# Patient Record
Sex: Male | Born: 1945 | Race: White | Hispanic: No | Smoking: Former smoker
Health system: Southern US, Community
[De-identification: ages and names within clinical notes are randomized; demographics above are authoritative.]

## PROBLEM LIST (undated history)

## (undated) DIAGNOSIS — F32A Depression, unspecified: Secondary | ICD-10-CM

## (undated) DIAGNOSIS — E119 Type 2 diabetes mellitus without complications: Secondary | ICD-10-CM

## (undated) DIAGNOSIS — E039 Hypothyroidism, unspecified: Secondary | ICD-10-CM

## (undated) DIAGNOSIS — F329 Major depressive disorder, single episode, unspecified: Secondary | ICD-10-CM

## (undated) DIAGNOSIS — E785 Hyperlipidemia, unspecified: Secondary | ICD-10-CM

## (undated) DIAGNOSIS — R0602 Shortness of breath: Secondary | ICD-10-CM

## (undated) DIAGNOSIS — L089 Local infection of the skin and subcutaneous tissue, unspecified: Secondary | ICD-10-CM

## (undated) DIAGNOSIS — I10 Essential (primary) hypertension: Secondary | ICD-10-CM

## (undated) DIAGNOSIS — G4730 Sleep apnea, unspecified: Secondary | ICD-10-CM

## (undated) HISTORY — PX: KNEE ARTHROSCOPY: SUR90

## (undated) HISTORY — PX: ROTATOR CUFF REPAIR: SHX139

---
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
## 1999-03-07 ENCOUNTER — Emergency Department (HOSPITAL_COMMUNITY): Admission: EM | Admit: 1999-03-07 | Discharge: 1999-03-07 | Payer: Self-pay | Admitting: Emergency Medicine

## 1999-03-07 ENCOUNTER — Encounter: Payer: Self-pay | Admitting: Emergency Medicine

## 2003-03-10 ENCOUNTER — Ambulatory Visit: Admission: RE | Admit: 2003-03-10 | Discharge: 2003-03-10 | Payer: Self-pay | Admitting: Pulmonary Disease

## 2003-05-01 ENCOUNTER — Ambulatory Visit (HOSPITAL_COMMUNITY): Admission: RE | Admit: 2003-05-01 | Discharge: 2003-05-01 | Payer: Self-pay | Admitting: Cardiovascular Disease

## 2004-10-09 ENCOUNTER — Inpatient Hospital Stay (HOSPITAL_COMMUNITY): Admission: EM | Admit: 2004-10-09 | Discharge: 2004-10-10 | Payer: Self-pay | Admitting: Emergency Medicine

## 2004-10-10 ENCOUNTER — Ambulatory Visit: Payer: Self-pay | Admitting: Cardiology

## 2012-11-14 ENCOUNTER — Encounter (HOSPITAL_COMMUNITY): Payer: Self-pay | Admitting: Pharmacy Technician

## 2012-11-19 ENCOUNTER — Other Ambulatory Visit: Payer: Self-pay

## 2012-11-19 ENCOUNTER — Encounter (HOSPITAL_COMMUNITY)
Admission: RE | Admit: 2012-11-19 | Discharge: 2012-11-19 | Disposition: A | Discharge status: Home / Self Care | Payer: Medicare Other | Source: Ambulatory Visit | Attending: Ophthalmology | Admitting: Ophthalmology

## 2012-11-19 ENCOUNTER — Encounter (HOSPITAL_COMMUNITY): Payer: Self-pay

## 2012-11-19 HISTORY — DX: Sleep apnea, unspecified: G47.30

## 2012-11-19 HISTORY — DX: Hypothyroidism, unspecified: E03.9

## 2012-11-19 HISTORY — DX: Essential (primary) hypertension: I10

## 2012-11-19 HISTORY — DX: Major depressive disorder, single episode, unspecified: F32.9

## 2012-11-19 HISTORY — DX: Depression, unspecified: F32.A

## 2012-11-19 HISTORY — DX: Hyperlipidemia, unspecified: E78.5

## 2012-11-19 HISTORY — DX: Shortness of breath: R06.02

## 2012-11-19 HISTORY — DX: Type 2 diabetes mellitus without complications: E11.9

## 2012-11-19 LAB — BASIC METABOLIC PANEL
CO2: 26 mEq/L (ref 19–32)
Chloride: 101 mEq/L (ref 96–112)
Sodium: 138 mEq/L (ref 135–145)

## 2012-11-19 LAB — HEMOGLOBIN AND HEMATOCRIT, BLOOD: HCT: 39.1 % (ref 39.0–52.0)

## 2012-11-19 NOTE — Patient Instructions (Signed)
Your procedure is scheduled on:  11/21/12  Report to [facility] at 12:30 PM.  Call this number if you have problems the morning of surgery: [phone]   Remember:   Do not eat or drink:After Midnight.  Take these medicines the morning of surgery with A SIP OF WATER: Levothyroxine, Paroxetine, Amlodipine and Losartan. TAKE ONLY HALF OF YOUR LANTUS THE NIGHT BEORE SURGERY.   Do not wear jewelry, make-up or nail polish.  Do not wear lotions, powders, or perfumes. You may wear deodorant.  Do not shave 48 hours prior to surgery. Men may shave face and neck.  Do not bring valuables to the hospital.  Contacts, dentures or bridgework may not be worn into surgery.  Leave suitcase in the car. After surgery it may be brought to your room.  For patients admitted to the hospital, checkout time is 11:00 AM the day of discharge.   Patients discharged the day of surgery will not be allowed to drive home.    Special Instructions: Start using your eye drops before surgery as directed by your eye doctor.   Please read over the following fact sheets that you were given: Anesthesia Post-op Instructions    Cataract Surgery  A cataract is a clouding of the lens of the eye. When a lens becomes cloudy, vision is reduced based on the degree and nature of the clouding. Surgery may be needed to improve vision. Surgery removes the cloudy lens and usually replaces it with a substitute lens (intraocular lens, IOL). LET YOUR EYE DOCTOR KNOW ABOUT:  Allergies to food or medicine.  Medicines taken including herbs, eyedrops, over-the-counter medicines, and creams.  Use of steroids (by mouth or creams).  Previous problems with anesthetics or numbing medicine.  History of bleeding problems or blood clots.  Previous surgery.  Other health problems, including diabetes and kidney problems.  Possibility of pregnancy, if this applies. RISKS AND COMPLICATIONS  Infection.  Inflammation of the eyeball  (endophthalmitis) that can spread to both eyes (sympathetic ophthalmia).  Poor wound healing.  If an IOL is inserted, it can later fall out of proper position. This is very uncommon.  Clouding of the part of your eye that holds an IOL in place. This is called an "after-cataract." These are uncommon, but easily treated. BEFORE THE PROCEDURE  Do not eat or drink anything except small amounts of water for 8 to 12 before your surgery, or as directed by your caregiver.  Unless you are told otherwise, continue any eyedrops you have been prescribed.  Talk to your primary caregiver about all other medicines that you take (both prescription and non-prescription). In some cases, you may need to stop or change medicines near the time of your surgery. This is most important if you are taking blood-thinning medicine.Do not stop medicines unless you are told to do so.  Arrange for someone to drive you to and from the procedure.  Do not put contact lenses in either eye on the day of your surgery. PROCEDURE There is more than one method for safely removing a cataract. Your doctor can explain the differences and help determine which is best for you. Phacoemulsification surgery is the most common form of cataract surgery.  An injection is given behind the eye or eyedrops are given to make this a painless procedure.  A small cut (incision) is made on the edge of the clear, dome-shaped surface that covers the front of the eye (cornea).  A tiny probe is painlessly inserted into the  eye. This device gives off ultrasound waves that soften and break up the cloudy center of the lens. This makes it easier for the cloudy lens to be removed by suction.  An IOL may be implanted.  The normal lens of the eye is covered by a clear capsule. Part of that capsule is intentionally left in the eye to support the IOL.  Your surgeon may or may not use stitches to close the incision. There are other forms of cataract  surgery that require a larger incision and stiches to close the eye. This approach is taken in cases where the doctor feels that the cataract cannot be easily removed using phacoemulsification. AFTER THE PROCEDURE  When an IOL is implanted, it does not need care. It becomes a permanent part of your eye and cannot be seen or felt.  Your doctor will schedule follow-up exams to check on your progress.  Review your other medicines with your doctor to see which can be resumed after surgery.  Use eyedrops or take medicine as prescribed by your doctor. Document Released: 02/23/2011 Document Revised: 05/29/2011 Document Reviewed: 02/23/2011 [facility] Patient Information 2013 [place], [place].    PATIENT INSTRUCTIONS POST-ANESTHESIA  IMMEDIATELY FOLLOWING SURGERY:  Do not drive or operate machinery for the first twenty four hours after surgery.  Do not make any important decisions for twenty four hours after surgery or while taking narcotic pain medications or sedatives.  If you develop intractable nausea and vomiting or a severe headache please notify your doctor immediately.  FOLLOW-UP:  Please make an appointment with your surgeon as instructed. You do not need to follow up with anesthesia unless specifically instructed to do so.  WOUND CARE INSTRUCTIONS (if applicable):  Keep a dry clean dressing on the anesthesia/puncture wound site if there is drainage.  Once the wound has quit draining you may leave it open to air.  Generally you should leave the bandage intact for twenty four hours unless there is drainage.  If the epidural site drains for more than 36-48 hours please call the anesthesia department.  QUESTIONS?:  Please feel free to call your physician or the hospital operator if you have any questions, and they will be happy to assist you.

## 2012-11-20 MED ORDER — LIDOCAINE HCL (PF) 1 % IJ SOLN
INTRAMUSCULAR | Status: AC
  Filled 2012-11-20: qty 2

## 2012-11-20 MED ORDER — LIDOCAINE HCL 3.5 % OP GEL
OPHTHALMIC | Status: AC
  Filled 2012-11-20: qty 5

## 2012-11-20 MED ORDER — CYCLOPENTOLATE-PHENYLEPHRINE OP SOLN OPTIME - NO CHARGE
OPHTHALMIC | Status: AC
  Filled 2012-11-20: qty 2

## 2012-11-20 MED ORDER — NEOMYCIN-POLYMYXIN-DEXAMETH 3.5-10000-0.1 OP OINT
TOPICAL_OINTMENT | OPHTHALMIC | Status: AC
  Filled 2012-11-20: qty 3.5

## 2012-11-20 MED ORDER — TETRACAINE HCL 0.5 % OP SOLN
OPHTHALMIC | Status: AC
  Filled 2012-11-20: qty 2

## 2012-11-21 ENCOUNTER — Encounter (HOSPITAL_COMMUNITY): Payer: Self-pay | Admitting: *Deleted

## 2012-11-21 ENCOUNTER — Ambulatory Visit (HOSPITAL_COMMUNITY): Payer: Medicare Other | Admitting: Anesthesiology

## 2012-11-21 ENCOUNTER — Encounter (HOSPITAL_COMMUNITY): Payer: Self-pay | Admitting: Anesthesiology

## 2012-11-21 ENCOUNTER — Ambulatory Visit (HOSPITAL_COMMUNITY)
Admission: RE | Admit: 2012-11-21 | Discharge: 2012-11-21 | Disposition: A | Discharge status: Home / Self Care | Payer: Medicare Other | Source: Ambulatory Visit | Attending: Ophthalmology | Admitting: Ophthalmology

## 2012-11-21 ENCOUNTER — Encounter (HOSPITAL_COMMUNITY)
Admission: RE | Disposition: A | Discharge status: Home / Self Care | Payer: Self-pay | Source: Ambulatory Visit | Attending: Ophthalmology

## 2012-11-21 DIAGNOSIS — Z01812 Encounter for preprocedural laboratory examination: Secondary | ICD-10-CM | POA: Insufficient documentation

## 2012-11-21 DIAGNOSIS — Z01810 Encounter for preprocedural cardiovascular examination: Secondary | ICD-10-CM | POA: Insufficient documentation

## 2012-11-21 DIAGNOSIS — Z794 Long term (current) use of insulin: Secondary | ICD-10-CM | POA: Insufficient documentation

## 2012-11-21 DIAGNOSIS — I10 Essential (primary) hypertension: Secondary | ICD-10-CM | POA: Insufficient documentation

## 2012-11-21 DIAGNOSIS — E119 Type 2 diabetes mellitus without complications: Secondary | ICD-10-CM | POA: Insufficient documentation

## 2012-11-21 DIAGNOSIS — H2589 Other age-related cataract: Secondary | ICD-10-CM | POA: Insufficient documentation

## 2012-11-21 HISTORY — PX: CATARACT EXTRACTION W/PHACO: SHX586

## 2012-11-21 SURGERY — PHACOEMULSIFICATION, CATARACT, WITH IOL INSERTION
Anesthesia: Monitor Anesthesia Care | Site: Eye | Laterality: Right | Wound class: Clean

## 2012-11-21 MED ORDER — FENTANYL CITRATE 0.05 MG/ML IJ SOLN
INTRAMUSCULAR | Status: AC
  Filled 2012-11-21: qty 2

## 2012-11-21 MED ORDER — EPINEPHRINE HCL 1 MG/ML IJ SOLN
INTRAOCULAR | Status: DC | PRN
  Administered 2012-11-21: 12:00:00

## 2012-11-21 MED ORDER — POVIDONE-IODINE 5 % OP SOLN
OPHTHALMIC | Status: DC | PRN
  Administered 2012-11-21: 1 via OPHTHALMIC

## 2012-11-21 MED ORDER — CYCLOPENTOLATE-PHENYLEPHRINE 0.2-1 % OP SOLN
1.0000 [drp] | OPHTHALMIC | Status: AC
  Administered 2012-11-21 (×3): 1 [drp] via OPHTHALMIC

## 2012-11-21 MED ORDER — LIDOCAINE HCL 3.5 % OP GEL
1.0000 "application " | Freq: Once | OPHTHALMIC | Status: AC
  Administered 2012-11-21: 1 via OPHTHALMIC

## 2012-11-21 MED ORDER — PHENYLEPHRINE HCL 2.5 % OP SOLN
1.0000 [drp] | OPHTHALMIC | Status: AC
  Administered 2012-11-21 (×3): 1 [drp] via OPHTHALMIC

## 2012-11-21 MED ORDER — LIDOCAINE HCL (PF) 1 % IJ SOLN
INTRAMUSCULAR | Status: DC | PRN
  Administered 2012-11-21: .6 mL

## 2012-11-21 MED ORDER — MIDAZOLAM HCL 2 MG/2ML IJ SOLN
INTRAMUSCULAR | Status: AC
  Filled 2012-11-21: qty 2

## 2012-11-21 MED ORDER — NEOMYCIN-POLYMYXIN-DEXAMETH 0.1 % OP OINT
TOPICAL_OINTMENT | OPHTHALMIC | Status: DC | PRN
  Administered 2012-11-21: 1 via OPHTHALMIC

## 2012-11-21 MED ORDER — BSS IO SOLN
INTRAOCULAR | Status: DC | PRN
  Administered 2012-11-21: 15 mL via INTRAOCULAR

## 2012-11-21 MED ORDER — MIDAZOLAM HCL 2 MG/2ML IJ SOLN
1.0000 mg | INTRAMUSCULAR | Status: DC | PRN
  Administered 2012-11-21: 2 mg via INTRAVENOUS

## 2012-11-21 MED ORDER — LIDOCAINE 3.5 % OP GEL OPTIME - NO CHARGE
OPHTHALMIC | Status: DC | PRN
  Administered 2012-11-21: 1 [drp] via OPHTHALMIC

## 2012-11-21 MED ORDER — PROVISC 10 MG/ML IO SOLN
INTRAOCULAR | Status: DC | PRN
  Administered 2012-11-21: 8.5 mg via INTRAOCULAR

## 2012-11-21 MED ORDER — TETRACAINE HCL 0.5 % OP SOLN
1.0000 [drp] | OPHTHALMIC | Status: AC
  Administered 2012-11-21 (×3): 1 [drp] via OPHTHALMIC

## 2012-11-21 MED ORDER — LACTATED RINGERS IV SOLN
INTRAVENOUS | Status: DC
  Administered 2012-11-21: 1000 mL via INTRAVENOUS

## 2012-11-21 MED ORDER — FENTANYL CITRATE 0.05 MG/ML IJ SOLN: 25.0000 ug | INTRAMUSCULAR | Status: DC | PRN

## 2012-11-21 MED ORDER — ONDANSETRON HCL 4 MG/2ML IJ SOLN: 4.0000 mg | Freq: Once | INTRAMUSCULAR | Status: DC | PRN

## 2012-11-21 MED ORDER — FENTANYL CITRATE 0.05 MG/ML IJ SOLN
25.0000 ug | INTRAMUSCULAR | Status: AC
  Administered 2012-11-21: 25 ug via INTRAVENOUS
  Administered 2012-11-21: 11:00:00 via INTRAVENOUS

## 2012-11-21 MED ORDER — EPINEPHRINE HCL 1 MG/ML IJ SOLN
INTRAMUSCULAR | Status: AC
  Filled 2012-11-21: qty 1

## 2012-11-21 SURGICAL SUPPLY — 32 items
CAPSULAR TENSION RING-AMO (OPHTHALMIC RELATED) IMPLANT
CLOTH BEACON ORANGE TIMEOUT ST (SAFETY) ×1 IMPLANT
EYE SHIELD UNIVERSAL CLEAR (GAUZE/BANDAGES/DRESSINGS) ×1 IMPLANT
GLOVE BIO SURGEON STRL SZ 6.5 (GLOVE) IMPLANT
GLOVE BIOGEL PI IND STRL 6.5 (GLOVE) IMPLANT
GLOVE BIOGEL PI IND STRL 7.0 (GLOVE) IMPLANT
GLOVE BIOGEL PI IND STRL 7.5 (GLOVE) IMPLANT
GLOVE BIOGEL PI INDICATOR 6.5 (GLOVE) ×1
GLOVE BIOGEL PI INDICATOR 7.0 (GLOVE)
GLOVE BIOGEL PI INDICATOR 7.5 (GLOVE)
GLOVE ECLIPSE 6.5 STRL STRAW (GLOVE) ×1 IMPLANT
GLOVE ECLIPSE 7.0 STRL STRAW (GLOVE) IMPLANT
GLOVE ECLIPSE 7.5 STRL STRAW (GLOVE) IMPLANT
GLOVE EXAM NITRILE LRG STRL (GLOVE) IMPLANT
GLOVE EXAM NITRILE MD LF STRL (GLOVE) IMPLANT
GLOVE SKINSENSE NS SZ6.5 (GLOVE)
GLOVE SKINSENSE NS SZ7.0 (GLOVE)
GLOVE SKINSENSE STRL SZ6.5 (GLOVE) IMPLANT
GLOVE SKINSENSE STRL SZ7.0 (GLOVE) IMPLANT
KIT VITRECTOMY (OPHTHALMIC RELATED) IMPLANT
PAD ARMBOARD 7.5X6 YLW CONV (MISCELLANEOUS) ×1 IMPLANT
PROC W NO LENS (INTRAOCULAR LENS)
PROC W SPEC LENS (INTRAOCULAR LENS)
PROCESS W NO LENS (INTRAOCULAR LENS) IMPLANT
PROCESS W SPEC LENS (INTRAOCULAR LENS) IMPLANT
RING MALYGIN (MISCELLANEOUS) IMPLANT
SIGHTPATH CAT PROC W REG LENS (Ophthalmic Related) ×2 IMPLANT
SYR TB 1ML LL NO SAFETY (SYRINGE) ×1 IMPLANT
TAPE SURG TRANSPORE 1 IN (GAUZE/BANDAGES/DRESSINGS) IMPLANT
TAPE SURGICAL TRANSPORE 1 IN (GAUZE/BANDAGES/DRESSINGS) ×1
VISCOELASTIC ADDITIONAL (OPHTHALMIC RELATED) IMPLANT
WATER STERILE IRR 250ML POUR (IV SOLUTION) ×1 IMPLANT

## 2012-11-21 NOTE — Op Note (Signed)
Date of Admission: 11/21/2012  Date of Surgery: 11/21/2012  Pre-Op Dx: Cataract  Right  Eye  Post-Op Dx: Combined Cataract  Right  Eye,  Dx Code 366.19  Surgeon: Gemma Payor, M.D.  Assistants: None  Anesthesia: Topical with MAC  Indications: Painless, progressive loss of vision with compromise of daily activities.  Surgery: Cataract Extraction with Intraocular lens Implant Right Eye  Discription: The patient had dilating drops and viscous lidocaine placed into the left eye in the pre-op holding area. After transfer to the operating room, a time out was performed. The patient was then prepped and draped. Beginning with a 75 degree blade a paracentesis port was made at the surgeon's 2 o'clock position. The anterior chamber was then filled with 1% non-preserved lidocaine. This was followed by filling the anterior chamber with Provisc.  A 2.34mm keratome blade was used to make a clear corneal incision at the temporal limbus.  A bent cystatome needle was used to create a continuous tear capsulotomy. Hydrodissection was performed with balanced salt solution on a Fine canula. The lens nucleus was then removed using the phacoemulsification handpiece. Residual cortex was removed with the I&A handpiece. The anterior chamber and capsular bag were refilled with Provisc. A posterior chamber intraocular lens was placed into the capsular bag with it's injector. The implant was positioned with the Kuglan hook. The Provisc was then removed from the anterior chamber and capsular bag with the I&A handpiece. Stromal hydration of the main incision and paracentesis port was performed with BSS on a Fine canula. The wounds were tested for leak which was negative. The patient tolerated the procedure well. There were no operative complications. The patient was then transferred to the recovery room in stable condition.  Complications: None  Specimen: None  EBL: None  Prosthetic device: B&L enVista, MX60, power 15.5D, SN  [id number].

## 2012-11-21 NOTE — Transfer of Care (Signed)
Immediate Anesthesia Transfer of Care Note  Patient: Ronald Heath  Procedure(s) Performed: Procedure(s) with comments: CATARACT EXTRACTION PHACO AND INTRAOCULAR LENS PLACEMENT (IOC) (Right) - CDE:19.67  Patient Location: PACU and Short Stay  Anesthesia Type:MAC  Level of Consciousness: awake, alert  and oriented  Airway & Oxygen Therapy: Patient Spontanous Breathing  Post-op Assessment: Report given to PACU RN  Post vital signs: Reviewed  Complications: No apparent anesthesia complications

## 2012-11-21 NOTE — H&P (Signed)
I have reviewed the H&P, the patient was re-examined, and I have identified no interval changes in medical condition and plan of care since the history and physical of record  

## 2012-11-21 NOTE — Anesthesia Postprocedure Evaluation (Signed)
  Anesthesia Post-op Note  Patient: Ronald Heath  Procedure(s) Performed: Procedure(s) with comments: CATARACT EXTRACTION PHACO AND INTRAOCULAR LENS PLACEMENT (IOC) (Right) - CDE:19.67  Patient Location: PACU and Short Stay  Anesthesia Type:MAC  Level of Consciousness: awake, alert  and oriented  Airway and Oxygen Therapy: Patient Spontanous Breathing  Post-op Pain: none  Post-op Assessment: Post-op Vital signs reviewed, Patient's Cardiovascular Status Stable, Respiratory Function Stable, Patent Airway and No signs of Nausea or vomiting  Post-op Vital Signs: Reviewed and stable  Complications: No apparent anesthesia complications

## 2012-11-21 NOTE — Anesthesia Preprocedure Evaluation (Signed)
Anesthesia Evaluation  Patient identified by MRN, date of birth, ID band Patient awake    Reviewed: Allergy & Precautions, H&P , NPO status , Patient's Chart, lab work & pertinent test results  Airway Mallampati: II TM Distance: >3 FB     Dental  (+) Teeth Intact   Pulmonary shortness of breath, sleep apnea ,  breath sounds clear to auscultation        Cardiovascular hypertension, Pt. on medications Rhythm:Regular Rate:Normal     Neuro/Psych PSYCHIATRIC DISORDERS Depression    GI/Hepatic   Endo/Other  diabetes, Type 2, Insulin Dependent and Oral Hypoglycemic AgentsHypothyroidism Morbid obesity  Renal/GU      Musculoskeletal   Abdominal   Peds  Hematology   Anesthesia Other Findings   Reproductive/Obstetrics                           Anesthesia Physical Anesthesia Plan  ASA: III  Anesthesia Plan: MAC   Post-op Pain Management:    Induction: Intravenous  Airway Management Planned: Nasal Cannula  Additional Equipment:   Intra-op Plan:   Post-operative Plan: Extubation in OR  Informed Consent:   Plan Discussed with:   Anesthesia Plan Comments:         Anesthesia Quick Evaluation

## 2012-11-22 ENCOUNTER — Encounter (HOSPITAL_COMMUNITY): Payer: Self-pay | Admitting: Ophthalmology

## 2012-11-22 LAB — GLUCOSE, CAPILLARY: Glucose-Capillary: 114 mg/dL — ABNORMAL HIGH (ref 70–99)

## 2012-12-09 ENCOUNTER — Encounter (HOSPITAL_COMMUNITY): Payer: Self-pay | Admitting: Pharmacy Technician

## 2012-12-13 ENCOUNTER — Encounter (HOSPITAL_COMMUNITY)
Admission: RE | Admit: 2012-12-13 | Discharge: 2012-12-13 | Disposition: A | Discharge status: Home / Self Care | Payer: Medicare Other | Source: Ambulatory Visit | Attending: Ophthalmology | Admitting: Ophthalmology

## 2012-12-18 MED ORDER — LIDOCAINE HCL (PF) 1 % IJ SOLN
INTRAMUSCULAR | Status: AC
  Filled 2012-12-18: qty 2

## 2012-12-18 MED ORDER — LIDOCAINE HCL 3.5 % OP GEL
OPHTHALMIC | Status: AC
  Filled 2012-12-18: qty 1

## 2012-12-18 MED ORDER — NEOMYCIN-POLYMYXIN-DEXAMETH 3.5-10000-0.1 OP SUSP
OPHTHALMIC | Status: AC
  Filled 2012-12-18: qty 5

## 2012-12-18 MED ORDER — TETRACAINE HCL 0.5 % OP SOLN
OPHTHALMIC | Status: AC
  Filled 2012-12-18: qty 2

## 2012-12-18 MED ORDER — CYCLOPENTOLATE-PHENYLEPHRINE OP SOLN OPTIME - NO CHARGE
OPHTHALMIC | Status: AC
  Filled 2012-12-18: qty 2

## 2012-12-19 ENCOUNTER — Ambulatory Visit (HOSPITAL_COMMUNITY)
Admission: RE | Admit: 2012-12-19 | Discharge: 2012-12-19 | Disposition: A | Discharge status: Home / Self Care | Payer: Medicare Other | Source: Ambulatory Visit | Attending: Ophthalmology | Admitting: Ophthalmology

## 2012-12-19 ENCOUNTER — Ambulatory Visit (HOSPITAL_COMMUNITY): Payer: Medicare Other | Admitting: Anesthesiology

## 2012-12-19 ENCOUNTER — Encounter (HOSPITAL_COMMUNITY): Payer: Self-pay | Admitting: *Deleted

## 2012-12-19 ENCOUNTER — Encounter (HOSPITAL_COMMUNITY)
Admission: RE | Disposition: A | Discharge status: Home / Self Care | Payer: Self-pay | Source: Ambulatory Visit | Attending: Ophthalmology

## 2012-12-19 ENCOUNTER — Encounter (HOSPITAL_COMMUNITY): Payer: Self-pay | Admitting: Anesthesiology

## 2012-12-19 DIAGNOSIS — Z01812 Encounter for preprocedural laboratory examination: Secondary | ICD-10-CM | POA: Insufficient documentation

## 2012-12-19 DIAGNOSIS — H2589 Other age-related cataract: Secondary | ICD-10-CM | POA: Insufficient documentation

## 2012-12-19 DIAGNOSIS — Z794 Long term (current) use of insulin: Secondary | ICD-10-CM | POA: Insufficient documentation

## 2012-12-19 DIAGNOSIS — I10 Essential (primary) hypertension: Secondary | ICD-10-CM | POA: Insufficient documentation

## 2012-12-19 DIAGNOSIS — E119 Type 2 diabetes mellitus without complications: Secondary | ICD-10-CM | POA: Insufficient documentation

## 2012-12-19 HISTORY — PX: CATARACT EXTRACTION W/PHACO: SHX586

## 2012-12-19 LAB — GLUCOSE, CAPILLARY: Glucose-Capillary: 101 mg/dL — ABNORMAL HIGH (ref 70–99)

## 2012-12-19 SURGERY — PHACOEMULSIFICATION, CATARACT, WITH IOL INSERTION
Anesthesia: Monitor Anesthesia Care | Site: Eye | Laterality: Left | Wound class: Clean

## 2012-12-19 MED ORDER — FENTANYL CITRATE 0.05 MG/ML IJ SOLN
INTRAMUSCULAR | Status: AC
  Administered 2012-12-19: 25 ug via INTRAVENOUS
  Filled 2012-12-19: qty 2

## 2012-12-19 MED ORDER — LACTATED RINGERS IV SOLN
INTRAVENOUS | Status: DC | PRN
  Administered 2012-12-19: 11:00:00 via INTRAVENOUS

## 2012-12-19 MED ORDER — CYCLOPENTOLATE-PHENYLEPHRINE 0.2-1 % OP SOLN
1.0000 [drp] | OPHTHALMIC | Status: AC
  Administered 2012-12-19 (×3): 1 [drp] via OPHTHALMIC

## 2012-12-19 MED ORDER — LIDOCAINE HCL (PF) 1 % IJ SOLN
INTRAMUSCULAR | Status: DC | PRN
  Administered 2012-12-19: .5 mL

## 2012-12-19 MED ORDER — EPINEPHRINE HCL 1 MG/ML IJ SOLN
INTRAMUSCULAR | Status: AC
  Filled 2012-12-19: qty 1

## 2012-12-19 MED ORDER — PHENYLEPHRINE HCL 2.5 % OP SOLN
OPHTHALMIC | Status: AC
  Filled 2012-12-19: qty 15

## 2012-12-19 MED ORDER — FENTANYL CITRATE 0.05 MG/ML IJ SOLN
25.0000 ug | INTRAMUSCULAR | Status: AC
  Administered 2012-12-19 (×2): 25 ug via INTRAVENOUS

## 2012-12-19 MED ORDER — MIDAZOLAM HCL 2 MG/2ML IJ SOLN
1.0000 mg | INTRAMUSCULAR | Status: DC | PRN
  Administered 2012-12-19: 2 mg via INTRAVENOUS

## 2012-12-19 MED ORDER — LIDOCAINE 3.5 % OP GEL OPTIME - NO CHARGE
OPHTHALMIC | Status: DC | PRN
  Administered 2012-12-19: 1 [drp] via OPHTHALMIC

## 2012-12-19 MED ORDER — EPINEPHRINE HCL 1 MG/ML IJ SOLN
INTRAOCULAR | Status: DC | PRN
  Administered 2012-12-19: 12:00:00

## 2012-12-19 MED ORDER — POVIDONE-IODINE 5 % OP SOLN
OPHTHALMIC | Status: DC | PRN
  Administered 2012-12-19: 1 via OPHTHALMIC

## 2012-12-19 MED ORDER — PROVISC 10 MG/ML IO SOLN
INTRAOCULAR | Status: DC | PRN
Start: 2012-12-19 — End: 2012-12-19
  Administered 2012-12-19: 8.5 mg via INTRAOCULAR

## 2012-12-19 MED ORDER — NEOMYCIN-POLYMYXIN-DEXAMETH 0.1 % OP OINT
TOPICAL_OINTMENT | OPHTHALMIC | Status: DC | PRN
  Administered 2012-12-19: 1 via OPHTHALMIC

## 2012-12-19 MED ORDER — BSS IO SOLN
INTRAOCULAR | Status: DC | PRN
  Administered 2012-12-19: 15 mL via INTRAOCULAR

## 2012-12-19 MED ORDER — TETRACAINE HCL 0.5 % OP SOLN
1.0000 [drp] | OPHTHALMIC | Status: AC
  Administered 2012-12-19 (×3): 1 [drp] via OPHTHALMIC

## 2012-12-19 MED ORDER — PHENYLEPHRINE HCL 2.5 % OP SOLN
1.0000 [drp] | OPHTHALMIC | Status: AC
Start: 2012-12-19 — End: 2012-12-19
  Administered 2012-12-19 (×3): 1 [drp] via OPHTHALMIC

## 2012-12-19 MED ORDER — LACTATED RINGERS IV SOLN
INTRAVENOUS | Status: DC
  Administered 2012-12-19: 1000 mL via INTRAVENOUS

## 2012-12-19 MED ORDER — LIDOCAINE HCL 3.5 % OP GEL: 1.0000 "application " | Freq: Once | OPHTHALMIC | Status: DC

## 2012-12-19 MED ORDER — MIDAZOLAM HCL 2 MG/2ML IJ SOLN
INTRAMUSCULAR | Status: AC
  Filled 2012-12-19: qty 2

## 2012-12-19 SURGICAL SUPPLY — 32 items

## 2012-12-19 NOTE — Anesthesia Procedure Notes (Signed)
Procedure Name: MAC Date/Time: 12/19/2012 11:41 AM Performed by: Carolyne Littles, Leighla Chestnutt L Pre-anesthesia Checklist: Patient identified, Timeout performed, Emergency Drugs available, Suction available and Patient being monitored Oxygen Delivery Method: Nasal cannula

## 2012-12-19 NOTE — Op Note (Signed)
Date of Admission: 12/19/2012  Date of Surgery: 12/19/2012  Pre-Op Dx: Cataract  Left  Eye  Post-Op Dx: Combined Cataract  Left  Eye,  Dx Code 366.19  Surgeon: Gemma Payor, M.D.  Assistants: None  Anesthesia: Topical with MAC  Indications: Painless, progressive loss of vision with compromise of daily activities.  Surgery: Cataract Extraction with Intraocular lens Implant Left Eye  Discription: The patient had dilating drops and viscous lidocaine placed into the left eye in the pre-op holding area. After transfer to the operating room, a time out was performed. The patient was then prepped and draped. Beginning with a 75 degree blade a paracentesis port was made at the surgeon's 2 o'clock position. The anterior chamber was then filled with 1% non-preserved lidocaine. This was followed by filling the anterior chamber with Provisc. A 2.61mm keratome blade was used to make a clear corneal incision at the temporal limbus. A bent cystatome needle was used to create a continuous tear capsulotomy. Hydrodissection was performed with balanced salt solution on a Fine canula. The lens nucleus was then removed using the phacoemulsification handpiece. Residual cortex was removed with the I&A handpiece. The anterior chamber and capsular bag were refilled with Provisc. A posterior chamber intraocular lens was placed into the capsular bag with it's injector. The implant was positioned with the Kuglan hook. The Provisc was then removed from the anterior chamber and capsular bag with the I&A handpiece. Stromal hydration of the main incision and paracentesis port was performed with BSS on a Fine canula. The wounds were tested for leak which was negative. The patient tolerated the procedure well. There were no operative complications. The patient was then transferred to the recovery room in stable condition.  Complications: None  Specimen: None  EBL: None  Prosthetic device: B&L enVista, MX60, power 15.5D, SN  [id number].

## 2012-12-19 NOTE — Transfer of Care (Signed)
Immediate Anesthesia Transfer of Care Note  Patient: Ronald Heath  Procedure(s) Performed: Procedure(s) with comments: CATARACT EXTRACTION PHACO AND INTRAOCULAR LENS PLACEMENT (IOC) (Left) - CDE:8.00  Patient Location: Short Stay  Anesthesia Type:MAC  Level of Consciousness: awake, alert , oriented and patient cooperative  Airway & Oxygen Therapy: Patient Spontanous Breathing  Post-op Assessment: Report given to PACU RN and Post -op Vital signs reviewed and stable  Post vital signs: Reviewed and stable  Complications: No apparent anesthesia complications

## 2012-12-19 NOTE — Preoperative (Signed)
Beta Blockers   Reason not to administer Beta Blockers:Not Applicable 

## 2012-12-19 NOTE — Anesthesia Preprocedure Evaluation (Signed)
Anesthesia Evaluation  Patient identified by MRN, date of birth, ID band Patient awake    Reviewed: Allergy & Precautions, H&P , NPO status , Patient's Chart, lab work & pertinent test results  Airway Mallampati: II TM Distance: >3 FB     Dental  (+) Teeth Intact   Pulmonary shortness of breath, sleep apnea ,  breath sounds clear to auscultation        Cardiovascular hypertension, Pt. on medications Rhythm:Regular Rate:Normal     Neuro/Psych PSYCHIATRIC DISORDERS Depression    GI/Hepatic   Endo/Other  diabetes, Type 2, Insulin Dependent and Oral Hypoglycemic AgentsHypothyroidism Morbid obesity  Renal/GU      Musculoskeletal   Abdominal   Peds  Hematology   Anesthesia Other Findings   Reproductive/Obstetrics                           Anesthesia Physical Anesthesia Plan  ASA: III  Anesthesia Plan: MAC   Post-op Pain Management:    Induction: Intravenous  Airway Management Planned: Nasal Cannula  Additional Equipment:   Intra-op Plan:   Post-operative Plan: Extubation in OR  Informed Consent:   Plan Discussed with:   Anesthesia Plan Comments:         Anesthesia Quick Evaluation

## 2012-12-19 NOTE — Anesthesia Postprocedure Evaluation (Signed)
  Anesthesia Post-op Note  Patient: Ronald Heath  Procedure(s) Performed: Procedure(s) with comments: CATARACT EXTRACTION PHACO AND INTRAOCULAR LENS PLACEMENT (IOC) (Left) - CDE:8.00  Patient Location: Short Stay  Anesthesia Type:MAC  Level of Consciousness: awake, alert , oriented and patient cooperative  Airway and Oxygen Therapy: Patient Spontanous Breathing  Post-op Pain: none  Post-op Assessment: Post-op Vital signs reviewed, Patient's Cardiovascular Status Stable, Respiratory Function Stable, Patent Airway and Pain level controlled  Post-op Vital Signs: Reviewed and stable  Complications: No apparent anesthesia complications

## 2012-12-19 NOTE — H&P (Signed)
I have reviewed the H&P, the patient was re-examined, and I have identified no interval changes in medical condition and plan of care since the history and physical of record  

## 2012-12-20 ENCOUNTER — Encounter (HOSPITAL_COMMUNITY): Payer: Self-pay | Admitting: Ophthalmology

## 2013-03-06 ENCOUNTER — Other Ambulatory Visit (HOSPITAL_COMMUNITY): Payer: Self-pay

## 2013-03-06 DIAGNOSIS — G4730 Sleep apnea, unspecified: Secondary | ICD-10-CM

## 2013-03-10 ENCOUNTER — Ambulatory Visit: Payer: Medicare Other | Attending: Pulmonary Disease | Admitting: Sleep Medicine

## 2013-03-10 DIAGNOSIS — G4733 Obstructive sleep apnea (adult) (pediatric): Secondary | ICD-10-CM | POA: Insufficient documentation

## 2013-03-10 DIAGNOSIS — G4730 Sleep apnea, unspecified: Secondary | ICD-10-CM

## 2013-03-10 DIAGNOSIS — Z6841 Body Mass Index (BMI) 40.0 and over, adult: Secondary | ICD-10-CM | POA: Insufficient documentation

## 2013-03-15 NOTE — Procedures (Signed)
[facility] Hillary Schwegler A. Gerilyn Pilgrim, MD     [email or web address]        NAME:  Ronald Heath, Ronald Heath NO.:  [id number]  MEDICAL RECORD NO.:  [id number]          PATIENT TYPE:  OUT  LOCATION:  SLEEP LAB                     FACILITY:  [facility]  PHYSICIAN:  Keelyn Monjaras A. Gerilyn Pilgrim, M.D. DATE OF BIRTH:  02/04/1946  DATE OF STUDY:                           NOCTURNAL POLYSOMNOGRAM  REFERRING PHYSICIAN:  Ramon Dredge L. Juanetta Gosling, M.D.  The patient is a 67 year old man who presents with hypersomnia, fatigue, obesity and snoring.   MEDICATIONS:  Paroxetine, Synthroid insulin, glipizide, doxycycline, hydrochlorothiazide, losartan, metformin, aspirin, pramipexole, Nasonex, amlodipine.  EPWORTH SLEEPINESS SCALE:  9.  BMI:  57.  ARCHITECTURAL SUMMARY:  This is a split night recording with the first part being a documentary and the second portion a titration recording. Total recording time is 414 minutes.  Sleep efficiency 64%.  Sleep latency 25 minutes.  REM latency 300 minutes.  RESPIRATORY SUMMARY:  Baseline oxygen saturation is 92.  Lowest saturation 83 during non-REM sleep with diagnostic AHI 62.  The patient was placed on positive pressure starting at 5 and increased at 13.  He did well of pressures of 10 and above.  Higher pressures tend to cause central event especially during REM sleep.  The patient is noted to have significant desaturation in the high 80s without obstructive events especially during REM sleep.  They are also seen in non-REM sleep, however.  LIMB MOVEMENT SUMMARY:  PLM index 0.  ELECTROCARDIOGRAM SUMMARY:  Average heart rate is 67 with no significant arrhythmias observed.  IMPRESSION: 1. Severe obstructive sleep apnea syndrome which responds adequately     to pressures between 10 and 13.  I recommend a pressure of 11. 2. Hypoventilation syndrome.  The patient should have an overnight     oximetry after he has been on positive pressure treatments for a  month or more to see if the hypoventilation persist.  Thanks for this referral.      Mahli Glahn A. Gerilyn Pilgrim, M.D.    KAD/MEDQ  D:  03/15/2013 19:32:21  T:  03/15/2013 19:55:12  Job:  [id number]

## 2015-01-07 ENCOUNTER — Telehealth: Payer: Self-pay

## 2015-01-07 NOTE — Telephone Encounter (Signed)
Pt was referred by Dr. Luan Pulling for a screening colonoscopy. LMOM for a return call.

## 2015-01-21 ENCOUNTER — Telehealth: Payer: Self-pay

## 2015-01-21 NOTE — Telephone Encounter (Signed)
PT was referred by Dr. Luan Pulling for a screening colonoscopy. I have called and LMOM for a return call.

## 2015-01-21 NOTE — Telephone Encounter (Signed)
PLEASE SEE OTHER Chart.

## 2015-02-04 NOTE — Telephone Encounter (Signed)
LM for pt to call

## 2015-02-17 NOTE — Telephone Encounter (Signed)
Mailing a letter to pt to call. Letter to PCP also.

## 2016-05-10 DIAGNOSIS — E11628 Type 2 diabetes mellitus with other skin complications: Secondary | ICD-10-CM | POA: Diagnosis not present

## 2016-05-10 DIAGNOSIS — E039 Hypothyroidism, unspecified: Secondary | ICD-10-CM | POA: Diagnosis not present

## 2016-05-10 DIAGNOSIS — E1142 Type 2 diabetes mellitus with diabetic polyneuropathy: Secondary | ICD-10-CM | POA: Insufficient documentation

## 2016-05-10 DIAGNOSIS — E782 Mixed hyperlipidemia: Secondary | ICD-10-CM | POA: Insufficient documentation

## 2016-05-10 DIAGNOSIS — G4730 Sleep apnea, unspecified: Secondary | ICD-10-CM | POA: Diagnosis not present

## 2016-05-10 DIAGNOSIS — E113599 Type 2 diabetes mellitus with proliferative diabetic retinopathy without macular edema, unspecified eye: Secondary | ICD-10-CM | POA: Diagnosis not present

## 2016-05-10 DIAGNOSIS — E1165 Type 2 diabetes mellitus with hyperglycemia: Secondary | ICD-10-CM | POA: Diagnosis not present

## 2016-05-10 DIAGNOSIS — I10 Essential (primary) hypertension: Secondary | ICD-10-CM | POA: Diagnosis not present

## 2016-05-10 DIAGNOSIS — E669 Obesity, unspecified: Secondary | ICD-10-CM | POA: Diagnosis not present

## 2016-05-10 DIAGNOSIS — G5602 Carpal tunnel syndrome, left upper limb: Secondary | ICD-10-CM | POA: Diagnosis not present

## 2016-05-10 DIAGNOSIS — G2581 Restless legs syndrome: Secondary | ICD-10-CM | POA: Diagnosis not present

## 2016-05-10 DIAGNOSIS — Z794 Long term (current) use of insulin: Secondary | ICD-10-CM | POA: Diagnosis not present

## 2016-05-17 DIAGNOSIS — H43813 Vitreous degeneration, bilateral: Secondary | ICD-10-CM | POA: Diagnosis not present

## 2016-05-17 DIAGNOSIS — H31091 Other chorioretinal scars, right eye: Secondary | ICD-10-CM | POA: Diagnosis not present

## 2016-05-17 DIAGNOSIS — E113313 Type 2 diabetes mellitus with moderate nonproliferative diabetic retinopathy with macular edema, bilateral: Secondary | ICD-10-CM | POA: Diagnosis not present

## 2016-05-18 DIAGNOSIS — E1165 Type 2 diabetes mellitus with hyperglycemia: Secondary | ICD-10-CM | POA: Diagnosis not present

## 2016-05-18 DIAGNOSIS — M1990 Unspecified osteoarthritis, unspecified site: Secondary | ICD-10-CM | POA: Diagnosis not present

## 2016-05-18 DIAGNOSIS — I10 Essential (primary) hypertension: Secondary | ICD-10-CM | POA: Diagnosis not present

## 2016-07-07 DIAGNOSIS — E113312 Type 2 diabetes mellitus with moderate nonproliferative diabetic retinopathy with macular edema, left eye: Secondary | ICD-10-CM | POA: Diagnosis not present

## 2016-07-14 DIAGNOSIS — G4733 Obstructive sleep apnea (adult) (pediatric): Secondary | ICD-10-CM | POA: Diagnosis not present

## 2016-07-18 DIAGNOSIS — E1142 Type 2 diabetes mellitus with diabetic polyneuropathy: Secondary | ICD-10-CM | POA: Diagnosis not present

## 2016-07-18 DIAGNOSIS — B351 Tinea unguium: Secondary | ICD-10-CM | POA: Diagnosis not present

## 2016-08-09 DIAGNOSIS — E039 Hypothyroidism, unspecified: Secondary | ICD-10-CM | POA: Diagnosis not present

## 2016-08-09 DIAGNOSIS — E782 Mixed hyperlipidemia: Secondary | ICD-10-CM | POA: Diagnosis not present

## 2016-08-09 DIAGNOSIS — Z794 Long term (current) use of insulin: Secondary | ICD-10-CM | POA: Diagnosis not present

## 2016-08-09 DIAGNOSIS — I10 Essential (primary) hypertension: Secondary | ICD-10-CM | POA: Diagnosis not present

## 2016-08-09 DIAGNOSIS — E1165 Type 2 diabetes mellitus with hyperglycemia: Secondary | ICD-10-CM | POA: Diagnosis not present

## 2016-08-16 DIAGNOSIS — E039 Hypothyroidism, unspecified: Secondary | ICD-10-CM | POA: Diagnosis not present

## 2016-08-16 DIAGNOSIS — I10 Essential (primary) hypertension: Secondary | ICD-10-CM | POA: Diagnosis not present

## 2016-08-16 DIAGNOSIS — E1165 Type 2 diabetes mellitus with hyperglycemia: Secondary | ICD-10-CM | POA: Diagnosis not present

## 2016-08-16 DIAGNOSIS — Z79899 Other long term (current) drug therapy: Secondary | ICD-10-CM | POA: Diagnosis not present

## 2016-08-16 DIAGNOSIS — Z794 Long term (current) use of insulin: Secondary | ICD-10-CM | POA: Diagnosis not present

## 2016-08-16 DIAGNOSIS — E1142 Type 2 diabetes mellitus with diabetic polyneuropathy: Secondary | ICD-10-CM | POA: Diagnosis not present

## 2016-08-16 DIAGNOSIS — E782 Mixed hyperlipidemia: Secondary | ICD-10-CM | POA: Diagnosis not present

## 2016-09-01 DIAGNOSIS — H31091 Other chorioretinal scars, right eye: Secondary | ICD-10-CM | POA: Diagnosis not present

## 2016-09-01 DIAGNOSIS — E113313 Type 2 diabetes mellitus with moderate nonproliferative diabetic retinopathy with macular edema, bilateral: Secondary | ICD-10-CM | POA: Diagnosis not present

## 2016-09-01 DIAGNOSIS — H43813 Vitreous degeneration, bilateral: Secondary | ICD-10-CM | POA: Diagnosis not present

## 2016-09-26 DIAGNOSIS — E1142 Type 2 diabetes mellitus with diabetic polyneuropathy: Secondary | ICD-10-CM | POA: Diagnosis not present

## 2016-09-26 DIAGNOSIS — B351 Tinea unguium: Secondary | ICD-10-CM | POA: Diagnosis not present

## 2016-10-19 DIAGNOSIS — E039 Hypothyroidism, unspecified: Secondary | ICD-10-CM | POA: Diagnosis not present

## 2016-10-19 DIAGNOSIS — M1711 Unilateral primary osteoarthritis, right knee: Secondary | ICD-10-CM | POA: Diagnosis not present

## 2016-10-19 DIAGNOSIS — I10 Essential (primary) hypertension: Secondary | ICD-10-CM | POA: Diagnosis not present

## 2016-10-19 DIAGNOSIS — E119 Type 2 diabetes mellitus without complications: Secondary | ICD-10-CM | POA: Diagnosis not present

## 2016-11-07 DIAGNOSIS — Z794 Long term (current) use of insulin: Secondary | ICD-10-CM | POA: Diagnosis not present

## 2016-11-07 DIAGNOSIS — E1165 Type 2 diabetes mellitus with hyperglycemia: Secondary | ICD-10-CM | POA: Diagnosis not present

## 2016-11-07 DIAGNOSIS — E782 Mixed hyperlipidemia: Secondary | ICD-10-CM | POA: Diagnosis not present

## 2016-11-07 DIAGNOSIS — Z79899 Other long term (current) drug therapy: Secondary | ICD-10-CM | POA: Diagnosis not present

## 2016-11-07 DIAGNOSIS — E039 Hypothyroidism, unspecified: Secondary | ICD-10-CM | POA: Diagnosis not present

## 2016-11-10 DIAGNOSIS — H43813 Vitreous degeneration, bilateral: Secondary | ICD-10-CM | POA: Diagnosis not present

## 2016-11-10 DIAGNOSIS — E113313 Type 2 diabetes mellitus with moderate nonproliferative diabetic retinopathy with macular edema, bilateral: Secondary | ICD-10-CM | POA: Diagnosis not present

## 2016-11-10 DIAGNOSIS — H31091 Other chorioretinal scars, right eye: Secondary | ICD-10-CM | POA: Diagnosis not present

## 2016-11-14 DIAGNOSIS — I10 Essential (primary) hypertension: Secondary | ICD-10-CM | POA: Diagnosis not present

## 2016-11-14 DIAGNOSIS — E1142 Type 2 diabetes mellitus with diabetic polyneuropathy: Secondary | ICD-10-CM | POA: Diagnosis not present

## 2016-11-14 DIAGNOSIS — E782 Mixed hyperlipidemia: Secondary | ICD-10-CM | POA: Diagnosis not present

## 2016-11-14 DIAGNOSIS — E1165 Type 2 diabetes mellitus with hyperglycemia: Secondary | ICD-10-CM | POA: Diagnosis not present

## 2016-12-12 DIAGNOSIS — B351 Tinea unguium: Secondary | ICD-10-CM | POA: Diagnosis not present

## 2016-12-12 DIAGNOSIS — E1142 Type 2 diabetes mellitus with diabetic polyneuropathy: Secondary | ICD-10-CM | POA: Diagnosis not present

## 2017-01-04 DIAGNOSIS — G4733 Obstructive sleep apnea (adult) (pediatric): Secondary | ICD-10-CM | POA: Diagnosis not present

## 2017-01-19 DIAGNOSIS — E113392 Type 2 diabetes mellitus with moderate nonproliferative diabetic retinopathy without macular edema, left eye: Secondary | ICD-10-CM | POA: Diagnosis not present

## 2017-01-19 DIAGNOSIS — E113311 Type 2 diabetes mellitus with moderate nonproliferative diabetic retinopathy with macular edema, right eye: Secondary | ICD-10-CM | POA: Diagnosis not present

## 2017-01-19 DIAGNOSIS — H3582 Retinal ischemia: Secondary | ICD-10-CM | POA: Diagnosis not present

## 2017-01-19 DIAGNOSIS — H43813 Vitreous degeneration, bilateral: Secondary | ICD-10-CM | POA: Diagnosis not present

## 2017-01-22 DIAGNOSIS — Z23 Encounter for immunization: Secondary | ICD-10-CM | POA: Diagnosis not present

## 2017-01-22 DIAGNOSIS — Z0000 Encounter for general adult medical examination without abnormal findings: Secondary | ICD-10-CM | POA: Diagnosis not present

## 2017-02-12 DIAGNOSIS — E782 Mixed hyperlipidemia: Secondary | ICD-10-CM | POA: Diagnosis not present

## 2017-02-12 DIAGNOSIS — E1165 Type 2 diabetes mellitus with hyperglycemia: Secondary | ICD-10-CM | POA: Diagnosis not present

## 2017-02-12 DIAGNOSIS — Z794 Long term (current) use of insulin: Secondary | ICD-10-CM | POA: Diagnosis not present

## 2017-02-12 DIAGNOSIS — E039 Hypothyroidism, unspecified: Secondary | ICD-10-CM | POA: Diagnosis not present

## 2017-03-01 DIAGNOSIS — B351 Tinea unguium: Secondary | ICD-10-CM | POA: Diagnosis not present

## 2017-03-01 DIAGNOSIS — E1142 Type 2 diabetes mellitus with diabetic polyneuropathy: Secondary | ICD-10-CM | POA: Diagnosis not present

## 2017-03-22 DIAGNOSIS — I10 Essential (primary) hypertension: Secondary | ICD-10-CM | POA: Diagnosis not present

## 2017-03-22 DIAGNOSIS — E782 Mixed hyperlipidemia: Secondary | ICD-10-CM | POA: Diagnosis not present

## 2017-03-22 DIAGNOSIS — E1142 Type 2 diabetes mellitus with diabetic polyneuropathy: Secondary | ICD-10-CM | POA: Diagnosis not present

## 2017-03-22 DIAGNOSIS — E1165 Type 2 diabetes mellitus with hyperglycemia: Secondary | ICD-10-CM | POA: Diagnosis not present

## 2017-03-30 DIAGNOSIS — H43813 Vitreous degeneration, bilateral: Secondary | ICD-10-CM | POA: Diagnosis not present

## 2017-03-30 DIAGNOSIS — E113313 Type 2 diabetes mellitus with moderate nonproliferative diabetic retinopathy with macular edema, bilateral: Secondary | ICD-10-CM | POA: Diagnosis not present

## 2017-03-30 DIAGNOSIS — H31091 Other chorioretinal scars, right eye: Secondary | ICD-10-CM | POA: Diagnosis not present

## 2017-05-08 DIAGNOSIS — E1142 Type 2 diabetes mellitus with diabetic polyneuropathy: Secondary | ICD-10-CM | POA: Diagnosis not present

## 2017-05-08 DIAGNOSIS — B351 Tinea unguium: Secondary | ICD-10-CM | POA: Diagnosis not present

## 2017-05-30 DIAGNOSIS — H31091 Other chorioretinal scars, right eye: Secondary | ICD-10-CM | POA: Diagnosis not present

## 2017-05-30 DIAGNOSIS — E113313 Type 2 diabetes mellitus with moderate nonproliferative diabetic retinopathy with macular edema, bilateral: Secondary | ICD-10-CM | POA: Diagnosis not present

## 2017-05-30 DIAGNOSIS — H3582 Retinal ischemia: Secondary | ICD-10-CM | POA: Diagnosis not present

## 2017-05-30 DIAGNOSIS — H43813 Vitreous degeneration, bilateral: Secondary | ICD-10-CM | POA: Diagnosis not present

## 2017-07-04 DIAGNOSIS — E782 Mixed hyperlipidemia: Secondary | ICD-10-CM | POA: Diagnosis not present

## 2017-07-04 DIAGNOSIS — E1165 Type 2 diabetes mellitus with hyperglycemia: Secondary | ICD-10-CM | POA: Diagnosis not present

## 2017-07-04 DIAGNOSIS — Z794 Long term (current) use of insulin: Secondary | ICD-10-CM | POA: Diagnosis not present

## 2017-07-04 DIAGNOSIS — E039 Hypothyroidism, unspecified: Secondary | ICD-10-CM | POA: Diagnosis not present

## 2017-07-06 DIAGNOSIS — E113399 Type 2 diabetes mellitus with moderate nonproliferative diabetic retinopathy without macular edema, unspecified eye: Secondary | ICD-10-CM | POA: Diagnosis not present

## 2017-07-06 DIAGNOSIS — E1165 Type 2 diabetes mellitus with hyperglycemia: Secondary | ICD-10-CM | POA: Diagnosis not present

## 2017-07-06 DIAGNOSIS — E1142 Type 2 diabetes mellitus with diabetic polyneuropathy: Secondary | ICD-10-CM | POA: Diagnosis not present

## 2017-07-06 DIAGNOSIS — E782 Mixed hyperlipidemia: Secondary | ICD-10-CM | POA: Diagnosis not present

## 2017-07-17 DIAGNOSIS — E1142 Type 2 diabetes mellitus with diabetic polyneuropathy: Secondary | ICD-10-CM | POA: Diagnosis not present

## 2017-07-17 DIAGNOSIS — E1165 Type 2 diabetes mellitus with hyperglycemia: Secondary | ICD-10-CM | POA: Diagnosis not present

## 2017-07-17 DIAGNOSIS — I10 Essential (primary) hypertension: Secondary | ICD-10-CM | POA: Diagnosis not present

## 2017-07-17 DIAGNOSIS — E785 Hyperlipidemia, unspecified: Secondary | ICD-10-CM | POA: Diagnosis not present

## 2017-07-17 DIAGNOSIS — B351 Tinea unguium: Secondary | ICD-10-CM | POA: Diagnosis not present

## 2017-08-03 DIAGNOSIS — G4733 Obstructive sleep apnea (adult) (pediatric): Secondary | ICD-10-CM | POA: Diagnosis not present

## 2017-08-22 DIAGNOSIS — H31091 Other chorioretinal scars, right eye: Secondary | ICD-10-CM | POA: Diagnosis not present

## 2017-08-22 DIAGNOSIS — H3582 Retinal ischemia: Secondary | ICD-10-CM | POA: Diagnosis not present

## 2017-08-22 DIAGNOSIS — H43813 Vitreous degeneration, bilateral: Secondary | ICD-10-CM | POA: Diagnosis not present

## 2017-08-22 DIAGNOSIS — E113313 Type 2 diabetes mellitus with moderate nonproliferative diabetic retinopathy with macular edema, bilateral: Secondary | ICD-10-CM | POA: Diagnosis not present

## 2017-09-25 DIAGNOSIS — B351 Tinea unguium: Secondary | ICD-10-CM | POA: Diagnosis not present

## 2017-09-25 DIAGNOSIS — E1142 Type 2 diabetes mellitus with diabetic polyneuropathy: Secondary | ICD-10-CM | POA: Diagnosis not present

## 2017-10-03 DIAGNOSIS — E782 Mixed hyperlipidemia: Secondary | ICD-10-CM | POA: Diagnosis not present

## 2017-10-03 DIAGNOSIS — Z794 Long term (current) use of insulin: Secondary | ICD-10-CM | POA: Diagnosis not present

## 2017-10-03 DIAGNOSIS — E039 Hypothyroidism, unspecified: Secondary | ICD-10-CM | POA: Diagnosis not present

## 2017-10-03 DIAGNOSIS — E1165 Type 2 diabetes mellitus with hyperglycemia: Secondary | ICD-10-CM | POA: Diagnosis not present

## 2017-10-11 DIAGNOSIS — Z794 Long term (current) use of insulin: Secondary | ICD-10-CM | POA: Diagnosis not present

## 2017-10-11 DIAGNOSIS — F329 Major depressive disorder, single episode, unspecified: Secondary | ICD-10-CM | POA: Diagnosis not present

## 2017-10-11 DIAGNOSIS — E039 Hypothyroidism, unspecified: Secondary | ICD-10-CM | POA: Diagnosis not present

## 2017-10-11 DIAGNOSIS — I10 Essential (primary) hypertension: Secondary | ICD-10-CM | POA: Diagnosis not present

## 2017-10-11 DIAGNOSIS — E1142 Type 2 diabetes mellitus with diabetic polyneuropathy: Secondary | ICD-10-CM | POA: Diagnosis not present

## 2017-10-11 DIAGNOSIS — R5383 Other fatigue: Secondary | ICD-10-CM | POA: Diagnosis not present

## 2017-10-11 DIAGNOSIS — E669 Obesity, unspecified: Secondary | ICD-10-CM | POA: Diagnosis not present

## 2017-10-11 DIAGNOSIS — Z6841 Body Mass Index (BMI) 40.0 and over, adult: Secondary | ICD-10-CM | POA: Diagnosis not present

## 2017-10-11 DIAGNOSIS — E782 Mixed hyperlipidemia: Secondary | ICD-10-CM | POA: Diagnosis not present

## 2017-10-11 DIAGNOSIS — Z79899 Other long term (current) drug therapy: Secondary | ICD-10-CM | POA: Diagnosis not present

## 2017-10-11 DIAGNOSIS — R609 Edema, unspecified: Secondary | ICD-10-CM | POA: Diagnosis not present

## 2017-10-11 DIAGNOSIS — E1165 Type 2 diabetes mellitus with hyperglycemia: Secondary | ICD-10-CM | POA: Diagnosis not present

## 2017-11-15 DIAGNOSIS — E1165 Type 2 diabetes mellitus with hyperglycemia: Secondary | ICD-10-CM | POA: Diagnosis not present

## 2017-11-15 DIAGNOSIS — G4733 Obstructive sleep apnea (adult) (pediatric): Secondary | ICD-10-CM | POA: Diagnosis not present

## 2017-11-15 DIAGNOSIS — M1711 Unilateral primary osteoarthritis, right knee: Secondary | ICD-10-CM | POA: Diagnosis not present

## 2017-11-28 DIAGNOSIS — H3582 Retinal ischemia: Secondary | ICD-10-CM | POA: Diagnosis not present

## 2017-11-28 DIAGNOSIS — H31091 Other chorioretinal scars, right eye: Secondary | ICD-10-CM | POA: Diagnosis not present

## 2017-11-28 DIAGNOSIS — E113313 Type 2 diabetes mellitus with moderate nonproliferative diabetic retinopathy with macular edema, bilateral: Secondary | ICD-10-CM | POA: Diagnosis not present

## 2017-11-28 DIAGNOSIS — H43813 Vitreous degeneration, bilateral: Secondary | ICD-10-CM | POA: Diagnosis not present

## 2017-12-04 DIAGNOSIS — B351 Tinea unguium: Secondary | ICD-10-CM | POA: Diagnosis not present

## 2017-12-04 DIAGNOSIS — E1142 Type 2 diabetes mellitus with diabetic polyneuropathy: Secondary | ICD-10-CM | POA: Diagnosis not present

## 2018-01-02 DIAGNOSIS — E782 Mixed hyperlipidemia: Secondary | ICD-10-CM | POA: Diagnosis not present

## 2018-01-02 DIAGNOSIS — Z794 Long term (current) use of insulin: Secondary | ICD-10-CM | POA: Diagnosis not present

## 2018-01-02 DIAGNOSIS — E1165 Type 2 diabetes mellitus with hyperglycemia: Secondary | ICD-10-CM | POA: Diagnosis not present

## 2018-01-08 DIAGNOSIS — E039 Hypothyroidism, unspecified: Secondary | ICD-10-CM | POA: Diagnosis not present

## 2018-01-08 DIAGNOSIS — I10 Essential (primary) hypertension: Secondary | ICD-10-CM | POA: Diagnosis not present

## 2018-01-08 DIAGNOSIS — E1142 Type 2 diabetes mellitus with diabetic polyneuropathy: Secondary | ICD-10-CM | POA: Diagnosis not present

## 2018-01-08 DIAGNOSIS — Z79899 Other long term (current) drug therapy: Secondary | ICD-10-CM | POA: Diagnosis not present

## 2018-01-08 DIAGNOSIS — E782 Mixed hyperlipidemia: Secondary | ICD-10-CM | POA: Diagnosis not present

## 2018-01-08 DIAGNOSIS — E1165 Type 2 diabetes mellitus with hyperglycemia: Secondary | ICD-10-CM | POA: Diagnosis not present

## 2018-01-08 DIAGNOSIS — Z794 Long term (current) use of insulin: Secondary | ICD-10-CM | POA: Diagnosis not present

## 2018-01-16 DIAGNOSIS — Z23 Encounter for immunization: Secondary | ICD-10-CM | POA: Diagnosis not present

## 2018-02-06 DIAGNOSIS — G4733 Obstructive sleep apnea (adult) (pediatric): Secondary | ICD-10-CM | POA: Diagnosis not present

## 2018-02-12 DIAGNOSIS — E1142 Type 2 diabetes mellitus with diabetic polyneuropathy: Secondary | ICD-10-CM | POA: Diagnosis not present

## 2018-02-12 DIAGNOSIS — B351 Tinea unguium: Secondary | ICD-10-CM | POA: Diagnosis not present

## 2018-03-07 DIAGNOSIS — E1165 Type 2 diabetes mellitus with hyperglycemia: Secondary | ICD-10-CM | POA: Diagnosis not present

## 2018-03-07 DIAGNOSIS — M1711 Unilateral primary osteoarthritis, right knee: Secondary | ICD-10-CM | POA: Diagnosis not present

## 2018-03-07 DIAGNOSIS — I10 Essential (primary) hypertension: Secondary | ICD-10-CM | POA: Diagnosis not present

## 2018-03-27 DIAGNOSIS — E113392 Type 2 diabetes mellitus with moderate nonproliferative diabetic retinopathy without macular edema, left eye: Secondary | ICD-10-CM | POA: Diagnosis not present

## 2018-03-27 DIAGNOSIS — E113311 Type 2 diabetes mellitus with moderate nonproliferative diabetic retinopathy with macular edema, right eye: Secondary | ICD-10-CM | POA: Diagnosis not present

## 2018-03-27 DIAGNOSIS — H31091 Other chorioretinal scars, right eye: Secondary | ICD-10-CM | POA: Diagnosis not present

## 2018-03-27 DIAGNOSIS — H3582 Retinal ischemia: Secondary | ICD-10-CM | POA: Diagnosis not present

## 2018-04-03 DIAGNOSIS — E1165 Type 2 diabetes mellitus with hyperglycemia: Secondary | ICD-10-CM | POA: Diagnosis not present

## 2018-04-03 DIAGNOSIS — Z794 Long term (current) use of insulin: Secondary | ICD-10-CM | POA: Diagnosis not present

## 2018-04-03 DIAGNOSIS — E782 Mixed hyperlipidemia: Secondary | ICD-10-CM | POA: Diagnosis not present

## 2018-04-11 DIAGNOSIS — I10 Essential (primary) hypertension: Secondary | ICD-10-CM | POA: Diagnosis not present

## 2018-04-11 DIAGNOSIS — E039 Hypothyroidism, unspecified: Secondary | ICD-10-CM | POA: Diagnosis not present

## 2018-04-11 DIAGNOSIS — Z79899 Other long term (current) drug therapy: Secondary | ICD-10-CM | POA: Diagnosis not present

## 2018-04-11 DIAGNOSIS — E782 Mixed hyperlipidemia: Secondary | ICD-10-CM | POA: Diagnosis not present

## 2018-04-11 DIAGNOSIS — E1165 Type 2 diabetes mellitus with hyperglycemia: Secondary | ICD-10-CM | POA: Diagnosis not present

## 2018-04-11 DIAGNOSIS — Z794 Long term (current) use of insulin: Secondary | ICD-10-CM | POA: Diagnosis not present

## 2018-04-11 DIAGNOSIS — E1142 Type 2 diabetes mellitus with diabetic polyneuropathy: Secondary | ICD-10-CM | POA: Diagnosis not present

## 2018-04-23 DIAGNOSIS — E1142 Type 2 diabetes mellitus with diabetic polyneuropathy: Secondary | ICD-10-CM | POA: Diagnosis not present

## 2018-04-23 DIAGNOSIS — B351 Tinea unguium: Secondary | ICD-10-CM | POA: Diagnosis not present

## 2018-07-08 DIAGNOSIS — E1165 Type 2 diabetes mellitus with hyperglycemia: Secondary | ICD-10-CM | POA: Diagnosis not present

## 2018-07-08 DIAGNOSIS — G2581 Restless legs syndrome: Secondary | ICD-10-CM | POA: Diagnosis not present

## 2018-07-08 DIAGNOSIS — I10 Essential (primary) hypertension: Secondary | ICD-10-CM | POA: Diagnosis not present

## 2018-07-17 DIAGNOSIS — E039 Hypothyroidism, unspecified: Secondary | ICD-10-CM | POA: Diagnosis not present

## 2018-07-17 DIAGNOSIS — E1165 Type 2 diabetes mellitus with hyperglycemia: Secondary | ICD-10-CM | POA: Diagnosis not present

## 2018-07-17 DIAGNOSIS — E782 Mixed hyperlipidemia: Secondary | ICD-10-CM | POA: Diagnosis not present

## 2018-07-17 DIAGNOSIS — Z794 Long term (current) use of insulin: Secondary | ICD-10-CM | POA: Diagnosis not present

## 2018-07-18 DIAGNOSIS — E1165 Type 2 diabetes mellitus with hyperglycemia: Secondary | ICD-10-CM | POA: Diagnosis not present

## 2018-07-18 DIAGNOSIS — I10 Essential (primary) hypertension: Secondary | ICD-10-CM | POA: Diagnosis not present

## 2018-07-18 DIAGNOSIS — Z79899 Other long term (current) drug therapy: Secondary | ICD-10-CM | POA: Diagnosis not present

## 2018-07-18 DIAGNOSIS — Z794 Long term (current) use of insulin: Secondary | ICD-10-CM | POA: Diagnosis not present

## 2018-07-18 DIAGNOSIS — E039 Hypothyroidism, unspecified: Secondary | ICD-10-CM | POA: Diagnosis not present

## 2018-07-18 DIAGNOSIS — E538 Deficiency of other specified B group vitamins: Secondary | ICD-10-CM | POA: Diagnosis not present

## 2018-07-18 DIAGNOSIS — E1142 Type 2 diabetes mellitus with diabetic polyneuropathy: Secondary | ICD-10-CM | POA: Diagnosis not present

## 2018-07-18 DIAGNOSIS — E782 Mixed hyperlipidemia: Secondary | ICD-10-CM | POA: Diagnosis not present

## 2018-07-24 DIAGNOSIS — E113313 Type 2 diabetes mellitus with moderate nonproliferative diabetic retinopathy with macular edema, bilateral: Secondary | ICD-10-CM | POA: Diagnosis not present

## 2018-10-09 DIAGNOSIS — E1165 Type 2 diabetes mellitus with hyperglycemia: Secondary | ICD-10-CM | POA: Diagnosis not present

## 2018-10-09 DIAGNOSIS — Z794 Long term (current) use of insulin: Secondary | ICD-10-CM | POA: Diagnosis not present

## 2018-10-09 DIAGNOSIS — E782 Mixed hyperlipidemia: Secondary | ICD-10-CM | POA: Diagnosis not present

## 2018-10-09 DIAGNOSIS — E538 Deficiency of other specified B group vitamins: Secondary | ICD-10-CM | POA: Diagnosis not present

## 2018-10-17 DIAGNOSIS — E782 Mixed hyperlipidemia: Secondary | ICD-10-CM | POA: Diagnosis not present

## 2018-10-17 DIAGNOSIS — Z79899 Other long term (current) drug therapy: Secondary | ICD-10-CM | POA: Diagnosis not present

## 2018-10-17 DIAGNOSIS — E039 Hypothyroidism, unspecified: Secondary | ICD-10-CM | POA: Diagnosis not present

## 2018-10-17 DIAGNOSIS — E11319 Type 2 diabetes mellitus with unspecified diabetic retinopathy without macular edema: Secondary | ICD-10-CM | POA: Diagnosis not present

## 2018-10-17 DIAGNOSIS — E538 Deficiency of other specified B group vitamins: Secondary | ICD-10-CM | POA: Diagnosis not present

## 2018-10-17 DIAGNOSIS — E1165 Type 2 diabetes mellitus with hyperglycemia: Secondary | ICD-10-CM | POA: Diagnosis not present

## 2018-10-17 DIAGNOSIS — Z794 Long term (current) use of insulin: Secondary | ICD-10-CM | POA: Diagnosis not present

## 2018-10-17 DIAGNOSIS — E1142 Type 2 diabetes mellitus with diabetic polyneuropathy: Secondary | ICD-10-CM | POA: Diagnosis not present

## 2018-10-17 DIAGNOSIS — I10 Essential (primary) hypertension: Secondary | ICD-10-CM | POA: Diagnosis not present

## 2018-10-21 ENCOUNTER — Other Ambulatory Visit: Payer: Self-pay

## 2018-11-07 DIAGNOSIS — I10 Essential (primary) hypertension: Secondary | ICD-10-CM | POA: Diagnosis not present

## 2018-11-07 DIAGNOSIS — M7110 Other infective bursitis, unspecified site: Secondary | ICD-10-CM | POA: Diagnosis not present

## 2018-11-07 DIAGNOSIS — E1165 Type 2 diabetes mellitus with hyperglycemia: Secondary | ICD-10-CM | POA: Diagnosis not present

## 2018-11-08 DIAGNOSIS — G4733 Obstructive sleep apnea (adult) (pediatric): Secondary | ICD-10-CM | POA: Diagnosis not present

## 2018-11-15 DIAGNOSIS — B351 Tinea unguium: Secondary | ICD-10-CM | POA: Diagnosis not present

## 2018-11-15 DIAGNOSIS — S90859A Superficial foreign body, unspecified foot, initial encounter: Secondary | ICD-10-CM | POA: Diagnosis not present

## 2018-11-15 DIAGNOSIS — E1142 Type 2 diabetes mellitus with diabetic polyneuropathy: Secondary | ICD-10-CM | POA: Diagnosis not present

## 2018-12-11 DIAGNOSIS — M1711 Unilateral primary osteoarthritis, right knee: Secondary | ICD-10-CM | POA: Diagnosis not present

## 2018-12-27 DIAGNOSIS — E113313 Type 2 diabetes mellitus with moderate nonproliferative diabetic retinopathy with macular edema, bilateral: Secondary | ICD-10-CM | POA: Diagnosis not present

## 2018-12-27 DIAGNOSIS — H43813 Vitreous degeneration, bilateral: Secondary | ICD-10-CM | POA: Diagnosis not present

## 2018-12-27 DIAGNOSIS — H3582 Retinal ischemia: Secondary | ICD-10-CM | POA: Diagnosis not present

## 2018-12-27 DIAGNOSIS — H26491 Other secondary cataract, right eye: Secondary | ICD-10-CM | POA: Diagnosis not present

## 2018-12-30 ENCOUNTER — Other Ambulatory Visit: Payer: Self-pay

## 2018-12-30 ENCOUNTER — Emergency Department (HOSPITAL_COMMUNITY): Payer: PPO

## 2018-12-30 ENCOUNTER — Inpatient Hospital Stay (HOSPITAL_COMMUNITY): Payer: PPO

## 2018-12-30 ENCOUNTER — Inpatient Hospital Stay (HOSPITAL_COMMUNITY)
Admission: EM | Admit: 2018-12-30 | Discharge: 2019-01-03 | DRG: 988 | Disposition: A | Discharge status: Home Health | Payer: PPO | Attending: Internal Medicine | Admitting: Internal Medicine

## 2018-12-30 ENCOUNTER — Encounter (HOSPITAL_COMMUNITY): Payer: Self-pay

## 2018-12-30 DIAGNOSIS — Z7982 Long term (current) use of aspirin: Secondary | ICD-10-CM

## 2018-12-30 DIAGNOSIS — B9689 Other specified bacterial agents as the cause of diseases classified elsewhere: Secondary | ICD-10-CM | POA: Diagnosis not present

## 2018-12-30 DIAGNOSIS — Z9989 Dependence on other enabling machines and devices: Secondary | ICD-10-CM

## 2018-12-30 DIAGNOSIS — G4730 Sleep apnea, unspecified: Secondary | ICD-10-CM | POA: Diagnosis not present

## 2018-12-30 DIAGNOSIS — E1169 Type 2 diabetes mellitus with other specified complication: Secondary | ICD-10-CM | POA: Diagnosis not present

## 2018-12-30 DIAGNOSIS — W260XXA Contact with knife, initial encounter: Secondary | ICD-10-CM | POA: Diagnosis present

## 2018-12-30 DIAGNOSIS — F32A Depression, unspecified: Secondary | ICD-10-CM | POA: Diagnosis present

## 2018-12-30 DIAGNOSIS — S61211A Laceration without foreign body of left index finger without damage to nail, initial encounter: Secondary | ICD-10-CM | POA: Diagnosis present

## 2018-12-30 DIAGNOSIS — M868X8 Other osteomyelitis, other site: Secondary | ICD-10-CM | POA: Diagnosis not present

## 2018-12-30 DIAGNOSIS — M86141 Other acute osteomyelitis, right hand: Secondary | ICD-10-CM | POA: Diagnosis not present

## 2018-12-30 DIAGNOSIS — E039 Hypothyroidism, unspecified: Secondary | ICD-10-CM | POA: Diagnosis not present

## 2018-12-30 DIAGNOSIS — F329 Major depressive disorder, single episode, unspecified: Secondary | ICD-10-CM | POA: Diagnosis not present

## 2018-12-30 DIAGNOSIS — A4901 Methicillin susceptible Staphylococcus aureus infection, unspecified site: Secondary | ICD-10-CM

## 2018-12-30 DIAGNOSIS — Z20828 Contact with and (suspected) exposure to other viral communicable diseases: Secondary | ICD-10-CM | POA: Diagnosis not present

## 2018-12-30 DIAGNOSIS — E860 Dehydration: Secondary | ICD-10-CM | POA: Diagnosis not present

## 2018-12-30 DIAGNOSIS — B9561 Methicillin susceptible Staphylococcus aureus infection as the cause of diseases classified elsewhere: Secondary | ICD-10-CM | POA: Diagnosis present

## 2018-12-30 DIAGNOSIS — L03012 Cellulitis of left finger: Secondary | ICD-10-CM | POA: Diagnosis not present

## 2018-12-30 DIAGNOSIS — M86642 Other chronic osteomyelitis, left hand: Secondary | ICD-10-CM

## 2018-12-30 DIAGNOSIS — Z833 Family history of diabetes mellitus: Secondary | ICD-10-CM | POA: Diagnosis not present

## 2018-12-30 DIAGNOSIS — Z23 Encounter for immunization: Secondary | ICD-10-CM

## 2018-12-30 DIAGNOSIS — E872 Acidosis, unspecified: Secondary | ICD-10-CM | POA: Diagnosis present

## 2018-12-30 DIAGNOSIS — I10 Essential (primary) hypertension: Secondary | ICD-10-CM | POA: Diagnosis present

## 2018-12-30 DIAGNOSIS — M659 Synovitis and tenosynovitis, unspecified: Secondary | ICD-10-CM | POA: Diagnosis present

## 2018-12-30 DIAGNOSIS — Z9841 Cataract extraction status, right eye: Secondary | ICD-10-CM

## 2018-12-30 DIAGNOSIS — G2581 Restless legs syndrome: Secondary | ICD-10-CM | POA: Diagnosis not present

## 2018-12-30 DIAGNOSIS — E782 Mixed hyperlipidemia: Secondary | ICD-10-CM | POA: Diagnosis present

## 2018-12-30 DIAGNOSIS — M869 Osteomyelitis, unspecified: Secondary | ICD-10-CM | POA: Diagnosis not present

## 2018-12-30 DIAGNOSIS — Z79890 Hormone replacement therapy (postmenopausal): Secondary | ICD-10-CM

## 2018-12-30 DIAGNOSIS — Z9842 Cataract extraction status, left eye: Secondary | ICD-10-CM | POA: Diagnosis not present

## 2018-12-30 DIAGNOSIS — N179 Acute kidney failure, unspecified: Secondary | ICD-10-CM | POA: Diagnosis not present

## 2018-12-30 DIAGNOSIS — Z87891 Personal history of nicotine dependence: Secondary | ICD-10-CM

## 2018-12-30 DIAGNOSIS — Z79899 Other long term (current) drug therapy: Secondary | ICD-10-CM

## 2018-12-30 DIAGNOSIS — G4733 Obstructive sleep apnea (adult) (pediatric): Secondary | ICD-10-CM | POA: Diagnosis present

## 2018-12-30 DIAGNOSIS — E119 Type 2 diabetes mellitus without complications: Secondary | ICD-10-CM | POA: Diagnosis not present

## 2018-12-30 DIAGNOSIS — Z794 Long term (current) use of insulin: Secondary | ICD-10-CM

## 2018-12-30 DIAGNOSIS — M7989 Other specified soft tissue disorders: Secondary | ICD-10-CM | POA: Diagnosis present

## 2018-12-30 DIAGNOSIS — Z961 Presence of intraocular lens: Secondary | ICD-10-CM | POA: Diagnosis not present

## 2018-12-30 DIAGNOSIS — E785 Hyperlipidemia, unspecified: Secondary | ICD-10-CM | POA: Diagnosis present

## 2018-12-30 DIAGNOSIS — L089 Local infection of the skin and subcutaneous tissue, unspecified: Secondary | ICD-10-CM | POA: Diagnosis not present

## 2018-12-30 DIAGNOSIS — L0889 Other specified local infections of the skin and subcutaneous tissue: Secondary | ICD-10-CM | POA: Diagnosis not present

## 2018-12-30 DIAGNOSIS — Z03818 Encounter for observation for suspected exposure to other biological agents ruled out: Secondary | ICD-10-CM | POA: Diagnosis not present

## 2018-12-30 DIAGNOSIS — Z89022 Acquired absence of left finger(s): Secondary | ICD-10-CM | POA: Diagnosis not present

## 2018-12-30 LAB — URINALYSIS, ROUTINE W REFLEX MICROSCOPIC
Bacteria, UA: NONE SEEN
Bilirubin Urine: NEGATIVE
Glucose, UA: NEGATIVE mg/dL
Hgb urine dipstick: NEGATIVE
Ketones, ur: NEGATIVE mg/dL
Nitrite: NEGATIVE
Protein, ur: 30 mg/dL — AB
Specific Gravity, Urine: 1.023 (ref 1.005–1.030)
pH: 5 (ref 5.0–8.0)

## 2018-12-30 LAB — COMPREHENSIVE METABOLIC PANEL
ALT: 13 U/L (ref 0–44)
AST: 18 U/L (ref 15–41)
Albumin: 2.8 g/dL — ABNORMAL LOW (ref 3.5–5.0)
Alkaline Phosphatase: 76 U/L (ref 38–126)
Anion gap: 12 (ref 5–15)
BUN: 24 mg/dL — ABNORMAL HIGH (ref 8–23)
CO2: 25 mmol/L (ref 22–32)
Calcium: 8.5 mg/dL — ABNORMAL LOW (ref 8.9–10.3)
Chloride: 96 mmol/L — ABNORMAL LOW (ref 98–111)
Creatinine, Ser: 1.4 mg/dL — ABNORMAL HIGH (ref 0.61–1.24)
GFR calc Af Amer: 58 mL/min — ABNORMAL LOW (ref >60)
GFR calc non Af Amer: 50 mL/min — ABNORMAL LOW (ref >60)
Glucose, Bld: 236 mg/dL — ABNORMAL HIGH (ref 70–99)
Potassium: 4.2 mmol/L (ref 3.5–5.1)
Sodium: 133 mmol/L — ABNORMAL LOW (ref 135–145)
Total Bilirubin: 0.5 mg/dL (ref 0.3–1.2)
Total Protein: 7.4 g/dL (ref 6.5–8.1)

## 2018-12-30 LAB — LACTIC ACID, PLASMA
Lactic Acid, Venous: 2.5 mmol/L (ref 0.5–1.9)
Lactic Acid, Venous: 1.8 mmol/L (ref 0.5–1.9)
Lactic Acid, Venous: 1.1 mmol/L (ref 0.5–1.9)

## 2018-12-30 LAB — CBC WITH DIFFERENTIAL/PLATELET
Abs Immature Granulocytes: 0.04 10*3/uL (ref 0.00–0.07)
Basophils Absolute: 0 10*3/uL (ref 0.0–0.1)
Basophils Relative: 0 %
Eosinophils Absolute: 0.1 10*3/uL (ref 0.0–0.5)
Eosinophils Relative: 1 %
HCT: 37.7 % — ABNORMAL LOW (ref 39.0–52.0)
Hemoglobin: 11.8 g/dL — ABNORMAL LOW (ref 13.0–17.0)
Immature Granulocytes: 0 %
Lymphocytes Relative: 9 %
Lymphs Abs: 0.9 10*3/uL (ref 0.7–4.0)
MCH: 28.3 pg (ref 26.0–34.0)
MCHC: 31.3 g/dL (ref 30.0–36.0)
MCV: 90.4 fL (ref 80.0–100.0)
Monocytes Absolute: 0.9 10*3/uL (ref 0.1–1.0)
Monocytes Relative: 9 %
Neutro Abs: 8.6 10*3/uL — ABNORMAL HIGH (ref 1.7–7.7)
Neutrophils Relative %: 81 %
Platelets: 334 10*3/uL (ref 150–400)
RBC: 4.17 MIL/uL — ABNORMAL LOW (ref 4.22–5.81)
RDW: 13.4 % (ref 11.5–15.5)
WBC: 10.6 10*3/uL — ABNORMAL HIGH (ref 4.0–10.5)
nRBC: 0 % (ref 0.0–0.2)

## 2018-12-30 LAB — HEMOGLOBIN A1C
Hgb A1c MFr Bld: 6.8 % — ABNORMAL HIGH (ref 4.8–5.6)
Mean Plasma Glucose: 148.46 mg/dL

## 2018-12-30 LAB — C-REACTIVE PROTEIN: CRP: 7.5 mg/dL — ABNORMAL HIGH (ref <1.0)

## 2018-12-30 LAB — PROTIME-INR
INR: 1.1 (ref 0.8–1.2)
Prothrombin Time: 14.1 seconds (ref 11.4–15.2)

## 2018-12-30 LAB — PROCALCITONIN: Procalcitonin: <0.1 ng/mL

## 2018-12-30 LAB — CBG MONITORING, ED
Glucose-Capillary: 163 mg/dL — ABNORMAL HIGH (ref 70–99)
Glucose-Capillary: 141 mg/dL — ABNORMAL HIGH (ref 70–99)

## 2018-12-30 LAB — SARS CORONAVIRUS 2 BY RT PCR (HOSPITAL ORDER, PERFORMED IN ~~LOC~~ HOSPITAL LAB): SARS Coronavirus 2: NEGATIVE

## 2018-12-30 LAB — CREATININE, URINE, RANDOM: Creatinine, Urine: 282.3 mg/dL

## 2018-12-30 LAB — APTT: aPTT: 37 seconds — ABNORMAL HIGH (ref 24–36)

## 2018-12-30 LAB — SEDIMENTATION RATE: Sed Rate: 125 mm/hr — ABNORMAL HIGH (ref 0–16)

## 2018-12-30 MED ORDER — VANCOMYCIN HCL IN DEXTROSE 1-5 GM/200ML-% IV SOLN: 1000.0000 mg | Freq: Once | INTRAVENOUS | Status: DC

## 2018-12-30 MED ORDER — VITAMIN B-12 1000 MCG PO TABS
1000.0000 ug | ORAL_TABLET | Freq: Every day | ORAL | Status: DC
  Administered 2018-12-31 – 2019-01-03 (×4): 1000 ug via ORAL
  Filled 2018-12-30 (×4): qty 1

## 2018-12-30 MED ORDER — GADOBUTROL 1 MMOL/ML IV SOLN
10.0000 mL | Freq: Once | INTRAVENOUS | Status: AC | PRN
  Administered 2018-12-30: 10 mL via INTRAVENOUS

## 2018-12-30 MED ORDER — ACETAMINOPHEN 325 MG PO TABS
650.0000 mg | ORAL_TABLET | Freq: Four times a day (QID) | ORAL | Status: DC | PRN
  Administered 2019-01-01: 650 mg via ORAL
  Filled 2018-12-30: qty 2

## 2018-12-30 MED ORDER — LEVOTHYROXINE SODIUM 25 MCG PO TABS
137.0000 ug | ORAL_TABLET | Freq: Every day | ORAL | Status: DC
  Administered 2018-12-31 – 2019-01-03 (×3): 137 ug via ORAL
  Filled 2018-12-30 (×3): qty 1

## 2018-12-30 MED ORDER — SODIUM CHLORIDE 0.9 % IV BOLUS
500.0000 mL | Freq: Once | INTRAVENOUS | Status: AC
  Administered 2018-12-30: 500 mL via INTRAVENOUS

## 2018-12-30 MED ORDER — ONDANSETRON HCL 4 MG/2ML IJ SOLN
4.0000 mg | Freq: Four times a day (QID) | INTRAMUSCULAR | Status: DC | PRN
  Administered 2019-01-01: 09:00:00 4 mg via INTRAVENOUS

## 2018-12-30 MED ORDER — TETANUS-DIPHTH-ACELL PERTUSSIS 5-2.5-18.5 LF-MCG/0.5 IM SUSP
0.5000 mL | Freq: Once | INTRAMUSCULAR | Status: AC
  Administered 2018-12-30: 18:00:00 0.5 mL via INTRAMUSCULAR
  Filled 2018-12-30: qty 0.5

## 2018-12-30 MED ORDER — SODIUM CHLORIDE 0.9% FLUSH: 3.0000 mL | Freq: Once | INTRAVENOUS | Status: DC

## 2018-12-30 MED ORDER — VANCOMYCIN HCL 10 G IV SOLR
2500.0000 mg | Freq: Once | INTRAVENOUS | Status: AC
  Administered 2018-12-30: 2500 mg via INTRAVENOUS
  Filled 2018-12-30: qty 2500

## 2018-12-30 MED ORDER — PIPERACILLIN-TAZOBACTAM 3.375 G IVPB 30 MIN
3.3750 g | Freq: Once | INTRAVENOUS | Status: AC
  Administered 2018-12-30: 19:00:00 3.375 g via INTRAVENOUS
  Filled 2018-12-30: qty 50

## 2018-12-30 MED ORDER — INSULIN ASPART 100 UNIT/ML ~~LOC~~ SOLN
0.0000 [IU] | Freq: Three times a day (TID) | SUBCUTANEOUS | Status: DC
  Administered 2018-12-31 (×2): 2 [IU] via SUBCUTANEOUS
  Administered 2018-12-31 – 2019-01-01 (×2): 3 [IU] via SUBCUTANEOUS
  Administered 2019-01-01: 13:00:00 2 [IU] via SUBCUTANEOUS
  Administered 2019-01-02: 18:00:00 5 [IU] via SUBCUTANEOUS
  Administered 2019-01-02: 09:00:00 2 [IU] via SUBCUTANEOUS
  Administered 2019-01-02: 12:00:00 5 [IU] via SUBCUTANEOUS
  Administered 2019-01-03 (×2): 2 [IU] via SUBCUTANEOUS

## 2018-12-30 MED ORDER — ASPIRIN EC 81 MG PO TBEC
81.0000 mg | DELAYED_RELEASE_TABLET | Freq: Every day | ORAL | Status: DC
  Administered 2018-12-31 – 2019-01-03 (×4): 81 mg via ORAL
  Filled 2018-12-30 (×5): qty 1

## 2018-12-30 MED ORDER — SODIUM CHLORIDE 0.9 % IV SOLN: 2.0000 g | Freq: Once | INTRAVENOUS | Status: DC

## 2018-12-30 MED ORDER — INSULIN GLARGINE 100 UNIT/ML ~~LOC~~ SOLN
30.0000 [IU] | Freq: Every day | SUBCUTANEOUS | Status: DC
  Filled 2018-12-30: qty 0.3

## 2018-12-30 MED ORDER — ATORVASTATIN CALCIUM 10 MG PO TABS
20.0000 mg | ORAL_TABLET | Freq: Every day | ORAL | Status: DC
  Administered 2018-12-31 – 2019-01-03 (×4): 20 mg via ORAL
  Filled 2018-12-30 (×4): qty 2

## 2018-12-30 MED ORDER — ONDANSETRON HCL 4 MG PO TABS: 4.0000 mg | ORAL_TABLET | Freq: Four times a day (QID) | ORAL | Status: DC | PRN

## 2018-12-30 MED ORDER — INSULIN GLARGINE 100 UNIT/ML ~~LOC~~ SOLN
20.0000 [IU] | Freq: Every day | SUBCUTANEOUS | Status: DC
  Administered 2018-12-30 – 2019-01-01 (×3): 20 [IU] via SUBCUTANEOUS
  Filled 2018-12-30 (×3): qty 0.2

## 2018-12-30 MED ORDER — PRAMIPEXOLE DIHYDROCHLORIDE 1 MG PO TABS
1.0000 mg | ORAL_TABLET | Freq: Every day | ORAL | Status: DC
  Administered 2018-12-30 – 2019-01-02 (×4): 1 mg via ORAL
  Filled 2018-12-30 (×5): qty 1

## 2018-12-30 MED ORDER — NAPHAZOLINE-PHENIRAMINE 0.025-0.3 % OP SOLN: 1.0000 [drp] | OPHTHALMIC | Status: DC | PRN

## 2018-12-30 MED ORDER — VANCOMYCIN HCL 10 G IV SOLR
2000.0000 mg | INTRAVENOUS | Status: DC
  Administered 2018-12-31 – 2019-01-01 (×2): 2000 mg via INTRAVENOUS
  Filled 2018-12-30 (×4): qty 2000

## 2018-12-30 MED ORDER — HYDRALAZINE HCL 25 MG PO TABS: 25.0000 mg | ORAL_TABLET | Freq: Three times a day (TID) | ORAL | Status: DC | PRN

## 2018-12-30 MED ORDER — SENNOSIDES-DOCUSATE SODIUM 8.6-50 MG PO TABS: 1.0000 | ORAL_TABLET | Freq: Every evening | ORAL | Status: DC | PRN

## 2018-12-30 MED ORDER — PAROXETINE HCL 20 MG PO TABS
20.0000 mg | ORAL_TABLET | ORAL | Status: DC
  Administered 2018-12-31 – 2019-01-03 (×4): 20 mg via ORAL
  Filled 2018-12-30 (×5): qty 1

## 2018-12-30 MED ORDER — AMLODIPINE BESYLATE 10 MG PO TABS
10.0000 mg | ORAL_TABLET | Freq: Every day | ORAL | Status: DC
  Administered 2018-12-31 – 2019-01-03 (×4): 10 mg via ORAL
  Filled 2018-12-30 (×4): qty 1

## 2018-12-30 MED ORDER — ACETAMINOPHEN 650 MG RE SUPP: 650.0000 mg | Freq: Four times a day (QID) | RECTAL | Status: DC | PRN

## 2018-12-30 MED ORDER — SODIUM CHLORIDE 0.9 % IV SOLN
Freq: Once | INTRAVENOUS | Status: AC
  Administered 2019-01-01: 12:00:00 via INTRAVENOUS

## 2018-12-30 NOTE — Progress Notes (Addendum)
Asked by EDP to provide hand surgical consultative services to hospitalist service for patient with L UE chronic deep infection.    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  Now with draining purulence and prox edema, with xray suggestion of osteomyelitis, WBC just 10.  I recommended (and ordered)  MRI to evaluate extent of osteomyelitis in preparation for likely surgical intervention.  Patient obviously needs inpatient admission for parenteral antimicrobials and ID consult.  May need protracted parenteral antimicrobials, depending upon clinical goals, level of amputation, and ID recommendations.   I will review MRI scan and then provide further recommendations from surgical perspective.  Recommendation likely to be index finger amputation, likely on Wednesday after 24-48 hours of parenteral antibiotics.  Please avoid anti-coagulants and keep NPO until MRI scan completed and plan finalized.  Micheline Rough, MD [facility] [phone]

## 2018-12-30 NOTE — Progress Notes (Signed)
Pharmacy Antibiotic Note  YASHUA GANESAN is a 73 y.o. male admitted on 12/30/2018 with wound on L index finger with possible osteomyelitis. Scr 1.4. LA 2.5 > 1.8.  Vancomycin 2000 mg IV Q 24 hrs. Goal AUC 400-550. Expected AUC: 440  SCr used: 1.4   Plan: -vancomycin 2 g IV q24h -monitor levels at steady state -monitor renal fx and culture data  Temp (24hrs), Avg:98.5 F (36.9 C), Min:98.4 F (36.9 C), Max:98.5 F (36.9 C)  Recent Labs  Lab 12/30/18 1336 12/30/18 1430 12/30/18 1710  WBC 10.6*  --   --   CREATININE 1.40*  --   --   LATICACIDVEN  --  2.5* 1.8     Antimicrobials this admission: 10/12 vancomycin > 10/12 zosyn x1  Dose adjustments this admission: N/A  Microbiology results: 10/12 Covid-19: neg 10/12 blood cx: ip 10/12 wound cx: ip  Harvel Quale 12/30/2018 8:06 PM

## 2018-12-30 NOTE — Progress Notes (Signed)
Patient is not in his room at this time, per patient family member and RN note that patient is in MRI. Cant assess readiness for CPAP at this time.

## 2018-12-30 NOTE — ED Provider Notes (Signed)
[facility] EMERGENCY DEPARTMENT Provider Note   CSN: [id number] Arrival date & time: 12/30/18  1243     History   Chief Complaint No chief complaint on file.   HPI Ronald Heath is a 73 y.o. male with a past medical history of diabetes, hypertension, obesity, sleep apnea, hypothyroid, depression, who presents today for evaluation of finger swelling.  He is right-hand dominant.  He reports that about 8 months ago he accidentally cut off the tips of his left pointer and long finger with a butcher knife.  He did not see medical care at that time.  He states that over the past 8 months he has been treating it with peroxide and Neosporin.  He states that it never fully healed and worsened over the past month.  About 2 weeks ago he noticed that it had swollen to the point that it started draining purulent material.  He states that 1 week ago he was pressing on it to get purulent drainage out and saw what he thought was extra purulence in the tip of the finger so he used tweezers to pull however realized that that felt like bone and stopped.  He is unsure when his last tetanus shot was.  He denies any fevers.  He reports that it only hurts if he attempts to move it.  He reports that he feels otherwise well.     HPI  Past Medical History:  Diagnosis Date   Depression    Diabetes mellitus without complication ([place])    Hyperlipidemia    Hypertension    Hypothyroidism    Shortness of breath    with exertion   Sleep apnea    pt is supposed to wear CPAP, but doesn't wear it    Patient Active Problem List   Diagnosis Date Noted   Finger infection-Left index finger 12/30/2018   RLS (restless legs syndrome) 12/30/2018   Hypothyroidism    Hypertension    Hyperlipidemia    Diabetes mellitus without complication (H[facility])    Depression    AKI (acute kidney injury) ([place])    Lactic acidosis    OSA on CPAP     Past Surgical History:  Procedure  Laterality Date   CATARACT EXTRACTION W/PHACO Right 11/21/2012   Procedure: CATARACT EXTRACTION PHACO AND INTRAOCULAR LENS PLACEMENT ([place]);  Surgeon: Tonny Branch, MD;  Location: [facility];  Service: Ophthalmology;  Laterality: Right;  CDE:19.67   CATARACT EXTRACTION W/PHACO Left 12/19/2012   Procedure: CATARACT EXTRACTION PHACO AND INTRAOCULAR LENS PLACEMENT ([facility]);  Surgeon: Tonny Branch, MD;  Location: [facility];  Service: Ophthalmology;  Laterality: Left;  CDE:8.00   KNEE ARTHROSCOPY Left    ROTATOR CUFF REPAIR Right         Home Medications    Prior to Admission medications   Medication Sig Start Date End Date Taking? Authorizing Provider  amLODipine (NORVASC) 10 MG tablet Take 5 mg by mouth daily.    Yes [provider]  aspirin EC 81 MG tablet Take 81 mg by mouth daily.   Yes [provider]  atorvastatin (LIPITOR) 20 MG tablet Take 20 mg by mouth daily.   Yes [provider]  glipiZIDE (GLUCOTROL XL) 10 MG 24 hr tablet Take 10 mg by mouth daily.   Yes [provider]  hydrochlorothiazide (HYDRODIURIL) 25 MG tablet Take 25 mg by mouth daily.   Yes [provider]  insulin glargine (LANTUS) 100 UNIT/ML injection Inject 50 Units into the skin at  bedtime.   Yes [provider]  insulin lispro (HUMALOG) 100 UNIT/ML injection Inject 15 Units into the skin 3 (three) times daily after meals.    Yes [provider]  levothyroxine (SYNTHROID, LEVOTHROID) 137 MCG tablet Take 137 mcg by mouth daily before breakfast.   Yes [provider]  losartan (COZAAR) 100 MG tablet Take 100 mg by mouth daily.   Yes [provider]  metFORMIN (GLUCOPHAGE) 1000 MG tablet Take 1,000 mg by mouth 2 (two) times daily with a meal.   Yes [provider]  mometasone (NASONEX) 50 MCG/ACT nasal spray Place 2 sprays into the nose daily as needed (for seasonal allergies).    Yes [provider]  Naphazoline-Pheniramine (OPCON-A)  0.027-0.315 % SOLN Place 1-2 drops into both eyes as needed (for itching).   Yes [provider]  naproxen sodium (ALEVE) 220 MG tablet Take 440 mg by mouth every morning.   Yes [provider]  PARoxetine (PAXIL) 40 MG tablet Take 20 mg by mouth every morning.   Yes [provider]  pioglitazone (ACTOS) 30 MG tablet Take 15 mg by mouth daily.    Yes [provider]  pramipexole (MIRAPEX) 1 MG tablet Take 1 mg by mouth at bedtime. 11/06/18  Yes [provider]  Semaglutide,0.25 or 0.5MG /DOS, (OZEMPIC, 0.25 OR 0.5 MG/DOSE,) 2 MG/1.5ML SOPN Inject 0.5 mg into the skin every Wednesday.   Yes [provider]  tobramycin (TOBREX) 0.3 % ophthalmic solution See admin instructions. As directed after eye injections 12/27/18  Yes [provider]  vitamin B-12 (CYANOCOBALAMIN) 1000 MCG tablet Take 1,000 mcg by mouth daily.   Yes [provider]    Family History Family History  Problem Relation Age of Onset   Diabetes Mellitus II Father    Stroke Father     Social History Social History   Tobacco Use   Smoking status: Former Smoker    Packs/day: 2.00    Years: 15.00    Pack years: 30.00    Types: Cigarettes  Substance Use Topics   Alcohol use: Yes    Alcohol/week: 2.0 standard drinks    Types: 2 Cans of beer per week   Drug use: No     Allergies   Patient has no known allergies.   Review of Systems Review of Systems  Constitutional: Negative for chills and fever.  Skin: Positive for color change and wound.  All other systems reviewed and are negative.    Physical Exam Updated Vital Signs BP (!) 163/49    Pulse 67    Temp 98.5 F (36.9 C) (Oral)    Resp (!) 24    SpO2 96%   Physical Exam Vitals signs and nursing note reviewed.  Constitutional:      General: He is not in acute distress.    Appearance: He is well-developed. He is obese. He is not diaphoretic.  HENT:     Head: Normocephalic and  atraumatic.  Eyes:     General: No scleral icterus.       Right eye: No discharge.        Left eye: No discharge.     Conjunctiva/sclera: Conjunctivae normal.  Neck:     Musculoskeletal: Normal range of motion.  Cardiovascular:     Rate and Rhythm: Normal rate and regular rhythm.  Pulmonary:     Effort: Pulmonary effort is normal. No respiratory distress.     Breath sounds: Normal breath sounds. No stridor.  Abdominal:  General: There is no distension.     Tenderness: There is no abdominal tenderness.  Musculoskeletal:        General: No deformity.     Comments: Please see clinical pictures.  Obvious edema to the left index finger.  Range of motion of left index finger limited secondary to pain and swelling.  Skin:    General: Skin is warm and dry.     Comments: Please see clinical images.  The left index finger is significantly distended with obvious purulent drainage from the distal aspect and from the palmar surface.  The skin in this area is red and has peeled.  Neurological:     General: No focal deficit present.     Mental Status: He is alert.     Cranial Nerves: No cranial nerve deficit.     Motor: No abnormal muscle tone.  Psychiatric:        Mood and Affect: Mood normal.        Behavior: Behavior normal.                ED Treatments / Results  Labs (all labs ordered are listed, but only abnormal results are displayed) Labs Reviewed  LACTIC ACID, PLASMA - Abnormal; Notable for the following components:      Result Value   Lactic Acid, Venous 2.5 (*)    All other components within normal limits  COMPREHENSIVE METABOLIC PANEL - Abnormal; Notable for the following components:   Sodium 133 (*)    Chloride 96 (*)    Glucose, Bld 236 (*)    BUN 24 (*)    Creatinine, Ser 1.40 (*)    Calcium 8.5 (*)    Albumin 2.8 (*)    GFR calc non Af Amer 50 (*)    GFR calc Af Amer 58 (*)    All other components within normal limits  CBC WITH DIFFERENTIAL/PLATELET  - Abnormal; Notable for the following components:   WBC 10.6 (*)    RBC 4.17 (*)    Hemoglobin 11.8 (*)    HCT 37.7 (*)    Neutro Abs 8.6 (*)    All other components within normal limits  APTT - Abnormal; Notable for the following components:   aPTT 37 (*)    All other components within normal limits  CBG MONITORING, ED - Abnormal; Notable for the following components:   Glucose-Capillary 163 (*)    All other components within normal limits  SARS CORONAVIRUS 2 BY RT PCR (HOSPITAL ORDER, [facility])  AEROBIC/ANAEROBIC CULTURE (SURGICAL/DEEP WOUND)  CULTURE, BLOOD (ROUTINE X 2)  CULTURE, BLOOD (ROUTINE X 2)  LACTIC ACID, PLASMA  PROTIME-INR  URINALYSIS, ROUTINE W REFLEX MICROSCOPIC  SEDIMENTATION RATE  C-REACTIVE PROTEIN  LACTIC ACID, PLASMA  LACTIC ACID, PLASMA  PROCALCITONIN  UREA NITROGEN, URINE  CREATININE, URINE, RANDOM  HEMOGLOBIN A1C  HEMOGLOBIN [id number]  BASIC METABOLIC PANEL  CBC    EKG None  Radiology Dg Hand Complete Left  Result Date: 12/30/2018 CLINICAL DATA:  Left index finger pain for 2 weeks. Cut the tips of index finger. EXAM: LEFT HAND - COMPLETE 3+ VIEW COMPARISON:  None FINDINGS: Diffuse swelling about the hand but worse in the left index finger. Linear lucency and tuft resorption in the distal phalanx. Lucency at the base of the distal phalanx. Swelling involves the dorsum and volar surface of the hand proximally. IMPRESSION: Marked soft tissue swelling particularly about the index finger with signs of open fracture and/or  osteomyelitis of the distal phalanx. No radiopaque foreign body. Electronically Signed   By: Zetta Bills M.D.   On: 12/30/2018 14:27    Procedures Procedures (including critical care time)  Medications Ordered in ED Medications  sodium chloride flush (NS) 0.9 % injection 3 mL (3 mLs Intravenous Not Given 12/30/18 1627)  0.9 %  sodium chloride infusion ( Intravenous Hold 12/30/18 2206)  vancomycin  (VANCOCIN) 2,000 mg in sodium chloride 0.9 % 500 mL IVPB (has no administration in time range)  Tdap (BOOSTRIX) injection 0.5 mL (0.5 mLs Intramuscular Given 12/30/18 1739)  sodium chloride 0.9 % bolus 500 mL (0 mLs Intravenous Stopped 12/30/18 1920)  vancomycin (VANCOCIN) 2,500 mg in sodium chloride 0.9 % 500 mL IVPB (2,500 mg Intravenous New Bag/Given 12/30/18 2003)  piperacillin-tazobactam (ZOSYN) IVPB 3.375 g (0 g Intravenous Stopped 12/30/18 2006)  sodium chloride 0.9 % bolus 500 mL (500 mLs Intravenous New Bag/Given 12/30/18 2203)     Initial Impression / Assessment and Plan / ED Course  I have reviewed the triage vital signs and the nursing notes.  Pertinent labs & imaging results that were available during my care of the patient were reviewed by me and considered in my medical decision making (see chart for details).  Clinical Course as of Dec 29 2213  Mon Dec 30, 2018  [id number] Had hand surgery repaged.  After an hour no call back.  Will start patient on antibiotics given   [EH]  1828 I spoke with Dr. Grandville Silos who requested medicine be the primary team.  He recommended MRI of the hand, if medicine wishes a surgical consult they can re-contact.    [EH]  1916 Spoke with Dr. Blaine Hamper who will admit the patient.    [EH]    Clinical Course User Index [EH] Lorin Glass, PA-C      Patient presents today for evaluation of nonhealing wound on his left index finger.  This initially started when he cut the tip off approximately 8 months ago and did not seek medical care.  He reports that since then it has never healed fully.  He reports that over the past 2 weeks he had a significant amount of increased drainage and swelling and 1 week ago attempted to use tweezers to pull out what he thought was purulent material and realized that he was pulling on his bone.  He is unsure when his last tetanus shot was therefore Tdap updated.  Here he does not meet Sirs/sepsis criteria.  He is afebrile, not  tachycardic or tachypneic while in my care.  His white count is significantly elevated at 10.6 with a mild anemia at 11.8.  He has hyperglycemia with a sugar of 236, creatinine is elevated at 1.40.  X-ray was obtained showing diffuse swelling in the left index finger with concern for open fracture or osteomyelitis of the distal phalanx.  Physical exam shows a markedly swollen left index finger with purulent material draining from multiple sites.  Purulent material was sent for culture prior to antibiotics being started.  Initial lactic acid was elevated at 2.5.  He was given 500 cc fluid bolus.  He does not require 30/kg bolus as he is not septic, hypotensive and his lactic is under 4.  He was started on Zosyn and vancomycin for infection. I spoke with Dr. Grandville Silos from hand surgery who requested that medicine admit the patient, obtain MRI and reconsult him if needed for surgical interventions.  This patient was seen as a shared visit  with Dr. Wilson Singer. Coronavirus test was obtained which was negative. Blood cultures were obtained prior to the administration of antibiotics.  Patient remained hemodynamically stable while in my care.  I spoke with Dr. Blaine Hamper of hospitalist who agreed to admit patient.  Final Clinical Impressions(s) / ED Diagnoses   Final diagnoses:  Finger infection  Osteomyelitis of other site, unspecified type [facility])    ED Discharge Orders    None       Ollen Gross 12/30/18 2239    Virgel Manifold, MD 01/04/19 1114

## 2018-12-30 NOTE — ED Notes (Signed)
Patient transported to MRI 

## 2018-12-30 NOTE — ED Provider Notes (Signed)
Medical screening examination/treatment/procedure(s) were conducted as a shared visit with non-physician practitioner(s) and myself.  I personally evaluated the patient during the encounter.    73 year old male with infected left index finger. Initially cut tips of finger ~8 months ago. Small chronic wound with worsening swelling and drainage   Clinical and radiographic evidence of osteomyelitis.  Hx of diabetes. Antibiotics.  Hand surgery consultation.   Virgel Manifold, MD 01/04/19 1114

## 2018-12-30 NOTE — ED Triage Notes (Signed)
Patient here with swelling and drainage to left hand index finger that has worsened over the past 8 months. Patient with redness and peeling skin to same

## 2018-12-30 NOTE — Progress Notes (Signed)
MRI not to be started for another hour or so per discussion with MRI department.  May go ahead and provide diet for patient, with plan for NPO after MN Tuesday night for surgery early Wednesday morning.  Micheline Rough, MD [facility] [phone]

## 2018-12-30 NOTE — H&P (Addendum)
History and Physical    Ronald Heath WUJ:[id number] DOB: May 04, 1945 DOA: 12/30/2018  Referring MD/NP/PA:   PCP: Sinda Du, MD   Patient coming from:  The patient is coming from home.  At baseline, pt is independent for most of ADL.        Chief Complaint: left index finger infection  HPI: Ronald Heath is a 73 y.o. male with medical history significant of hypertension, hyperlipidemia, diabetes mellitus, hypothyroidism, depression, OSA on CPAP, RLS, who presents with left index finger infection.  Patient states that he injured her left fingertip 8 months ago. He reports that about 8 months ago he accidentally cut off the tips of his left pointer and long finger with a butcher knife.  He did not seek for medical care at that time.  He states that over the past 8 months he has been treating it with peroxide and Neosporin. The wound has never healed. It has been worsening progressively over the past month. About 2 weeks ago he noticed that it had swollen to the point that it started draining purulent material.  He states that 1 week ago he was pressing on it to get purulent drainage out and saw what he thought was extra purulence in the tip of the finger so he used tweezers to pull however realized that that felt like bone and stopped.  He has mild pain.  No fever or chills.  Patient denies chest pain, shortness breath, cough.  No nausea vomiting, diarrhea, abdominal pain, symptoms of UTI or unilateral weakness.  ED Course: pt was found to have WBC 10.6, lactic acid of 2.5, negative COVID-19 test, AKI with creatinine 1.40, BUN 24 (creatinine 1.07 on 10/09/2018), temperature normal, blood pressure 152/72, heart rate 65, oxygen saturation 96% on room air. Pending MRI-left hand. Pt is admitted to [facility] bed as inpatient.  Orthopedic surgeon, Dr. Grandville Silos was consulted.   X-ray of left hand showed: Marked soft tissue swelling particularly about the index finger with signs of open fracture  and/or osteomyelitis of the distal phalanx. No radiopaque foreign body.  Review of Systems:   General: no fevers, chills, no body weight gain, no fatigue HEENT: no blurry vision, hearing changes or sore throat Respiratory: no dyspnea, coughing, wheezing CV: no chest pain, no palpitations GI: no nausea, vomiting, abdominal pain, diarrhea, constipation GU: no dysuria, burning on urination, increased urinary frequency, hematuria  Ext: no leg edema Neuro: no unilateral weakness, numbness, or tingling, no vision change or hearing loss Skin:has skin peeling, pain, draining and swelling in left index finger. Please picture documented by EDP in her note. MSK: No muscle spasm, no deformity, no limitation of range of movement in spin Heme: No easy bruising.  Travel history: No recent long distant travel.  Allergy: No Known Allergies  Past Medical History:  Diagnosis Date   Depression    Diabetes mellitus without complication ([place])    Hyperlipidemia    Hypertension    Hypothyroidism    Shortness of breath    with exertion   Sleep apnea    pt is supposed to wear CPAP, but doesn't wear it    Past Surgical History:  Procedure Laterality Date   CATARACT EXTRACTION W/PHACO Right 11/21/2012   Procedure: CATARACT EXTRACTION PHACO AND INTRAOCULAR LENS PLACEMENT ([place]);  Surgeon: Tonny Branch, MD;  Location: [facility];  Service: Ophthalmology;  Laterality: Right;  CDE:19.67   CATARACT EXTRACTION W/PHACO Left 12/19/2012   Procedure: CATARACT EXTRACTION PHACO AND INTRAOCULAR LENS PLACEMENT ([facility]);  Surgeon: Tonny Branch, MD;  Location: [facility];  Service: Ophthalmology;  Laterality: Left;  CDE:8.00   KNEE ARTHROSCOPY Left    ROTATOR CUFF REPAIR Right     Social History:  reports that he has quit smoking. His smoking use included cigarettes. He has a 30.00 pack-year smoking history. He does not have any smokeless tobacco history on file. He reports current alcohol use of about 2.0 standard drinks of  alcohol per week. He reports that he does not use drugs.  Family History:  Family History  Problem Relation Age of Onset   Diabetes Mellitus II Father    Stroke Father      Prior to Admission medications   Medication Sig Start Date End Date Taking? Authorizing Provider  amLODipine (NORVASC) 10 MG tablet Take 10 mg by mouth daily.    [provider]  aspirin EC 81 MG tablet Take 81 mg by mouth daily.    [provider]  atorvastatin (LIPITOR) 20 MG tablet Take 20 mg by mouth daily.    [provider]  glipiZIDE (GLUCOTROL XL) 10 MG 24 hr tablet Take 10 mg by mouth daily.    [provider]  hydrochlorothiazide (HYDRODIURIL) 25 MG tablet Take 25 mg by mouth daily.    [provider]  insulin glargine (LANTUS) 100 UNIT/ML injection Inject 50 Units into the skin at bedtime.    [provider]  insulin lispro (HUMALOG) 100 UNIT/ML injection Inject 15 Units into the skin 3 (three) times daily before meals.    [provider]  levothyroxine (SYNTHROID, LEVOTHROID) 137 MCG tablet Take 137 mcg by mouth daily before breakfast.    [provider]  losartan (COZAAR) 100 MG tablet Take 100 mg by mouth daily.    [provider]  metFORMIN (GLUCOPHAGE) 1000 MG tablet Take 1,000 mg by mouth 2 (two) times daily with a meal.    [provider]  mometasone (NASONEX) 50 MCG/ACT nasal spray Place 2 sprays into the nose daily as needed (nasal congestion).    [provider]  PARoxetine (PAXIL) 40 MG tablet Take 20 mg by mouth daily.    [provider]  pioglitazone (ACTOS) 30 MG tablet Take 30 mg by mouth daily.    [provider]  pramipexole (MIRAPEX) 0.25 MG tablet Take 0.25 mg by mouth at bedtime.    [provider]    Physical Exam: Vitals:   12/30/18 2100 12/30/18 2200 12/30/18 2245 12/31/18 0032  BP: 128/63 (!) 163/49 135/68   Pulse: 65 67 71 72  Resp:  (!) 24 (!) 30     Temp:      TempSrc:      SpO2: 94% 96% 96% 98%   General: Not in acute distress HEENT:       Eyes: PERRL, EOMI, no scleral icterus.       ENT: No discharge from the ears and nose, no pharynx injection, no tonsillar enlargement.        Neck: No JVD, no bruit, no mass felt. Heme: No neck lymph node enlargement. Cardiac: S1/S2, RRR, No murmurs, No gallops or rubs. Respiratory:  No rales, wheezing, rhonchi or rubs. GI: Soft, nondistended, nontender, no rebound pain, no organomegaly, BS present. GU: No hematuria Ext: No pitting leg edema bilaterally. 2+DP/PT pulse bilaterally. Musculoskeletal: No joint deformities, No joint redness or warmth, no limitation of ROM in spin. Skin:  has redness, tenderness, skin peeling, purulent drainage from distal aspect of left index finger. Neuro:  Alert, oriented X3, cranial nerves II-XII grossly intact, moves all extremities normally.  Psych: Patient is not psychotic, no suicidal or hemocidal ideation.  Labs on Admission: I have personally reviewed following labs and imaging studies  CBC: Recent Labs  Lab 12/30/18 1336  WBC 10.6*  NEUTROABS 8.6*  HGB 11.8*  HCT 37.7*  MCV 90.4  PLT 035   Basic Metabolic Panel: Recent Labs  Lab 12/30/18 1336  NA 133*  K 4.2  CL 96*  CO2 25  GLUCOSE 236*  BUN 24*  CREATININE 1.40*  CALCIUM 8.5*   GFR: CrCl cannot be calculated (Unknown ideal weight.). Liver Function Tests: Recent Labs  Lab 12/30/18 1336  AST 18  ALT 13  ALKPHOS 76  BILITOT 0.5  PROT 7.4  ALBUMIN 2.8*   No results for input(s): LIPASE, AMYLASE in the last 168 hours. No results for input(s): AMMONIA in the last 168 hours. Coagulation Profile: Recent Labs  Lab 12/30/18 2051  INR 1.1   Cardiac Enzymes: No results for input(s): CKTOTAL, CKMB, CKMBINDEX, TROPONINI in the last 168 hours. BNP (last 3 results) No results for input(s): PROBNP in the last 8760 hours. HbA1C: Recent Labs    12/30/18 2051  HGBA1C 6.8*    CBG: Recent Labs  Lab 12/30/18 1914 12/30/18 2227  GLUCAP 163* 141*   Lipid Profile: No results for input(s): CHOL, HDL, LDLCALC, TRIG, CHOLHDL, LDLDIRECT in the last 72 hours. Thyroid Function Tests: No results for input(s): TSH, T4TOTAL, FREET4, T3FREE, THYROIDAB in the last 72 hours. Anemia Panel: No results for input(s): VITAMINB12, FOLATE, FERRITIN, TIBC, IRON, RETICCTPCT in the last 72 hours. Urine analysis:    Component Value Date/Time   COLORURINE YELLOW 12/30/2018 2120   APPEARANCEUR HAZY (A) 12/30/2018 2120   LABSPEC 1.023 12/30/2018 2120   PHURINE 5.0 12/30/2018 2120   GLUCOSEU NEGATIVE 12/30/2018 2120   HGBUR NEGATIVE 12/30/2018 2120   [place] NEGATIVE 12/30/2018 2120   [place] NEGATIVE 12/30/2018 2120   PROTEINUR 30 (A) 12/30/2018 2120   NITRITE NEGATIVE 12/30/2018 2120   LEUKOCYTESUR TRACE (A) 12/30/2018 2120   Sepsis Labs: _0 (procalcitonin:4,lacticidven:4) ) Recent Results (from the past 240 hour(s))  SARS Coronavirus 2 by RT PCR (hospital order, performed in [place] hospital lab) Nasopharyngeal Nasopharyngeal Swab     Status: None   Collection Time: 12/30/18  4:22 PM   Specimen: Nasopharyngeal Swab  Result Value Ref Range Status   SARS Coronavirus 2 NEGATIVE NEGATIVE Final    Comment: (NOTE) If result is NEGATIVE SARS-CoV-2 target nucleic acids are NOT DETECTED. The SARS-CoV-2 RNA is generally detectable in upper and lower  respiratory specimens during the acute phase of infection. The lowest  concentration of SARS-CoV-2 viral copies this assay can detect is 250  copies / mL. A negative result does not preclude SARS-CoV-2 infection  and should not be used as the sole basis for treatment or other  patient management decisions.  A negative result may occur with  improper specimen collection / handling, submission of specimen other  than nasopharyngeal swab, presence of viral mutation(s) within the  areas targeted by this assay, and  inadequate number of viral copies  (<250 copies / mL). A negative result must be combined with clinical  observations, patient history, and epidemiological information. If result is POSITIVE SARS-CoV-2 target nucleic acids are DETECTED. The SARS-CoV-2 RNA is generally detectable in upper and lower  respiratory specimens dur ing the acute phase of infection.  Positive  results are indicative of active infection with SARS-CoV-2.  Clinical  correlation with patient history and other diagnostic information is  necessary to determine patient infection status.  Positive results do  not rule out bacterial infection or co-infection with other viruses. If result is PRESUMPTIVE POSTIVE SARS-CoV-2 nucleic acids MAY BE PRESENT.   A presumptive positive result was obtained on the submitted specimen  and confirmed on repeat testing.  While 2019 novel coronavirus  (SARS-CoV-2) nucleic acids may be present in the submitted sample  additional confirmatory testing may be necessary for epidemiological  and / or clinical management purposes  to differentiate between  SARS-CoV-2 and other Sarbecovirus currently known to infect humans.  If clinically indicated additional testing with an alternate test  methodology [phone]) is advised. The SARS-CoV-2 RNA is generally  detectable in upper and lower respiratory sp ecimens during the acute  phase of infection. The expected result is Negative. Fact Sheet for Patients:  StrictlyIdeas.no Fact Sheet for Healthcare Providers: BankingDealers.co.za This test is not yet approved or cleared by the [place] FDA and has been authorized for detection and/or diagnosis of SARS-CoV-2 by FDA under an Emergency Use Authorization (EUA).  This EUA will remain in effect (meaning this test can be used) for the duration of the COVID-19 declaration under Section 564(b)(1) of the Act, 21 U.S.C. section 360bbb-3(b)(1), unless the  authorization is terminated or revoked sooner. Performed at [facility], [place] [address] [place], [place] [address]   Aerobic/Anaerobic Culture (surgical/deep wound)     Status: None (Preliminary result)   Collection Time: 12/30/18  5:25 PM   Specimen: Wound; Abscess  Result Value Ref Range Status   Specimen Description WOUND LEFT FINGER  Final   Special Requests NONE  Final   Gram Stain   Final    RARE WBC PRESENT, PREDOMINANTLY PMN RARE GRAM POSITIVE COCCI Performed at [facility], [place] [address], [place], [place] [address]    Culture PENDING  Incomplete   Report Status PENDING  Incomplete     Radiological Exams on Admission: Dg Hand Complete Left  Result Date: 12/30/2018 CLINICAL DATA:  Left index finger pain for 2 weeks. Cut the tips of index finger. EXAM: LEFT HAND - COMPLETE 3+ VIEW COMPARISON:  None FINDINGS: Diffuse swelling about the hand but worse in the left index finger. Linear lucency and tuft resorption in the distal phalanx. Lucency at the base of the distal phalanx. Swelling involves the dorsum and volar surface of the hand proximally. IMPRESSION: Marked soft tissue swelling particularly about the index finger with signs of open fracture and/or osteomyelitis of the distal phalanx. No radiopaque foreign body. Electronically Signed   By: Zetta Bills M.D.   On: 12/30/2018 14:27     EKG:  Not done in ED, will get one.   Assessment/Plan Principal Problem:   Finger infection-Left index finger Active Problems:   Hypothyroidism   Hypertension   Hyperlipidemia   Diabetes mellitus without complication (HCC)   Depression   AKI (acute kidney injury) (H[facility])   Lactic acidosis   OSA on CPAP   RLS (restless legs syndrome)  Finger infection-Left index finger: Patient has purulent discharge, no fever or chills.  WBC 10.6.  Lactic acid 2.5, but clinically does not meet criteria for sepsis.  Currently hemodynamically stable.  He has mild pain. Orthopedic  surgeon, Dr. Grandville Silos was consulted. He recommended to get MRI of left hand, "Recommendation likely to be index finger amputation, likely on Wednesday after 24-48 hours of parenteral antibiotics. Please avoid anti-coagulants and  keep NPO until MRI scan completed and plan finalized".   - will admit to med-surg as inpt - Highly appreciate Dr. Biagio Borg consultation and recommendations. - Empiric antimicrobial treatment with vancomycin (pt received 1 dose of Zosyn in ED) - PRN Zofran for nausea, tylenol for pain - Blood cultures x 2  - ESR and CRP - wound care consult - MRI-left hand  - will get Procalcitonin and trend lactic acid levels per sepsis protocol. - IVF: total of 1L of NS bolus, followed by 125 cc/h - INR/PTT - Please consult ID in AM.  Hypertension: -Hold HCTZ and Cozaar due to AKI -Increase amlodipine dose from 5 mg to 10 mg daily -As needed hydralazine orally  Hypothyroidism: -Synthroid  Hyperlipidemia: -Lipitor  Diabetes mellitus without complication ([place]): no I4P on record. Patient is taking Humalog, Actos, metformin, glipizide, Lantus, Ozempic injection at home.  Blood sugar 236 -->141 -will decrease Lantus dose from 50 to 20 unit daily -SSI -Check A1c  Depression: no SI or HI -Paxil  AKI (acute kidney injury) ([place]): Creatinine was 1.07 on 10/09/2018.  His creatinine is 1.40, BUN 24.  Likely due to dehydration and continuation of HCTZ and Cozaar. -Avoid using renal toxic medication, such as NSAIDs -IV fluid as above -Check FeUrea  Lactic acidosis: Lactic acid 2.5.  Patient does not have fever.  WBC is only 10.6.  Clinically not septic.  May be due to dehydration and use of metformin. -IV fluid as above -Trend lactic acid levels  OSA: - on CPAP   RLS (restless legs syndrome): -Pramipexole   Inpatient status:  # Patient requires inpatient status due to high intensity of service, high risk for further deterioration and high frequency of surveillance  required.  I certify that at the point of admission it is my clinical judgment that the patient will require inpatient hospital care spanning beyond 2 midnights from the point of admission.   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  Patient also has AKI and elevated lactic acid.  The worrisome physical exam findings: has skin peeling, pain, draining and swelling in left index finger.   The initial radiographic and laboratory data are worrisome because X-ray finding showed marked soft tissue swelling particularly about the index finger with signs of open fracture and/or osteomyelitis of the distal phalanx.  Patient also has mild leukocytosis and elevated lactic acid, AKI. Current medical needs: please see my assessment and plan Predictability of an adverse outcome (risk): Patient has multiple comorbidities, now presents with left index finger infection with purulent discharge.  Patient will need surgical intervention treatment, likely index finger amputation per orthopedic surgeon. Pt will need treated in hospital for at least 2 days.      DVT ppx: SCD Code Status: Full code Family Communication: None at bed side.     Disposition Plan:  Anticipate discharge back to previous home environment Consults called:  Ortho, Dr. Grandville Silos Admission status:   medical floor/inpt  Date of Service 12/31/2018    [facility]   If 7PM-7AM, please contact night-coverage [email or web address] Password [email or web address] 12/31/2018, 12:40 AM

## 2018-12-30 NOTE — Consult Note (Addendum)
ORTHOPAEDIC CONSULTATION HISTORY & PHYSICAL REQUESTING PHYSICIAN: Virgel Manifold, MD  Chief Complaint: L IF infection  HPI: Ronald Heath is a 73 y.o. male who presented to the ED today for concerns of his left index finger.  Patient states that he injured her left fingertip 8 months ago. He reports that about 8 months ago he accidentally cut off the tips of his left index and long fingers with a butcher knife. He did not seek for medical care at that time. He states that over the past 8 months he has been treating it with peroxide and Neosporin. The wound has never healed. It has been worsening progressively over the past month. About 2 weeks ago he noticed that it became more swollen and started draining purulent material. He states that 1 week ago he was pressing on it to get purulent drainage out and saw what he thought was extra purulence in the tip of the finger so he used tweezers to pull however realized that that felt like bone and stopped.  He has mild pain.  No fever or chills.  Patient denies chest pain, shortness breath, cough.  No nausea.  He is being admitted for inpatient infection care and hand surgery was consulted to provide surgical aspects of the care.  Past Medical History:  Diagnosis Date  . Depression   . Diabetes mellitus without complication ([place])   . Hyperlipidemia   . Hypertension   . Hypothyroidism   . Shortness of breath    with exertion  . Sleep apnea    pt is supposed to wear CPAP, but doesn't wear it   Past Surgical History:  Procedure Laterality Date  . CATARACT EXTRACTION W/PHACO Right 11/21/2012   Procedure: CATARACT EXTRACTION PHACO AND INTRAOCULAR LENS PLACEMENT (IOC);  Surgeon: Tonny Branch, MD;  Location: [facility];  Service: Ophthalmology;  Laterality: Right;  CDE:19.67  . CATARACT EXTRACTION W/PHACO Left 12/19/2012   Procedure: CATARACT EXTRACTION PHACO AND INTRAOCULAR LENS PLACEMENT (IOC);  Surgeon: Tonny Branch, MD;  Location: [facility];  Service:  Ophthalmology;  Laterality: Left;  CDE:8.00  . KNEE ARTHROSCOPY Left   . ROTATOR CUFF REPAIR Right    Social History   Socioeconomic History  . Marital status: Widowed    Spouse name: Not on file  . Number of children: Not on file  . Years of education: Not on file  . Highest education level: Not on file  Occupational History  . Not on file  Social Needs  . Financial resource strain: Not on file  . Food insecurity    Worry: Not on file    Inability: Not on file  . Transportation needs    Medical: Not on file    Non-medical: Not on file  Tobacco Use  . Smoking status: Former Smoker    Packs/day: 2.00    Years: 15.00    Pack years: 30.00    Types: Cigarettes  Substance and Sexual Activity  . Alcohol use: Yes    Alcohol/week: 2.0 standard drinks    Types: 2 Cans of beer per week  . Drug use: No  . Sexual activity: Not on file  Lifestyle  . Physical activity    Days per week: Not on file    Minutes per session: Not on file  . Stress: Not on file  Relationships  . Social Herbalist on phone: Not on file    Gets together: Not on file    Attends religious service: Not on  file    Active member of club or organization: Not on file    Attends meetings of clubs or organizations: Not on file    Relationship status: Not on file  Other Topics Concern  . Not on file  Social History Narrative  . Not on file   No family history on file. No Known Allergies Prior to Admission medications   Medication Sig Start Date End Date Taking? Authorizing Provider  amLODipine (NORVASC) 10 MG tablet Take 10 mg by mouth daily.    [provider]  aspirin EC 81 MG tablet Take 81 mg by mouth daily.    [provider]  atorvastatin (LIPITOR) 20 MG tablet Take 20 mg by mouth daily.    [provider]  glipiZIDE (GLUCOTROL XL) 10 MG 24 hr tablet Take 10 mg by mouth daily.    [provider]  hydrochlorothiazide (HYDRODIURIL) 25 MG tablet Take 25 mg  by mouth daily.    [provider]  insulin glargine (LANTUS) 100 UNIT/ML injection Inject 50 Units into the skin at bedtime.    [provider]  insulin lispro (HUMALOG) 100 UNIT/ML injection Inject 15 Units into the skin 3 (three) times daily before meals.    [provider]  levothyroxine (SYNTHROID, LEVOTHROID) 137 MCG tablet Take 137 mcg by mouth daily before breakfast.    [provider]  losartan (COZAAR) 100 MG tablet Take 100 mg by mouth daily.    [provider]  metFORMIN (GLUCOPHAGE) 1000 MG tablet Take 1,000 mg by mouth 2 (two) times daily with a meal.    [provider]  mometasone (NASONEX) 50 MCG/ACT nasal spray Place 2 sprays into the nose daily as needed (nasal congestion).    [provider]  PARoxetine (PAXIL) 40 MG tablet Take 20 mg by mouth daily.    [provider]  pioglitazone (ACTOS) 30 MG tablet Take 30 mg by mouth daily.    [provider]  pramipexole (MIRAPEX) 0.25 MG tablet Take 0.25 mg by mouth at bedtime.    [provider]   Dg Hand Complete Left  Result Date: 12/30/2018 CLINICAL DATA:  Left index finger pain for 2 weeks. Cut the tips of index finger. EXAM: LEFT HAND - COMPLETE 3+ VIEW COMPARISON:  None FINDINGS: Diffuse swelling about the hand but worse in the left index finger. Linear lucency and tuft resorption in the distal phalanx. Lucency at the base of the distal phalanx. Swelling involves the dorsum and volar surface of the hand proximally. IMPRESSION: Marked soft tissue swelling particularly about the index finger with signs of open fracture and/or osteomyelitis of the distal phalanx. No radiopaque foreign body. Electronically Signed   By: Zetta Bills M.D.   On: 12/30/2018 14:27    Positive ROS: All other systems have been reviewed and were otherwise negative with the exception of those mentioned in the HPI and as above.  Physical Exam: Vitals: Refer to EMR.  Constitutional:  WD, WN, NAD HEENT:  NCAT, EOMI Neuro/Psych:  Alert & oriented to person, place, and time; appropriate mood & affect Lymphatic: No generalized extremity edema or lymphadenopathy Extremities / MSK:  The extremities are normal with respect to appearance, ranges of motion, joint stability, muscle strength/tone, sensation, & perfusion except as otherwise noted:  Please refer to clinical photographs in the chart.  The left index finger is swollen, reddened, with a several millimeter area of dissolved dermis on the volar aspect, and a very more weathered distal  half of the digit, with dried crusting at the tip.  There is some expressible purulence from the open wound.  The digit is stiff, with poor active and passive motion.  The long finger tip is not normal, and has some changes that may be consistent with hypoperfusion itself.  It does not appear infected at this time.  The remainder of the hand is slightly swollen at the base of the index finger, extending to the base of the metacarpal.  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  In the setting, the digit will be difficult to salvage from the standpoint of viability, and almost certainly would be stiff and minimally functional  Recommendations: I discussed these findings with him.  MRI scan remains pending.  Unless MRI findings suggest differently, I have recommended to him left index finger amputation, and in the setting, I recommend through the MP joint rather than ray resection.  At some point in the future, should he be dissatisfied with the appearance or function of the MP level amputation, it could be revised to a ray resection electively when the acute issues with infection have resolved.  These goals, risk, and options were reviewed and consent obtained verbally.  Informed consent document to be  completed.  Surgery is presently scheduled for early in the morning on Wednesday, and he should be n.p.o. after midnight Tuesday night, without pharmacologic DVT prophylaxis.  Certainly amputation will be of great help in clearing this infection and impact infectious disease recommendations.  Rayvon Char [address], [place], [place]  [address] Office: [phone] Mobile: [phone]  12/30/2018, 6:48 PM

## 2018-12-30 NOTE — ED Notes (Signed)
Pt provided with sandwich and snacks (applesauce and crackers).

## 2018-12-31 ENCOUNTER — Other Ambulatory Visit: Payer: Self-pay

## 2018-12-31 ENCOUNTER — Encounter (HOSPITAL_COMMUNITY): Payer: Self-pay | Admitting: General Practice

## 2018-12-31 DIAGNOSIS — M869 Osteomyelitis, unspecified: Secondary | ICD-10-CM

## 2018-12-31 DIAGNOSIS — M86642 Other chronic osteomyelitis, left hand: Secondary | ICD-10-CM

## 2018-12-31 DIAGNOSIS — L089 Local infection of the skin and subcutaneous tissue, unspecified: Secondary | ICD-10-CM

## 2018-12-31 HISTORY — DX: Local infection of the skin and subcutaneous tissue, unspecified: L08.9

## 2018-12-31 LAB — BASIC METABOLIC PANEL
Anion gap: 13 (ref 5–15)
BUN: 23 mg/dL (ref 8–23)
CO2: 23 mmol/L (ref 22–32)
Calcium: 8.2 mg/dL — ABNORMAL LOW (ref 8.9–10.3)
Chloride: 99 mmol/L (ref 98–111)
Creatinine, Ser: 1.26 mg/dL — ABNORMAL HIGH (ref 0.61–1.24)
GFR calc Af Amer: >60 mL/min (ref >60)
GFR calc non Af Amer: 57 mL/min — ABNORMAL LOW (ref >60)
Glucose, Bld: 228 mg/dL — ABNORMAL HIGH (ref 70–99)
Potassium: 4 mmol/L (ref 3.5–5.1)
Sodium: 135 mmol/L (ref 135–145)

## 2018-12-31 LAB — GLUCOSE, CAPILLARY
Glucose-Capillary: 171 mg/dL — ABNORMAL HIGH (ref 70–99)
Glucose-Capillary: 181 mg/dL — ABNORMAL HIGH (ref 70–99)
Glucose-Capillary: 218 mg/dL — ABNORMAL HIGH (ref 70–99)
Glucose-Capillary: 245 mg/dL — ABNORMAL HIGH (ref 70–99)

## 2018-12-31 LAB — CBC
HCT: 32.4 % — ABNORMAL LOW (ref 39.0–52.0)
Hemoglobin: 10.5 g/dL — ABNORMAL LOW (ref 13.0–17.0)
MCH: 28.7 pg (ref 26.0–34.0)
MCHC: 32.4 g/dL (ref 30.0–36.0)
MCV: 88.5 fL (ref 80.0–100.0)
Platelets: 282 10*3/uL (ref 150–400)
RBC: 3.66 MIL/uL — ABNORMAL LOW (ref 4.22–5.81)
RDW: 13.4 % (ref 11.5–15.5)
WBC: 8.6 10*3/uL (ref 4.0–10.5)
nRBC: 0 % (ref 0.0–0.2)

## 2018-12-31 LAB — LACTIC ACID, PLASMA: Lactic Acid, Venous: 1 mmol/L (ref 0.5–1.9)

## 2018-12-31 LAB — HEMOGLOBIN A1C
Hgb A1c MFr Bld: 6.9 % — ABNORMAL HIGH (ref 4.8–5.6)
Mean Plasma Glucose: 151.33 mg/dL

## 2018-12-31 LAB — MRSA PCR SCREENING: MRSA by PCR: NEGATIVE

## 2018-12-31 NOTE — Progress Notes (Signed)
MRI reviewed. Recommend continuing parenteral antibiotics with plan for IF amputation through MP joint tomorrow AM. Already discussed this plan with patient and consent obtained.  Micheline Rough, MD Hand Surgery Mobile [phone]

## 2018-12-31 NOTE — Anesthesia Preprocedure Evaluation (Addendum)
Anesthesia Evaluation  Patient identified by MRN, date of birth, ID band Patient awake    Reviewed: Allergy & Precautions, H&P , NPO status , Patient's Chart, lab work & pertinent test results  Airway Mallampati: II  TM Distance: >3 FB Neck ROM: Full    Dental no notable dental hx. (+) Teeth Intact, Dental Advisory Given   Pulmonary sleep apnea and Continuous Positive Airway Pressure Ventilation , former smoker,    Pulmonary exam normal breath sounds clear to auscultation       Cardiovascular Exercise Tolerance: Good hypertension, Pt. on medications  Rhythm:Regular Rate:Normal     Neuro/Psych Depression negative neurological ROS     GI/Hepatic negative GI ROS, Neg liver ROS,   Endo/Other  diabetes, Insulin Dependent, Oral Hypoglycemic AgentsHypothyroidism   Renal/GU Renal InsufficiencyRenal disease  negative genitourinary   Musculoskeletal   Abdominal   Peds  Hematology negative hematology ROS (+)   Anesthesia Other Findings   Reproductive/Obstetrics negative OB ROS                            Anesthesia Physical Anesthesia Plan  ASA: III  Anesthesia Plan: General   Post-op Pain Management:    Induction: Intravenous  PONV Risk Score and Plan: 3 and Ondansetron and Treatment may vary due to age or medical condition  Airway Management Planned: LMA  Additional Equipment:   Intra-op Plan:   Post-operative Plan: Extubation in OR  Informed Consent: I have reviewed the patients History and Physical, chart, labs and discussed the procedure including the risks, benefits and alternatives for the proposed anesthesia with the patient or authorized representative who has indicated his/her understanding and acceptance.     Dental advisory given  Plan Discussed with: CRNA  Anesthesia Plan Comments:         Anesthesia Quick Evaluation

## 2018-12-31 NOTE — Consult Note (Signed)
[facility] Nurse wound consult note Patient receiving care in [facility] [id number].  Consult completed remotely after review of record including images. Reason for Consult: Left index finger Wound type: probable osteomyelitis resulting from finger tip being cut off by a butcher knife 8 months ago and not seeking medical help.  Description of how wound occurred and patient treatment of said wound well documented in H&P from yesterday by Dr. Blaine Hamper at 7:11 p.m.  Dressing procedure/placement/frequency:  Clean left index finger with soap and water. Pat dry. Wrap in Xeroform gauze Kellie Simmering [phone]) secure with kerlex. Change daily. Naturally, a topical dressing alone will not solve this problem.  I see a surgeon has been consulted.  Thank you for the consult. [facility] nurse will not follow at this time.  Please re-consult the [facility] team if needed.  Val Riles, RN, MSN, CWOCN, CNS-BC, pager [phone]

## 2018-12-31 NOTE — Progress Notes (Signed)
Pt. States he can place cpap on himself when ready. RT informed pt. To notify if he needed any assistance.

## 2018-12-31 NOTE — Progress Notes (Signed)
PROGRESS NOTE    Ronald Heath  BTD:[id number] DOB: 1946-02-02 DOA: 12/30/2018 PCP: Sinda Du, MD    Brief Narrative:  73 y.o. male with medical history significant of hypertension, hyperlipidemia, diabetes mellitus, hypothyroidism, depression, OSA on CPAP, RLS, who presents with left index finger infection.  Patient states that he injured her left fingertip 8 months ago when he accidentally cut off the tips of his left pointer and long finger with a butcher knife. He did not seek for medical care at that time. He states that over the past 8 months he has been treating it with peroxide and Neosporin. The wound has never healed. It has been worsening progressively over the past month. About 2 weeks ago he noticed that it had swollen to the point that it started draining purulent material. He states that 1 week ago he was pressing on it to get purulent drainage out and saw what he thought was extra purulence in the tip of the finger so he used tweezers to pull however realized that that felt like bone and stopped.  He has mild pain.  No fever or chills.  Patient denies chest pain, shortness breath, cough.  No nausea vomiting, diarrhea, abdominal pain, symptoms of UTI or unilateral weakness.  Labs in the ED notable for WBC 10.6, lactic acid of 2.5, AKI with creatinine 1.40, BUN 24 (creatinine 1.07 on 10/09/2018).  Vitals stable.   Orthopedic surgeon, Dr. Grandville Silos was consulted.  Patient admitted to med/surg bed.  MRI of left hand obtained and showed intense cellulitis, osteomyelitis and septic tenosynovitis.  He is received IV antibiotics.  Plan is for amputation tomorrow, 10/14 with Dr. Grandville Silos.   Assessment & Plan:   Principal Problem:   Finger infection-Left index finger Active Problems:   Hypothyroidism   Hypertension   Hyperlipidemia   Diabetes mellitus without complication (HCC)   Depression   AKI (acute kidney injury) ([place])   Lactic acidosis   OSA on CPAP   RLS (restless  legs syndrome)    Cellulitis of the Left 2nd digit Osteomyelitis of same Septic tenosynovitis of same Hemodynamically stable, not septic.  Ortho, Dr. Grandville Silos plans for amputation tomorrow.   - continue empiric antibiotics - Vanc/Zosyn for now - consult ID for antibiotic recs on d/c - Zofran PRN - follow up blood cultures - IVF's completed, stopped - wound care consult - ESR and CRP   Hypertension: - Hold HCTZ and Cozaar due to AKI - Increase home amlodipine from 5 mg to 10 mg daily - POhydralazine PRN  Hypothyroidism: - continue home Synthroid  Hyperlipidemia: - continue home Lipitor  Diabetes mellitus without complication ([place]): T0G 6.9. Home regimen is Humalog, Actos, metformin, glipizide, Lantus, Ozempic.  Blood sugar 236 -->141 - Lantus dose decreased from 50 to 20 unit daily - sliding scale  Depression: no SI or HI - continue home Paxil  AKI (acute kidney injury) ([place]): Creatinine was 1.07 on 10/09/2018. His creatinine is 1.40, BUN 24.  Likely due to dehydration and continuation of HCTZ and Cozaar. - Avoid using renal toxic medication, such as NSAIDs - received IV fluids, repeat BMP in AM   Lactic acidosis: Lactic acid 2.5 on admission.  Afebrile, WBC 10.6.  Clinically not septic.  May be due to dehydration and use of metformin. - IV fluid as above -Trend LA  OSA: - on CPAP   RLS (restless legs syndrome): - continue home Pramipexole   DVT prophylaxis: SCD's Code Status:   Code Status: Full Code  Family Communication: son at bedside Disposition Plan: home pending ortho clearance, expect 1-2 days   Consultants:   Orthopedics, Dr. Grandville Silos  Procedures:  None, surgery tomorrow  Antimicrobials:   Vancomycin  Zosyn    Subjective: Patient awake laying in bed, son at bedside.  Reports minimal pain, but some increase in erythema of his hand.  Denies fever or chills, chest pain, SOB, N/V/D or other acute complaints.  Objective: Vitals:    12/31/18 0115 12/31/18 0152 12/31/18 0355 12/31/18 1538  BP: (!) 143/62 (!) 175/63 119/89 (!) 166/66  Pulse: 62 72 63 65  Resp: 16 16 17 16   Temp:  98.5 F (36.9 C) 98.2 F (36.8 C) 98.8 F (37.1 C)  TempSrc:  Oral Oral Oral  SpO2: 94% 97% 97% 96%    Intake/Output Summary (Last 24 hours) at 12/31/2018 1748 Last data filed at 12/31/2018 1550 Gross per 24 hour  Intake 1460 ml  Output -  Net 1460 ml   There were no vitals filed for this visit.  Examination:  General exam: morbidly obese, awake, alert, no acute distress Respiratory system: Clear to auscultation. Respiratory effort normal. Cardiovascular system: S1 & S2 heard, RRR. Bilateral lower extremity edema. Gastrointestinal system: Abdomen is nondistended, soft and nontender. Normal bowel sounds heard. Central nervous system: Alert and oriented. No gross focal neurological deficits. Extremities: moves all, no cyanosis Skin: dry, intact, warm to touch on left hand and forearm, otherwise normal temperature Psychiatry: Judgement and insight appear normal. Mood & affect appropriate.     Data Reviewed: I have personally reviewed following labs and imaging studies  CBC: Recent Labs  Lab 12/30/18 1336 12/31/18 0201  WBC 10.6* 8.6  NEUTROABS 8.6*  --   HGB 11.8* 10.5*  HCT 37.7* 32.4*  MCV 90.4 88.5  PLT 334 160   Basic Metabolic Panel: Recent Labs  Lab 12/30/18 1336 12/31/18 0201  NA 133* 135  K 4.2 4.0  CL 96* 99  CO2 25 23  GLUCOSE 236* 228*  BUN 24* 23  CREATININE 1.40* 1.26*  CALCIUM 8.5* 8.2*   GFR: CrCl cannot be calculated (Unknown ideal weight.). Liver Function Tests: Recent Labs  Lab 12/30/18 1336  AST 18  ALT 13  ALKPHOS 76  BILITOT 0.5  PROT 7.4  ALBUMIN 2.8*   No results for input(s): LIPASE, AMYLASE in the last 168 hours. No results for input(s): AMMONIA in the last 168 hours. Coagulation Profile: Recent Labs  Lab 12/30/18 2051  INR 1.1   Cardiac Enzymes: No results for  input(s): CKTOTAL, CKMB, CKMBINDEX, TROPONINI in the last 168 hours. BNP (last 3 results) No results for input(s): PROBNP in the last 8760 hours. HbA1C: Recent Labs    12/30/18 2051 12/31/18 0200  HGBA1C 6.8* 6.9*   CBG: Recent Labs  Lab 12/30/18 2227 12/31/18 0636 12/31/18 1209 12/31/18 1211 12/31/18 1639  GLUCAP 141* 171* <10* 181* 218*   Lipid Profile: No results for input(s): CHOL, HDL, LDLCALC, TRIG, CHOLHDL, LDLDIRECT in the last 72 hours. Thyroid Function Tests: No results for input(s): TSH, T4TOTAL, FREET4, T3FREE, THYROIDAB in the last 72 hours. Anemia Panel: No results for input(s): VITAMINB12, FOLATE, FERRITIN, TIBC, IRON, RETICCTPCT in the last 72 hours. Sepsis Labs: Recent Labs  Lab 12/30/18 1430 12/30/18 1710 12/30/18 2051 12/31/18 0106  PROCALCITON  --   --  <0.10  --   LATICACIDVEN 2.5* 1.8 1.1 1.0    Recent Results (from the past 240 hour(s))  SARS Coronavirus 2 by RT PCR (hospital order,  performed in [facility] hospital lab) Nasopharyngeal Nasopharyngeal Swab     Status: None   Collection Time: 12/30/18  4:22 PM   Specimen: Nasopharyngeal Swab  Result Value Ref Range Status   SARS Coronavirus 2 NEGATIVE NEGATIVE Final    Comment: (NOTE) If result is NEGATIVE SARS-CoV-2 target nucleic acids are NOT DETECTED. The SARS-CoV-2 RNA is generally detectable in upper and lower  respiratory specimens during the acute phase of infection. The lowest  concentration of SARS-CoV-2 viral copies this assay can detect is 250  copies / mL. A negative result does not preclude SARS-CoV-2 infection  and should not be used as the sole basis for treatment or other  patient management decisions.  A negative result may occur with  improper specimen collection / handling, submission of specimen other  than nasopharyngeal swab, presence of viral mutation(s) within the  areas targeted by this assay, and inadequate number of viral copies  (<250 copies / mL). A negative  result must be combined with clinical  observations, patient history, and epidemiological information. If result is POSITIVE SARS-CoV-2 target nucleic acids are DETECTED. The SARS-CoV-2 RNA is generally detectable in upper and lower  respiratory specimens dur ing the acute phase of infection.  Positive  results are indicative of active infection with SARS-CoV-2.  Clinical  correlation with patient history and other diagnostic information is  necessary to determine patient infection status.  Positive results do  not rule out bacterial infection or co-infection with other viruses. If result is PRESUMPTIVE POSTIVE SARS-CoV-2 nucleic acids MAY BE PRESENT.   A presumptive positive result was obtained on the submitted specimen  and confirmed on repeat testing.  While 2019 novel coronavirus  (SARS-CoV-2) nucleic acids may be present in the submitted sample  additional confirmatory testing may be necessary for epidemiological  and / or clinical management purposes  to differentiate between  SARS-CoV-2 and other Sarbecovirus currently known to infect humans.  If clinically indicated additional testing with an alternate test  methodology [phone]) is advised. The SARS-CoV-2 RNA is generally  detectable in upper and lower respiratory sp ecimens during the acute  phase of infection. The expected result is Negative. Fact Sheet for Patients:  StrictlyIdeas.no Fact Sheet for Healthcare Providers: BankingDealers.co.za This test is not yet approved or cleared by the [place] FDA and has been authorized for detection and/or diagnosis of SARS-CoV-2 by FDA under an Emergency Use Authorization (EUA).  This EUA will remain in effect (meaning this test can be used) for the duration of the COVID-19 declaration under Section 564(b)(1) of the Act, 21 U.S.C. section 360bbb-3(b)(1), unless the authorization is terminated or revoked sooner. Performed at [facility], [place] [address], [place], [place] [address]   Culture, blood (routine x 2)     Status: None (Preliminary result)   Collection Time: 12/30/18  5:10 PM   Specimen: BLOOD  Result Value Ref Range Status   Specimen Description BLOOD RIGHT ANTECUBITAL  Final   Special Requests   Final    BOTTLES DRAWN AEROBIC AND ANAEROBIC Blood Culture results may not be optimal due to an inadequate volume of blood received in culture bottles   Culture   Final    NO GROWTH < 24 HOURS Performed at [facility], [place] [address], [place], [place] [address]    Report Status PENDING  Incomplete  Culture, blood (routine x 2)     Status: None (Preliminary result)   Collection Time: 12/30/18  5:10 PM  Specimen: BLOOD  Result Value Ref Range Status   Specimen Description BLOOD LEFT ANTECUBITAL  Final   Special Requests   Final    AEROBIC BOTTLE ONLY Blood Culture results may not be optimal due to an inadequate volume of blood received in culture bottles   Culture   Final    NO GROWTH < 24 HOURS Performed at [place] [address], [place], [place] [address]    Report Status PENDING  Incomplete  Aerobic/Anaerobic Culture (surgical/deep wound)     Status: None (Preliminary result)   Collection Time: 12/30/18  5:25 PM   Specimen: Wound; Abscess  Result Value Ref Range Status   Specimen Description WOUND LEFT FINGER  Final   Special Requests NONE  Final   Gram Stain   Final    RARE WBC PRESENT, PREDOMINANTLY PMN RARE GRAM POSITIVE COCCI    Culture   Final    CULTURE REINCUBATED FOR BETTER GROWTH Performed at [facility], [place] [address] [place], [place] [address]    Report Status PENDING  Incomplete  MRSA PCR Screening     Status: None   Collection Time: 12/31/18  2:30 AM   Specimen: Nasal Mucosa; Nasopharyngeal  Result Value Ref Range Status   MRSA by PCR NEGATIVE NEGATIVE Final    Comment:        The GeneXpert MRSA Assay (FDA approved for NASAL specimens only),  is one component of a comprehensive MRSA colonization surveillance program. It is not intended to diagnose MRSA infection nor to guide or monitor treatment for MRSA infections. Performed at [facility], [place] [address], [place], [place] [address]          Radiology Studies: Mr Hand Left W Wo Contrast  Result Date: 12/31/2018 CLINICAL DATA:  Nonhealing wounds since the patient suffered a laceration on the left index and long fingers with a butcher knife 8 months ago. Purulent drainage for 1 week. EXAM: MRI OF THE LEFT HAND WITHOUT AND WITH CONTRAST TECHNIQUE: Multiplanar, multisequence MR imaging of the left hand was performed before and after the administration of intravenous contrast. CONTRAST:  10 mL GADAVIST IV SOLN COMPARISON:  Plain films left hand 12/30/2018. FINDINGS: Bones/Joint/Cartilage Decreased T1 signal, increased T2 signal and postcontrast enhancement are seen throughout the middle and distal phalanges the index finger consistent osteomyelitis. Osteolysis the tuft of the index finger is identified and there is a transverse pathologic fracture through the base of the distal phalanx. There is loss of fat saturation in the middle and distal phalanges of the long, ring and little fingers without corresponding decreased T1 signal to suggest osteomyelitis. Small cyst or enchondroma in the head of the third metacarpal is incidentally noted. Ligaments Intact. Muscles and Tendons There is edema and enhancement of the sheath of the flexor tendons of the index finger and fluid in the sheath. The flexor digitorum profundus is torn and retracted just proximal to the head the second metacarpal. Soft tissues Extensive subcutaneous edema and enhancement are present about the index finger consistent with cellulitis. A skin wound is seen on the volar surface of the index finger at the level of the PIP joint. A fluid collection measuring 0.4 cm transverse x 0.6 cm AP x 4.5 cm craniocaudal tracks  in the subcutaneous tissues from the PIP joint to the second MCP joint and is consistent with abscess. IMPRESSION: Intense cellulitis about the index finger with an abscess in the volar soft tissues tracking from a skin  wound at the level of the PIP joint to approximately the level of the second MCP joint. Osteomyelitis throughout the middle and distal phalanges of the index finger. Osteolysis of the tuft of the distal phalanx of the index finger and pathologic transverse fracture through the proximal metaphysis of the distal phalanx are seen. Septic tenosynovitis with a complete tear of the flexor digitorum profundus tendon. The tendon is retracted just proximal to the articular surface of the head of the second metacarpal. Electronically Signed   By: Inge Rise M.D.   On: 12/31/2018 07:28   Dg Hand Complete Left  Result Date: 12/30/2018 CLINICAL DATA:  Left index finger pain for 2 weeks. Cut the tips of index finger. EXAM: LEFT HAND - COMPLETE 3+ VIEW COMPARISON:  None FINDINGS: Diffuse swelling about the hand but worse in the left index finger. Linear lucency and tuft resorption in the distal phalanx. Lucency at the base of the distal phalanx. Swelling involves the dorsum and volar surface of the hand proximally. IMPRESSION: Marked soft tissue swelling particularly about the index finger with signs of open fracture and/or osteomyelitis of the distal phalanx. No radiopaque foreign body. Electronically Signed   By: Zetta Bills M.D.   On: 12/30/2018 14:27        Scheduled Meds: . amLODipine  10 mg Oral Daily  . aspirin EC  81 mg Oral Daily  . atorvastatin  20 mg Oral Daily  . insulin aspart  0-9 Units Subcutaneous TID WC  . insulin glargine  20 Units Subcutaneous QHS  . levothyroxine  137 mcg Oral QAC breakfast  . PARoxetine  20 mg Oral [id number]  . pramipexole  1 mg Oral QHS  . sodium chloride flush  3 mL Intravenous Once  . vitamin B-12  1,000 mcg Oral Daily   Continuous Infusions: .  sodium chloride Stopped (12/30/18 2206)  . vancomycin       LOS: 1 day    Time spent: 30-35 min    Ezekiel Slocumb, MD [facility] Pager [phone]  If 7PM-7AM, please contact night-coverage [email or web address] Password [facility] 12/31/2018, 5:48 PM

## 2018-12-31 NOTE — Progress Notes (Signed)
Patient is on NIV at this time. Patient is unaware of home settings. Settings were adjusted per [facility] NIV protocol and patient comfort. RN aware that patient is on NIV. No distress or complications noted.

## 2018-12-31 NOTE — Plan of Care (Signed)

## 2018-12-31 NOTE — Plan of Care (Signed)
  Problem: Education: Goal: Knowledge of General Education information will improve Description Including pain rating scale, medication(s)/side effects and non-pharmacologic comfort measures Outcome: Progressing   Problem: Health Behavior/Discharge Planning: Goal: Ability to manage health-related needs will improve Outcome: Progressing   

## 2019-01-01 ENCOUNTER — Inpatient Hospital Stay (HOSPITAL_COMMUNITY): Payer: PPO | Admitting: Certified Registered"

## 2019-01-01 ENCOUNTER — Encounter (HOSPITAL_COMMUNITY)
Admission: EM | Disposition: A | Discharge status: Home Health | Payer: Self-pay | Source: Home / Self Care | Attending: Internal Medicine

## 2019-01-01 HISTORY — PX: AMPUTATION: SHX166

## 2019-01-01 LAB — GLUCOSE, CAPILLARY
Glucose-Capillary: 174 mg/dL — ABNORMAL HIGH (ref 70–99)
Glucose-Capillary: 194 mg/dL — ABNORMAL HIGH (ref 70–99)
Glucose-Capillary: 191 mg/dL — ABNORMAL HIGH (ref 70–99)
Glucose-Capillary: 236 mg/dL — ABNORMAL HIGH (ref 70–99)
Glucose-Capillary: 298 mg/dL — ABNORMAL HIGH (ref 70–99)

## 2019-01-01 LAB — CBC WITH DIFFERENTIAL/PLATELET
Abs Immature Granulocytes: 0.02 10*3/uL (ref 0.00–0.07)
Basophils Absolute: 0 10*3/uL (ref 0.0–0.1)
Basophils Relative: 0 %
Eosinophils Absolute: 0.2 10*3/uL (ref 0.0–0.5)
Eosinophils Relative: 3 %
HCT: 32.8 % — ABNORMAL LOW (ref 39.0–52.0)
Hemoglobin: 10.7 g/dL — ABNORMAL LOW (ref 13.0–17.0)
Immature Granulocytes: 0 %
Lymphocytes Relative: 13 %
Lymphs Abs: 0.9 10*3/uL (ref 0.7–4.0)
MCH: 28.3 pg (ref 26.0–34.0)
MCHC: 32.6 g/dL (ref 30.0–36.0)
MCV: 86.8 fL (ref 80.0–100.0)
Monocytes Absolute: 0.7 10*3/uL (ref 0.1–1.0)
Monocytes Relative: 9 %
Neutro Abs: 5.4 10*3/uL (ref 1.7–7.7)
Neutrophils Relative %: 75 %
Platelets: 274 10*3/uL (ref 150–400)
RBC: 3.78 MIL/uL — ABNORMAL LOW (ref 4.22–5.81)
RDW: 13.2 % (ref 11.5–15.5)
WBC: 7.2 10*3/uL (ref 4.0–10.5)
nRBC: 0 % (ref 0.0–0.2)

## 2019-01-01 LAB — BASIC METABOLIC PANEL
Anion gap: 8 (ref 5–15)
BUN: 16 mg/dL (ref 8–23)
CO2: 25 mmol/L (ref 22–32)
Calcium: 8.3 mg/dL — ABNORMAL LOW (ref 8.9–10.3)
Chloride: 104 mmol/L (ref 98–111)
Creatinine, Ser: 1.09 mg/dL (ref 0.61–1.24)
GFR calc Af Amer: >60 mL/min (ref >60)
GFR calc non Af Amer: >60 mL/min (ref >60)
Glucose, Bld: 191 mg/dL — ABNORMAL HIGH (ref 70–99)
Potassium: 4.2 mmol/L (ref 3.5–5.1)
Sodium: 137 mmol/L (ref 135–145)

## 2019-01-01 LAB — C-REACTIVE PROTEIN: CRP: 8.5 mg/dL — ABNORMAL HIGH (ref <1.0)

## 2019-01-01 LAB — SEDIMENTATION RATE: Sed Rate: 109 mm/hr — ABNORMAL HIGH (ref 0–16)

## 2019-01-01 LAB — UREA NITROGEN, URINE: Urea Nitrogen, Ur: 1057 mg/dL

## 2019-01-01 SURGERY — AMPUTATION DIGIT
Anesthesia: General | Site: Hand | Laterality: Left

## 2019-01-01 MED ORDER — PROPOFOL 10 MG/ML IV BOLUS
INTRAVENOUS | Status: DC | PRN
  Administered 2019-01-01: 200 mg via INTRAVENOUS

## 2019-01-01 MED ORDER — PIPERACILLIN-TAZOBACTAM 3.375 G IVPB
3.3750 g | Freq: Three times a day (TID) | INTRAVENOUS | Status: DC
  Administered 2019-01-01 – 2019-01-02 (×3): 3.375 g via INTRAVENOUS
  Filled 2019-01-01 (×3): qty 50

## 2019-01-01 MED ORDER — DEXTROSE 5 % IV SOLN
INTRAVENOUS | Status: DC | PRN
  Administered 2019-01-01: 09:00:00 3 g via INTRAVENOUS

## 2019-01-01 MED ORDER — FENTANYL CITRATE (PF) 250 MCG/5ML IJ SOLN
INTRAMUSCULAR | Status: AC
  Filled 2019-01-01: qty 5

## 2019-01-01 MED ORDER — LIDOCAINE HCL (PF) 1 % IJ SOLN
INTRAMUSCULAR | Status: AC
  Filled 2019-01-01: qty 30

## 2019-01-01 MED ORDER — STERILE WATER FOR IRRIGATION IR SOLN
Status: DC | PRN
  Administered 2019-01-01: 500 mL

## 2019-01-01 MED ORDER — BUPIVACAINE-EPINEPHRINE 0.5% -1:200000 IJ SOLN
INTRAMUSCULAR | Status: DC | PRN
  Administered 2019-01-01: 50 mL

## 2019-01-01 MED ORDER — 0.9 % SODIUM CHLORIDE (POUR BTL) OPTIME
TOPICAL | Status: DC | PRN
  Administered 2019-01-01: 1000 mL

## 2019-01-01 MED ORDER — FENTANYL CITRATE (PF) 250 MCG/5ML IJ SOLN
INTRAMUSCULAR | Status: DC | PRN
  Administered 2019-01-01 (×6): 50 ug via INTRAVENOUS

## 2019-01-01 MED ORDER — PROPOFOL 10 MG/ML IV BOLUS
INTRAVENOUS | Status: AC
  Filled 2019-01-01: qty 20

## 2019-01-01 MED ORDER — LIDOCAINE 2% (20 MG/ML) 5 ML SYRINGE
INTRAMUSCULAR | Status: DC | PRN
  Administered 2019-01-01: 100 mg via INTRAVENOUS

## 2019-01-01 MED ORDER — LIDOCAINE 2% (20 MG/ML) 5 ML SYRINGE
INTRAMUSCULAR | Status: AC
  Filled 2019-01-01: qty 5

## 2019-01-01 MED ORDER — LACTATED RINGERS IV SOLN
INTRAVENOUS | Status: DC | PRN
  Administered 2019-01-01: 08:00:00 via INTRAVENOUS

## 2019-01-01 MED ORDER — CEFAZOLIN SODIUM 1 G IJ SOLR
INTRAMUSCULAR | Status: AC
  Filled 2019-01-01: qty 30

## 2019-01-01 MED ORDER — BUPIVACAINE-EPINEPHRINE (PF) 0.5% -1:200000 IJ SOLN
INTRAMUSCULAR | Status: AC
  Filled 2019-01-01: qty 30

## 2019-01-01 MED ORDER — BUPIVACAINE-EPINEPHRINE 0.5% -1:200000 IJ SOLN
INTRAMUSCULAR | Status: AC
  Filled 2019-01-01: qty 1

## 2019-01-01 SURGICAL SUPPLY — 54 items
APL PRP STRL LF DISP 70% ISPRP (MISCELLANEOUS) ×1
APL SKNCLS STERI-STRIP NONHPOA (GAUZE/BANDAGES/DRESSINGS) ×2
BENZOIN TINCTURE PRP APPL 2/3 (GAUZE/BANDAGES/DRESSINGS) ×4 IMPLANT
BLADE AVERAGE 25MMX9MM (BLADE)
BLADE AVERAGE 25X9 (BLADE) IMPLANT
BNDG CMPR 9X4 STRL LF SNTH (GAUZE/BANDAGES/DRESSINGS)
BNDG COHESIVE 2X5 TAN STRL LF (GAUZE/BANDAGES/DRESSINGS) IMPLANT
BNDG COHESIVE 4X5 TAN STRL (GAUZE/BANDAGES/DRESSINGS) ×2 IMPLANT
BNDG CONFORM 2 STRL LF (GAUZE/BANDAGES/DRESSINGS) IMPLANT
BNDG CONFORM 3 STRL LF (GAUZE/BANDAGES/DRESSINGS) ×3 IMPLANT
BNDG ELASTIC 2X5.8 VLCR STR LF (GAUZE/BANDAGES/DRESSINGS) IMPLANT
BNDG ESMARK 4X9 LF (GAUZE/BANDAGES/DRESSINGS) IMPLANT
BNDG GAUZE ELAST 4 BULKY (GAUZE/BANDAGES/DRESSINGS) ×4 IMPLANT
CHLORAPREP W/TINT 26 (MISCELLANEOUS) ×3 IMPLANT
CONT SPEC 4OZ CLIKSEAL STRL BL (MISCELLANEOUS) ×2 IMPLANT
CORD BIPOLAR FORCEPS 12FT (ELECTRODE) ×3 IMPLANT
COVER SURGICAL LIGHT HANDLE (MISCELLANEOUS) ×2 IMPLANT
COVER WAND RF STERILE (DRAPES) ×3 IMPLANT
DRAPE SURG 17X23 STRL (DRAPES) ×3 IMPLANT
DRSG EMULSION OIL 3X3 NADH (GAUZE/BANDAGES/DRESSINGS) ×3 IMPLANT
ELECT REM PT RETURN 9FT ADLT (ELECTROSURGICAL) ×3
ELECTRODE REM PT RTRN 9FT ADLT (ELECTROSURGICAL) ×1 IMPLANT
GAUZE SPONGE 4X4 12PLY STRL (GAUZE/BANDAGES/DRESSINGS) ×3 IMPLANT
GAUZE SPONGE 4X4 12PLY STRL LF (GAUZE/BANDAGES/DRESSINGS) ×2 IMPLANT
GAUZE XEROFORM 1X8 LF (GAUZE/BANDAGES/DRESSINGS) ×3 IMPLANT
GLOVE BIO SURGEON STRL SZ 6.5 (GLOVE) ×1 IMPLANT
GLOVE BIO SURGEON STRL SZ7.5 (GLOVE) ×5 IMPLANT
GLOVE BIO SURGEONS STRL SZ 6.5 (GLOVE) ×1
GLOVE BIOGEL PI IND STRL 8 (GLOVE) ×1 IMPLANT
GLOVE BIOGEL PI INDICATOR 8 (GLOVE) ×2
GOWN STRL REUS W/ TWL XL LVL3 (GOWN DISPOSABLE) ×1 IMPLANT
GOWN STRL REUS W/TWL XL LVL3 (GOWN DISPOSABLE) ×9
KIT BASIN OR (CUSTOM PROCEDURE TRAY) ×3 IMPLANT
NDL HYPO 25X1 1.5 SAFETY (NEEDLE) IMPLANT
NEEDLE HYPO 25X1 1.5 SAFETY (NEEDLE) IMPLANT
PACK ORTHO EXTREMITY (CUSTOM PROCEDURE TRAY) ×3 IMPLANT
PAD CAST 4YDX4 CTTN HI CHSV (CAST SUPPLIES) IMPLANT
PADDING CAST ABS 4INX4YD NS (CAST SUPPLIES) ×2
PADDING CAST ABS COTTON 4X4 ST (CAST SUPPLIES) ×1 IMPLANT
PADDING CAST COTTON 4X4 STRL (CAST SUPPLIES) ×3
PENCIL BUTTON HOLSTER BLD 10FT (ELECTRODE) IMPLANT
SUCTION FRAZIER TIP 8 FR DISP (SUCTIONS) ×2
SUCTION TUBE FRAZIER 8FR DISP (SUCTIONS) IMPLANT
SUT ETHILON 4 0 PS 2 18 (SUTURE) IMPLANT
SUT MNCRL AB 4-0 PS2 18 (SUTURE) IMPLANT
SUT PROLENE 4 0 P 3 18 (SUTURE) ×2 IMPLANT
SUT VICRYL RAPIDE 4/0 PS 2 (SUTURE) IMPLANT
SWAB COLLECTION DEVICE MRSA (MISCELLANEOUS) ×2 IMPLANT
SWAB CULTURE LIQ STUART DBL (MISCELLANEOUS) ×2 IMPLANT
SYR 10ML LL (SYRINGE) IMPLANT
TOWEL GREEN STERILE FF (TOWEL DISPOSABLE) ×3 IMPLANT
TUBE CONNECTING 12'X1/4 (SUCTIONS) ×1
TUBE CONNECTING 12X1/4 (SUCTIONS) ×1 IMPLANT
UNDERPAD 30X30 (UNDERPADS AND DIAPERS) ×3 IMPLANT

## 2019-01-01 NOTE — Transfer of Care (Signed)
Immediate Anesthesia Transfer of Care Note  Patient: Ronald Heath  Procedure(s) Performed: AMPUTATION LEFT INDEX FINGER (Left Hand)  Patient Location: PACU  Anesthesia Type:General  Level of Consciousness: awake, alert  and oriented  Airway & Oxygen Therapy: Patient Spontanous Breathing  Post-op Assessment: Report given to RN, Post -op Vital signs reviewed and stable and Patient moving all extremities  Post vital signs: Reviewed and stable  Last Vitals:  Vitals Value Taken Time  BP 142/58 01/01/19 0940  Temp    Pulse 70 01/01/19 0940  Resp    SpO2 94 % 01/01/19 0940  Vitals shown include unvalidated device data.  Last Pain:  Vitals:   01/01/19 0445  TempSrc: Oral  PainSc:          Complications: No apparent anesthesia complications

## 2019-01-01 NOTE — Progress Notes (Signed)
Visit made to patients room to discuss CPAP.  Patient states he will self administer CPAP tonight when he gets ready for bed.  I advised if he needs assistance to have RN call RT.

## 2019-01-01 NOTE — Progress Notes (Signed)
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 Remaining tissues at base of digit still clearly cellulitic No gross pus remaining Articular cartilage left on distal aspect of 2nd [facility]  Surgical Recommendations:  1. I will remain involved with patient and take responsibility for seeing that the wound closes, sutures are removed at appropriate time as outpatient, and determine/oversee any need for therapy as outpatient for functional recovery. Patient should follow-up in 2 weeks with me as outpatient to address these issues. I have ordered dressing changes to begin tomorrow.  Please feel free to reach out as needed while patient is inpatient if additional procedural services are sought.  I will be physically away (but largely reachable) Friday through Monday.  2. Although hospitalist service is presently providing care for the infection, outpatient transition of care for his infection will be required (since [facility] is without outpatient practice).  Options include ID involvement or transition to PCP.  I would suspect he will not have had sufficient clinical improvement to warrant discharge from inpatient status earlier than Friday.  Micheline Rough, MD [facility] [phone]

## 2019-01-01 NOTE — Discharge Instructions (Signed)
Change bandages daily until no more drainage, then may stop dressing changes  After discharge, Dr. Grandville Silos will remain responsible for your wound/stitches and any therapy needs.  After discharge, your primary doctor will assume responsibility for ensuring your infection resolves

## 2019-01-01 NOTE — Progress Notes (Signed)
Inpatient Diabetes Program Recommendations  AACE/ADA: New Consensus Statement on Inpatient Glycemic Control (2015)  Target Ranges:  Prepandial:   less than 140 mg/dL      Peak postprandial:   less than 180 mg/dL (1-2 hours)      Critically ill patients:  140 - 180 mg/dL   Lab Results  Component Value Date   GLUCAP 194 (H) 01/01/2019   HGBA1C 6.9 (H) 12/31/2018    Review of Glycemic Control Results for Ronald Heath, Ronald Heath (MRN [id number]) as of 01/01/2019 11:58  Ref. Range 12/31/2018 12:11 12/31/2018 16:39 12/31/2018 20:56 01/01/2019 06:52 01/01/2019 09:40  Glucose-Capillary Latest Ref Range: 70 - 99 mg/dL 181 (H) 218 (H) 245 (H) 174 (H) 194 (H)   Diabetes history: DM 2 Outpatient Diabetes medications:  Glucotrol XL 10 mg daily, Humalog 15 units tid with meals, Lantus 50 units q HS, Metformin 1000 mg bid, Actos 15 md daily, Ozempic 0.25 weekly Current orders for Inpatient glycemic control:  Lantus 20 units q HS, Novolog sensitive tid with meals  Inpatient Diabetes Program Recommendations:    Please consider adding Novolog meal coverage 3 units tid with meals (hold if patient eats less than 50%).   Thanks  Adah Perl, RN, BC-ADM Inpatient Diabetes Coordinator Pager [phone] (8a-5p)

## 2019-01-01 NOTE — Anesthesia Procedure Notes (Signed)
Procedure Name: LMA Insertion Date/Time: 01/01/2019 8:42 AM Performed by: Amadeo Garnet, CRNA Pre-anesthesia Checklist: Patient identified, Emergency Drugs available, Suction available, Timeout performed and Patient being monitored Patient Re-evaluated:Patient Re-evaluated prior to induction Oxygen Delivery Method: Circle system utilized Preoxygenation: Pre-oxygenation with 100% oxygen Induction Type: IV induction LMA: LMA inserted LMA Size: 5.0 Number of attempts: 1 Dental Injury: Teeth and Oropharynx as per pre-operative assessment

## 2019-01-01 NOTE — Progress Notes (Signed)
PROGRESS NOTE  Ronald Heath GYB:[id number] DOB: 1945/04/29 DOA: 12/30/2018 PCP: Sinda Du, MD  Brief History   73 y.o.malewith medical history significant ofhypertension, hyperlipidemia, diabetes mellitus, hypothyroidism, depression, OSA on CPAP, RLS, who presents with left index finger infection.  Patient states that he injured her left fingertip 8 months ago when he accidentally cut off the tips of his left pointer and long finger with a butcher knife. He did not seek formedical care at that time. He states that over the past 8 months he has been treating it with peroxide and Neosporin.The wound has never healed. It has been worseningprogressively over the past month. About 2 weeks ago he noticed that it had swollen to the point that it started draining purulent material. He states that 1 week ago he was pressing on it to get purulent drainage out and saw what he thought was extra purulence in the tip of the finger so he used tweezers to pull however realized that that felt like bone and stopped.He has mild pain. No fever or chills. Patient denies chest pain, shortness breath, cough.No nausea vomiting, diarrhea, abdominal pain, symptoms of UTI or unilateral weakness.  Labs in the ED notable for WBC 10.6, lactic acid of 2.5, AKI with creatinine 1.40, BUN 24 (creatinine 1.07 on 10/09/2018).  Vitals stable.  Orthopedic surgeon, Dr. Grandville Silos was consulted.  Patient admitted to med/surg bed.  MRI of left hand obtained and showed intense cellulitis, osteomyelitis and septic tenosynovitis.  He is received IV antibiotics.  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 Grandville Silos.  He has tolerated the procedure well.  Consultants  . Orthopedic surgery.  Procedures  . Amputation of the second digit of the left hand.  Antibiotics   Anti-infectives (From admission, onward)   Start     Dose/Rate Route Frequency Ordered Stop   01/01/19  1800  piperacillin-tazobactam (ZOSYN) IVPB 3.375 g     3.375 g 12.5 mL/hr over 240 Minutes Intravenous Every 8 hours 01/01/19 1657     12/31/18 2000  vancomycin (VANCOCIN) 2,000 mg in sodium chloride 0.9 % 500 mL IVPB     2,000 mg 250 mL/hr over 120 Minutes Intravenous Every 24 hours 12/30/18 2035     12/30/18 1930  vancomycin (VANCOCIN) 2,500 mg in sodium chloride 0.9 % 500 mL IVPB     2,500 mg 250 mL/hr over 120 Minutes Intravenous  Once 12/30/18 1901 12/30/18 2255   12/30/18 1930  piperacillin-tazobactam (ZOSYN) IVPB 3.375 g     3.375 g 100 mL/hr over 30 Minutes Intravenous  Once 12/30/18 1901 12/30/18 2006   12/30/18 1830  vancomycin (VANCOCIN) IVPB 1000 mg/200 mL premix  Status:  Discontinued     1,000 mg 200 mL/hr over 60 Minutes Intravenous  Once 12/30/18 1827 12/30/18 1827   12/30/18 1830  cefTRIAXone (ROCEPHIN) 2 g in sodium chloride 0.9 % 100 mL IVPB  Status:  Discontinued     2 g 200 mL/hr over 30 Minutes Intravenous  Once 12/30/18 1827 12/30/18 1827    .  Subjective  The patient is seen postoperatively. He is in no acute distress.  Objective   Vitals:  Vitals:   01/01/19 1010 01/01/19 1624  BP: 117/68 (!) 156/61  Pulse: 69 66  Resp: 20 18  Temp: 98.4 F (36.9 C) 98.4 F (36.9 C)  SpO2: 96% 97%   Exam:  Constitutional:  . The patient is awake, alert, and oriented x 3. No acute  distress. Respiratory:  . No increased work of breathing. . No wheezes, rales, or rhonchi . No tactile fremitus Cardiovascular:  . Regular rate and rhythm . No murmurs, ectopy, or gallups. . No lateral PMI. No thrills. Abdomen:  . Abdomen is soft, non-tender, non-distended . No hernias, masses, or organomegaly . Normoactive bowel sounds.  Musculoskeletal:  . No cyanosis, clubbing, or edema . Left hand is bandaged. Skin:  . No rashes, lesions, ulcers . palpation of skin: no induration or nodules Neurologic:  . CN 2-12 intact . Sensation all 4 extremities intact Psychiatric:   . Mental status o Mood, affect appropriate o Orientation to person, place, time  . judgment and insight appear intact  I have personally reviewed the following:   Today's Data  . Vitals, BMP, CBC  Micro Data  . Aerobic/anaerobic cultures from left finger: Eikenella and Staph aureus.   Scheduled Meds: . amLODipine  10 mg Oral Daily  . aspirin EC  81 mg Oral Daily  . atorvastatin  20 mg Oral Daily  . insulin aspart  0-9 Units Subcutaneous TID WC  . insulin glargine  20 Units Subcutaneous QHS  . levothyroxine  137 mcg Oral QAC breakfast  . PARoxetine  20 mg Oral BH-q7a  . pramipexole  1 mg Oral QHS  . sodium chloride flush  3 mL Intravenous Once  . vitamin B-12  1,000 mcg Oral Daily   Continuous Infusions: . piperacillin-tazobactam (ZOSYN)  IV    . vancomycin 2,000 mg (12/31/18 2150)    Principal Problem:   Finger infection-Left index finger Active Problems:   Hypothyroidism   Hypertension   Hyperlipidemia   Diabetes mellitus without complication (HCC)   Depression   AKI (acute kidney injury) ([place])   Lactic acidosis   OSA on CPAP   RLS (restless legs syndrome)   Osteomyelitis (HCC)   LOS: 2 days   A & P   Cellulitis of the Left 2nd digit with Osteomyelitis and Septic tenosynovitis of same: The patient is seen after amputation of digit by Dr. Grandville Silos. He has tolerated the procedure well. He will be continued on Zosyn for now as cultures have grown out Eikenella and Staph aureus. Will consult ID for antibiotic recs on d/c. Follow ESR and CRP. Wound care consult.  Hypertension: Hold HCTZand Cozaar due to AKI. Home amlodipine has been increased from 5 mg to 10 mg daily with oral hydralazine PRN.  Hypothyroidism: Continue home dose of Synthroid.  Hyperlipidemia: Continue home Lipitor.  Diabetes mellitus without complication ([place]): W2X 6.9. Home regimen is Humalog, Actos, metformin, glipizide, Lantus, Ozempic.Blood sugar 236 -->141 Lantus dose decreased  from50 to 20 unit daily due to patient's NPO status overnight. Will increase back towards baseline. He is also being covered by SSI.  Depression:No SI or HI. Continue home Paxil.  AKI (acute kidney injury) (HCC):Creatinine was 1.07 on 10/09/2018. His creatinine is 1.40, BUN 24. Likely due to dehydration and continuation of HCTZ and Cozaar. Avoid nephrotoxic medications and hypotension. Monitor creatinine, electrolytes, and volume status.  Lactic acidosis:Lactic acid 2.5 on admission. Resolved.   OSA: on CPAP  RLS (restless legs syndrome): Continue home Pramipexole  I have seen and examined this patient myself. I have spent 35 minutes in his evaluation and care.  DVT prophylaxis: SCD's Code Status:   Code Status: Full Code  Family Communication: no family available Disposition Plan: home pending ortho clearance, expect 1-2 days  Buster Schueller, DO [facility] Direct contact: see [email or web address]  7PM-7AM contact night  coverage as above 01/01/2019, 5:43 PM  LOS: 2 days

## 2019-01-01 NOTE — Op Note (Signed)
01/01/2019  9:51 AM  PATIENT:  Ronald Heath  73 y.o. 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 Grandville Silos, MD  PHYSICIAN ASSISTANT: Morley Kos, OPA-C  ANESTHESIA:  general  SPECIMENS: Swabs to microbiology, specimen to pathology  DRAINS:   None  EBL:  less than 50 mL  PREOPERATIVE INDICATIONS:  Ronald Heath is a  73 y.o. male with history of chronic left index finger infection, currently draining pus with MRI evidence of osteomyelitis of multiple phalanges  The risks benefits and alternatives were discussed with the patient preoperatively including but not limited to the risks of infection, bleeding, nerve injury, cardiopulmonary complications, the need for revision surgery, among others, and the patient verbalized understanding and consented to proceed.  OPERATIVE IMPLANTS: None  OPERATIVE PROCEDURE:  The patient was escorted to the operative theatre and placed in a supine position.  General anesthesia was administered.  A surgical "time-out" was performed during which the planned procedure, proposed operative site, and the correct patient identity were compared to the operative consent and agreement confirmed by the circulating nurse according to current facility policy.  Following application of a tourniquet to the operative extremity, the exposed skin was prescrubbed with a Hibiclens scrub brush before being formally prepped with Chloraprep and draped in the usual sterile fashion.  The limb was exsanguinated with gravity and the tourniquet inflated to approximately 111mmHg higher than systolic BP.  A racquet shaped incision was drawn around the base of the digit with the stem of the racquet along the radial margin of the second metacarpal.  Full-thickness flaps were then incised and elevated and cultures  obtained.  After curing the incision down to bone, the capsule of the MP joint was divided and the specimen passed off.  The radial and ulnar neurovascular bundles were identified and the digital nerves transected well proximal, bringing them away from the skin margin ultimately skin scar.  The skin on the volar surface was not entirely healthy, and epidermal desquamation was occurring.  Some of this was further debrided to expose the underlying dermis.  Flexor tendons in the process were pulled distally and divided.  The proximal end of the incision was then extended obliquely across the palm slightly to allow elevation of the flap so that the flexor sheath could be better accessed.  This was further debrided with a rondure and curettes.  The flexor tendons that remained were pulled distally and were more healthy in appearance.  Synovium thickening and proliferation from the flexor sheath was largely removed.  The wound was copiously irrigated and tourniquet released.  Additional hemostasis was obtained with bipolar electrocautery and the wound margins were infiltrated with half percent Marcaine with epinephrine to assist with postoperative pain control and hemostasis.  The skin edges were then again excised to remove redundancy of the flaps and allow for better more conforming insetting.  The flaps were loosely reapproximated with 4-0 Prolene interrupted sutures.  He was awakened and taken to recovery room in stable condition, breathing spontaneously.  DISPOSITION: He will return to the floor, where his infection care will remain under the direction of the hospitalist and ultimately require transition on an outpatient basis to his primary physician.  I will remain responsible for wound closure, stitches, and functional implications such as management of therapy.

## 2019-01-01 NOTE — Anesthesia Postprocedure Evaluation (Signed)
Anesthesia Post Note  Patient: Ronald Heath  Procedure(s) Performed: AMPUTATION LEFT INDEX FINGER (Left Hand)     Patient location during evaluation: PACU Anesthesia Type: General Level of consciousness: awake and alert Pain management: pain level controlled Vital Signs Assessment: post-procedure vital signs reviewed and stable Respiratory status: spontaneous breathing, nonlabored ventilation and respiratory function stable Cardiovascular status: blood pressure returned to baseline and stable Postop Assessment: no apparent nausea or vomiting Anesthetic complications: no    Last Vitals:  Vitals:   01/01/19 0955 01/01/19 1010  BP: 122/78 117/68  Pulse: 68 69  Resp: 20 20  Temp:  36.9 C  SpO2: 95% 96%    Last Pain:  Vitals:   01/01/19 1010  TempSrc:   PainSc: 0-No pain                 Dewell Monnier,W. EDMOND

## 2019-01-02 ENCOUNTER — Encounter (HOSPITAL_COMMUNITY): Payer: Self-pay | Admitting: Orthopedic Surgery

## 2019-01-02 ENCOUNTER — Inpatient Hospital Stay: Payer: Self-pay

## 2019-01-02 DIAGNOSIS — E1169 Type 2 diabetes mellitus with other specified complication: Principal | ICD-10-CM

## 2019-01-02 DIAGNOSIS — G4733 Obstructive sleep apnea (adult) (pediatric): Secondary | ICD-10-CM

## 2019-01-02 DIAGNOSIS — Z89022 Acquired absence of left finger(s): Secondary | ICD-10-CM

## 2019-01-02 DIAGNOSIS — E785 Hyperlipidemia, unspecified: Secondary | ICD-10-CM

## 2019-01-02 DIAGNOSIS — Z87891 Personal history of nicotine dependence: Secondary | ICD-10-CM

## 2019-01-02 DIAGNOSIS — I10 Essential (primary) hypertension: Secondary | ICD-10-CM

## 2019-01-02 DIAGNOSIS — M869 Osteomyelitis, unspecified: Secondary | ICD-10-CM

## 2019-01-02 DIAGNOSIS — B9689 Other specified bacterial agents as the cause of diseases classified elsewhere: Secondary | ICD-10-CM

## 2019-01-02 DIAGNOSIS — A4901 Methicillin susceptible Staphylococcus aureus infection, unspecified site: Secondary | ICD-10-CM

## 2019-01-02 DIAGNOSIS — F329 Major depressive disorder, single episode, unspecified: Secondary | ICD-10-CM

## 2019-01-02 DIAGNOSIS — E039 Hypothyroidism, unspecified: Secondary | ICD-10-CM

## 2019-01-02 DIAGNOSIS — B9561 Methicillin susceptible Staphylococcus aureus infection as the cause of diseases classified elsewhere: Secondary | ICD-10-CM

## 2019-01-02 LAB — GLUCOSE, CAPILLARY
Glucose-Capillary: <10 mg/dL — CL (ref 70–99)
Glucose-Capillary: 197 mg/dL — ABNORMAL HIGH (ref 70–99)
Glucose-Capillary: 258 mg/dL — ABNORMAL HIGH (ref 70–99)
Glucose-Capillary: 234 mg/dL — ABNORMAL HIGH (ref 70–99)
Glucose-Capillary: 226 mg/dL — ABNORMAL HIGH (ref 70–99)

## 2019-01-02 LAB — SURGICAL PATHOLOGY

## 2019-01-02 MED ORDER — SODIUM CHLORIDE 0.9 % IV SOLN
3.0000 g | Freq: Two times a day (BID) | INTRAVENOUS | Status: DC
  Filled 2019-01-02: qty 8

## 2019-01-02 MED ORDER — SODIUM CHLORIDE 0.9 % IV SOLN
3.0000 g | Freq: Four times a day (QID) | INTRAVENOUS | Status: DC
  Filled 2019-01-02 (×3): qty 8

## 2019-01-02 MED ORDER — METRONIDAZOLE 500 MG PO TABS
500.0000 mg | ORAL_TABLET | Freq: Three times a day (TID) | ORAL | Status: DC
  Administered 2019-01-02 – 2019-01-03 (×4): 500 mg via ORAL
  Filled 2019-01-02 (×4): qty 1

## 2019-01-02 MED ORDER — METRONIDAZOLE 500 MG PO TABS: 500.0000 mg | ORAL_TABLET | Freq: Four times a day (QID) | ORAL | Status: DC

## 2019-01-02 MED ORDER — SODIUM CHLORIDE 0.9 % IV SOLN
2.0000 g | INTRAVENOUS | Status: DC
  Administered 2019-01-02 – 2019-01-03 (×2): 2 g via INTRAVENOUS
  Filled 2019-01-02 (×2): qty 20

## 2019-01-02 MED ORDER — INSULIN GLARGINE 100 UNIT/ML ~~LOC~~ SOLN
30.0000 [IU] | Freq: Every day | SUBCUTANEOUS | Status: DC
  Administered 2019-01-02: 30 [IU] via SUBCUTANEOUS
  Filled 2019-01-02 (×2): qty 0.3

## 2019-01-02 NOTE — Plan of Care (Signed)
  Problem: Education: Goal: Knowledge of General Education information will improve Description Including pain rating scale, medication(s)/side effects and non-pharmacologic comfort measures Outcome: Progressing   Problem: Health Behavior/Discharge Planning: Goal: Ability to manage health-related needs will improve Outcome: Progressing   Problem: Clinical Measurements: Goal: Will remain free from infection Outcome: Progressing Goal: Cardiovascular complication will be avoided Outcome: Progressing   Problem: Activity: Goal: Risk for activity intolerance will decrease Outcome: Progressing   Problem: Nutrition: Goal: Adequate nutrition will be maintained Outcome: Progressing   Problem: Coping: Goal: Level of anxiety will decrease Outcome: Progressing   Problem: Elimination: Goal: Will not experience complications related to bowel motility Outcome: Progressing Goal: Will not experience complications related to urinary retention Outcome: Progressing   Problem: Pain Managment: Goal: General experience of comfort will improve Outcome: Progressing   Problem: Safety: Goal: Ability to remain free from injury will improve Outcome: Progressing   Problem: Skin Integrity: Goal: Risk for impaired skin integrity will decrease Outcome: Progressing   

## 2019-01-02 NOTE — Progress Notes (Signed)
PROGRESS NOTE  Ronald Heath KGM:[id number] DOB: December 28, 1945 DOA: 12/30/2018 PCP: Sinda Du, MD  Brief History   73 y.o.malewith medical history significant ofhypertension, hyperlipidemia, diabetes mellitus, hypothyroidism, depression, OSA on CPAP, RLS, who presents with left index finger infection.  Patient states that he injured her left fingertip 8 months ago when he accidentally cut off the tips of his left pointer and long finger with a butcher knife. He did not seek formedical care at that time. He states that over the past 8 months he has been treating it with peroxide and Neosporin.The wound has never healed. It has been worseningprogressively over the past month. About 2 weeks ago he noticed that it had swollen to the point that it started draining purulent material. He states that 1 week ago he was pressing on it to get purulent drainage out and saw what he thought was extra purulence in the tip of the finger so he used tweezers to pull however realized that that felt like bone and stopped.He has mild pain. No fever or chills. Patient denies chest pain, shortness breath, cough.No nausea vomiting, diarrhea, abdominal pain, symptoms of UTI or unilateral weakness.  Labs in the ED notable for WBC 10.6, lactic acid of 2.5, AKI with creatinine 1.40, BUN 24 (creatinine 1.07 on 10/09/2018).  Vitals stable.  Orthopedic surgeon, Dr. Grandville Silos was consulted.  Patient admitted to med/surg bed.  MRI of left hand obtained and showed intense cellulitis, osteomyelitis and septic tenosynovitis.  He is received IV antibiotics.  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 Grandville Silos.  He has tolerated the procedure well. Infectious disease has been consulted to determine duration and choice of antibiotic coverage.  Consultants  . Orthopedic surgery. . Infectious disease.  Procedures  . Amputation of the second digit of the left hand. Marland Kitchen PICC  placement - pending.  Antibiotics   Anti-infectives (From admission, onward)   Start     Dose/Rate Route Frequency Ordered Stop   01/02/19 1800  Ampicillin-Sulbactam (UNASYN) 3 g in sodium chloride 0.9 % 100 mL IVPB  Status:  Discontinued     3 g 200 mL/hr over 30 Minutes Intravenous Every 12 hours 01/02/19 1010 01/02/19 1102   01/02/19 1800  metroNIDAZOLE (FLAGYL) tablet 500 mg     500 mg Oral Every 6 hours 01/02/19 1458     01/02/19 1700  Ampicillin-Sulbactam (UNASYN) 3 g in sodium chloride 0.9 % 100 mL IVPB  Status:  Discontinued     3 g 200 mL/hr over 30 Minutes Intravenous Every 6 hours 01/02/19 1102 01/02/19 1458   01/02/19 1530  cefTRIAXone (ROCEPHIN) 2 g in sodium chloride 0.9 % 100 mL IVPB     2 g 200 mL/hr over 30 Minutes Intravenous Every 24 hours 01/02/19 1458     01/01/19 1800  piperacillin-tazobactam (ZOSYN) IVPB 3.375 g  Status:  Discontinued     3.375 g 12.5 mL/hr over 240 Minutes Intravenous Every 8 hours 01/01/19 1657 01/02/19 1010   12/31/18 2000  vancomycin (VANCOCIN) 2,000 mg in sodium chloride 0.9 % 500 mL IVPB  Status:  Discontinued     2,000 mg 250 mL/hr over 120 Minutes Intravenous Every 24 hours 12/30/18 2035 01/02/19 1454   12/30/18 1930  vancomycin (VANCOCIN) 2,500 mg in sodium chloride 0.9 % 500 mL IVPB     2,500 mg 250 mL/hr over 120 Minutes Intravenous  Once 12/30/18 1901 12/30/18 2255   12/30/18 1930  piperacillin-tazobactam (ZOSYN) IVPB  3.375 g     3.375 g 100 mL/hr over 30 Minutes Intravenous  Once 12/30/18 1901 12/30/18 2006   12/30/18 1830  vancomycin (VANCOCIN) IVPB 1000 mg/200 mL premix  Status:  Discontinued     1,000 mg 200 mL/hr over 60 Minutes Intravenous  Once 12/30/18 1827 12/30/18 1827   12/30/18 1830  cefTRIAXone (ROCEPHIN) 2 g in sodium chloride 0.9 % 100 mL IVPB  Status:  Discontinued     2 g 200 mL/hr over 30 Minutes Intravenous  Once 12/30/18 1827 12/30/18 1827     Subjective  The patient is sitting up at the edge of the bed  attending to personal business. He is in no acute distress.  Objective   Vitals:  Vitals:   01/02/19 0432 01/02/19 0936  BP: (!) 146/48 (!) 156/56  Pulse: 64 72  Resp: (!) 21 16  Temp: 98.6 F (37 C)   SpO2: 95% 98%   Exam:  Constitutional:  . The patient is awake, alert, and oriented x 3. No acute distress. Respiratory:  . No increased work of breathing. . No wheezes, rales, or rhonchi . No tactile fremitus Cardiovascular:  . Regular rate and rhythm . No murmurs, ectopy, or gallups. . No lateral PMI. No thrills. Abdomen:  . Abdomen is soft, non-tender, non-distended . No hernias, masses, or organomegaly . Normoactive bowel sounds.  Musculoskeletal:  . No cyanosis, clubbing, or edema . Left hand is bandaged. Bandages are clean and dry. Skin:  . No rashes, lesions, ulcers . palpation of skin: no induration or nodules Neurologic:  . CN 2-12 intact . Sensation all 4 extremities intact Psychiatric:  . Mental status o Mood, affect appropriate o Orientation to person, place, time  . judgment and insight appear intact  I have personally reviewed the following:   Today's Data  . Vitals, Glucoses  Micro Data  . Aerobic/anaerobic cultures from left finger: Eikenella corrodens and Staph aureus.   Scheduled Meds: . amLODipine  10 mg Oral Daily  . aspirin EC  81 mg Oral Daily  . atorvastatin  20 mg Oral Daily  . insulin aspart  0-9 Units Subcutaneous TID WC  . insulin glargine  20 Units Subcutaneous QHS  . levothyroxine  137 mcg Oral QAC breakfast  . metroNIDAZOLE  500 mg Oral Q6H  . PARoxetine  20 mg Oral BH-q7a  . pramipexole  1 mg Oral QHS  . sodium chloride flush  3 mL Intravenous Once  . vitamin B-12  1,000 mcg Oral Daily   Continuous Infusions: . cefTRIAXone (ROCEPHIN)  IV      Principal Problem:   Finger infection-Left index finger Active Problems:   Hypothyroidism   Hypertension   Hyperlipidemia   Diabetes mellitus without complication (HCC)    Depression   AKI (acute kidney injury) ([place])   Lactic acidosis   OSA on CPAP   RLS (restless legs syndrome)   Osteomyelitis of finger (HCC)   MSSA (methicillin susceptible Staphylococcus aureus) infection   LOS: 3 days   A & P   Cellulitis of the Left 2nd digit with Osteomyelitis and Septic tenosynovitis of same: The patient is seen after amputation of digit by Dr. Grandville Silos. He has tolerated the procedure well. He will be continued on Zosyn for now as cultures have grown out Eikenella and Staph aureus. Will consult ID for antibiotic recs on d/c. Follow ESR and CRP. Wound care consult. I greatly appreciate infectious disease's assistance. Recommendation is for 6 weeks of 2 gm Iv  rocephin daily and Flagyl 500 mg tid. PICC is being placed.  Hypertension: Hold HCTZand Cozaar due to AKI. Home amlodipine has been increased from 5 mg to 10 mg daily with oral hydralazine PRN.  Hypothyroidism: Continue home dose of Synthroid.  Hyperlipidemia: Continue home Lipitor.  Diabetes mellitus without complication ([place]): J4H 6.9. Home regimen is Humalog, Actos, metformin, glipizide, Lantus, Ozempic.Blood sugars have run 197 - 298 in the last 24 hours.  Lantus dose decreased from50 to 20 unit daily due to patient's NPO status overnight. Will increase back towards baseline. He is also being covered by SSI.  Depression:No SI or HI. Continue home Paxil.  AKI (acute kidney injury) (HCC):Creatinine was 1.07 on 10/09/2018. His creatinine is 1.40, BUN 24. Likely due to dehydration and continuation of HCTZ and Cozaar. Avoid nephrotoxic medications and hypotension. Monitor creatinine, electrolytes, and volume status.  Lactic acidosis:Lactic acid 2.5 on admission. Resolved.   OSA: on CPAP  RLS (restless legs syndrome): Continue home Pramipexole  I have seen and examined this patient myself. I have spent 32 minutes in his evaluation and care.  DVT prophylaxis: SCD's Code Status:   Code Status:  Full Code  Family Communication: no family available Disposition Plan: Home with home health for IV antibiotics pending ortho clearance. Expect 1-2 days  Talonda Artist, DO [facility] Direct contact: see [email or web address]  7PM-7AM contact night coverage as above 01/02/2019, 3:16 PM  LOS: 2 days

## 2019-01-02 NOTE — Progress Notes (Signed)
Pt states he will self administer CPAP when he is ready for bed. 

## 2019-01-02 NOTE — Progress Notes (Signed)
      INFECTIOUS DISEASE ATTENDING ADDENDUM:   Date: 01/02/2019  Patient name: Ronald Heath  Medical record number: [id number]  Date of birth: [birth year]   I have decided to consolidate his rx to Ceftriaxone and oral flagyl  Diagnosis: Osteomyelitis of finger  Culture Result: Methicillin sensitive Staphylococcus aureus, Eikenella corrodens and possible other anaerobes  No Known Allergies  OPAT Orders Discharge antibiotics:   Ceftriaxone 2 g IV daily along with oral metronidazole 500 mg 3 times daily   Duration:  6 weeks  End Date:  02/11/2019  [facility] Care Per Protocol:   Labs  weekly while on IV antibiotics: _x_ CBC with differential _x_ BMP w GFR/CMP _x_ CRP _x_ ESR   _x_ Please pull PIC at completion of IV antibiotics __ Please leave PIC in place until doctor has seen patient or been notified  Fax weekly labs to [phone]  Clinic Follow Up Appt:   Ronald Heath has an appointment on 01/27/2019 with Dr. Tommy Medal at [facility] is located in the [facility] at  [address] in [place].  [address], which is located to the left of the elevators.  Phone: [phone]  Fax: [phone]  [email or web address]  He should plan arriving 15 minutes prior to his appointment.  We will otherwise sign off for now please call with further questions.    Rhina Brackett Dam 01/02/2019, 3:01 PM

## 2019-01-02 NOTE — Plan of Care (Signed)
  Problem: Education: Goal: Knowledge of General Education information will improve Description: Including pain rating scale, medication(s)/side effects and non-pharmacologic comfort measures Outcome: Progressing   Problem: Health Behavior/Discharge Planning: Goal: Ability to manage health-related needs will improve Outcome: Progressing   Problem: Clinical Measurements: Goal: Respiratory complications will improve Outcome: Progressing Goal: Cardiovascular complication will be avoided Outcome: Progressing   Problem: Activity: Goal: Risk for activity intolerance will decrease Outcome: Progressing   Problem: Nutrition: Goal: Adequate nutrition will be maintained Outcome: Progressing   Problem: Coping: Goal: Level of anxiety will decrease Outcome: Progressing   

## 2019-01-02 NOTE — Consult Note (Signed)
[facility]    Date of Admission:  12/30/2018          Reason for Consult: L index finger osteomyelitis s/p amputation     Referring Provider: Karie Kirks, DO Primary Care Provider: Sinda Du, MD  Assessment:  Ronald Heath is a 73 y.o. male with significant PMH of diabetes mellitus, HTN, hyperlipidemia, hypothyroidism, depression, and OSA who presented for a left index finger infection. 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   Pt has remained afebrile. WBC 7.2. Blood culture from 10/12 with no growth to date. Left finger wound culture growing Staph aureus and Eikenella corrodens on culture. The staph is sensitive to all antibiotics tested.   Would narrow antibiotic while inpatient to Unasyn. Pt states he lives alone at home and could have his son help with IV therapy at home, though seemed apprehensive to this idea especially with q6h Unasyn dosing. He may been a good candidate to finish IV antibiotics at a nursing facility before going home.  Plan: 1. Discontinue Zosyn 2. Start Unasyn   Principal Problem:   Finger infection-Left index finger Active Problems:   Hypothyroidism   Hypertension   Hyperlipidemia   Diabetes mellitus without complication (HCC)   Depression   AKI (acute kidney injury) ([place])   Lactic acidosis   OSA on CPAP   RLS (restless legs syndrome)   Osteomyelitis ([place])   . amLODipine  10 mg Oral Daily  . aspirin EC  81 mg Oral Daily  . atorvastatin  20 mg Oral Daily  . insulin aspart  0-9 Units Subcutaneous TID WC  . insulin glargine  20 Units Subcutaneous QHS  . levothyroxine  137 mcg Oral QAC breakfast  . PARoxetine  20 mg Oral BH-q7a  . pramipexole  1 mg Oral QHS  . sodium chloride flush  3 mL Intravenous Once  . vitamin B-12  1,000 mcg Oral Daily    HPI: Ronald Heath is a 73 y.o. male with significant PMH of diabetes mellitus, HTN, hyperlipidemia,  hypothyroidism, depression, and OSA who presented for a left index finger infection. He sustained an injury 8 months ago where he cut off the tips of his index and long finger with a butcher knife. Pt states he did not seek medical care at that time, and had treated it with peroxide and neosporin at home. Over the past month, the wound was worsened and was swollen, red, and draining purulent material. He states that he had been trying to squeeze out the drainage at home and clean off the site with tweezers and putting the wound in his mouth. He denied fevers, chills, or systemic symptoms at home. In the ED, his labs were significant for WBC 10.6, lactic acid of 2.5, Cr 1.40, and BUN 24. X-ray of the L hand remarkable for soft tissue swelling and signs of open fracture and/or osteomyelitis of the distal phalanx. He was started on empiric antibiotics of vancomycin and zosyn.   MRI of the L hand revealed intense cellulitis of the index finger with abscess in the volvar soft tissues tracking from PIP to second MCP joint, osteomyelitis throughout the middle and distal phalanges of the index finger, pathologic transverse fracture through the proximal metaphysis of the distal phalanx, and septic tenosynovitis with a complete tear of the flexor digitorum profundus tendon.   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 Intraoperative wound culture growing Staph aureus and Eikenella corrodens on culture. The staph is sensitive to all antibiotics tested. Surgical pathology still pending. ID consulted for antibiotic recommendations on discharge.   Review of Systems: Review of Systems  Constitutional: Negative for chills and fever.  Respiratory: Negative for shortness of breath.   Cardiovascular: Negative for chest pain.  Gastrointestinal: Negative for abdominal pain, nausea and vomiting.  Musculoskeletal: Negative for back pain, joint pain and neck pain.   Skin: Negative for rash.   Past Medical History:  Diagnosis Date  . Depression   . Diabetes mellitus without complication ([place])   . Finger infection 12/31/2018  . Hyperlipidemia   . Hypertension   . Hypothyroidism   . Shortness of breath    with exertion  . Sleep apnea    pt is supposed to wear CPAP, but doesn't wear it    Social History   Tobacco Use  . Smoking status: Former Smoker    Packs/day: 2.00    Years: 15.00    Pack years: 30.00    Types: Cigarettes  . Smokeless tobacco: Never Used  Substance Use Topics  . Alcohol use: Yes    Alcohol/week: 2.0 standard drinks    Types: 2 Cans of beer per week  . Drug use: No    Family History  Problem Relation Age of Onset  . Diabetes Mellitus II Father   . Stroke Father    No Known Allergies  OBJECTIVE: Blood pressure (!) 156/56, pulse 72, temperature 98.6 F (37 C), temperature source Oral, resp. rate 16, height 6\' 1"  (1.854 m), weight (!) 187 kg, SpO2 98 %.  Physical Exam Vitals signs and nursing note reviewed.  Constitutional:      General: He is not in acute distress.    Appearance: He is obese. He is not ill-appearing.  Cardiovascular:     Rate and Rhythm: Normal rate and regular rhythm.     Heart sounds: Normal heart sounds.  Pulmonary:     Effort: Pulmonary effort is normal. No respiratory distress.     Breath sounds: Normal breath sounds.  Abdominal:     General: Abdomen is flat.     Palpations: Abdomen is soft.     Comments: Distended secondary to body habitus  Musculoskeletal:     Comments: L hand wrapped in bandage s/p index finger amputation to the [facility] joint.  Skin:    General: Skin is warm and dry.  Neurological:     Mental Status: He is alert.  Psychiatric:        Mood and Affect: Mood normal.    Lab Results Lab Results  Component Value Date   WBC 7.2 01/01/2019   HGB 10.7 (L) 01/01/2019   HCT 32.8 (L) 01/01/2019   MCV 86.8 01/01/2019   PLT 274 01/01/2019    Lab Results  Component  Value Date   CREATININE 1.09 01/01/2019   BUN 16 01/01/2019   NA 137 01/01/2019   K 4.2 01/01/2019   CL 104 01/01/2019   CO2 25 01/01/2019    Lab Results  Component Value Date   ALT 13 12/30/2018   AST 18 12/30/2018   ALKPHOS 76 12/30/2018   BILITOT 0.5 12/30/2018     Microbiology: Recent Results (from the past 240 hour(s))  SARS Coronavirus 2 by RT PCR (hospital order, performed in [facility] hospital lab) Nasopharyngeal Nasopharyngeal Swab  Status: None   Collection Time: 12/30/18  4:22 PM   Specimen: Nasopharyngeal Swab  Result Value Ref Range Status   SARS Coronavirus 2 NEGATIVE NEGATIVE Final    Comment: (NOTE) If result is NEGATIVE SARS-CoV-2 target nucleic acids are NOT DETECTED. The SARS-CoV-2 RNA is generally detectable in upper and lower  respiratory specimens during the acute phase of infection. The lowest  concentration of SARS-CoV-2 viral copies this assay can detect is 250  copies / mL. A negative result does not preclude SARS-CoV-2 infection  and should not be used as the sole basis for treatment or other  patient management decisions.  A negative result may occur with  improper specimen collection / handling, submission of specimen other  than nasopharyngeal swab, presence of viral mutation(s) within the  areas targeted by this assay, and inadequate number of viral copies  (<250 copies / mL). A negative result must be combined with clinical  observations, patient history, and epidemiological information. If result is POSITIVE SARS-CoV-2 target nucleic acids are DETECTED. The SARS-CoV-2 RNA is generally detectable in upper and lower  respiratory specimens dur ing the acute phase of infection.  Positive  results are indicative of active infection with SARS-CoV-2.  Clinical  correlation with patient history and other diagnostic information is  necessary to determine patient infection status.  Positive results do  not rule out bacterial infection or  co-infection with other viruses. If result is PRESUMPTIVE POSTIVE SARS-CoV-2 nucleic acids MAY BE PRESENT.   A presumptive positive result was obtained on the submitted specimen  and confirmed on repeat testing.  While 2019 novel coronavirus  (SARS-CoV-2) nucleic acids may be present in the submitted sample  additional confirmatory testing may be necessary for epidemiological  and / or clinical management purposes  to differentiate between  SARS-CoV-2 and other Sarbecovirus currently known to infect humans.  If clinically indicated additional testing with an alternate test  methodology [phone]) is advised. The SARS-CoV-2 RNA is generally  detectable in upper and lower respiratory sp ecimens during the acute  phase of infection. The expected result is Negative. Fact Sheet for Patients:  StrictlyIdeas.no Fact Sheet for Healthcare Providers: BankingDealers.co.za This test is not yet approved or cleared by the [place] FDA and has been authorized for detection and/or diagnosis of SARS-CoV-2 by FDA under an Emergency Use Authorization (EUA).  This EUA will remain in effect (meaning this test can be used) for the duration of the COVID-19 declaration under Section 564(b)(1) of the Act, 21 U.S.C. section 360bbb-3(b)(1), unless the authorization is terminated or revoked sooner. Performed at [facility], [place] [address], [place], [place] [address]   Culture, blood (routine x 2)     Status: None (Preliminary result)   Collection Time: 12/30/18  5:10 PM   Specimen: BLOOD  Result Value Ref Range Status   Specimen Description BLOOD RIGHT ANTECUBITAL  Final   Special Requests   Final    BOTTLES DRAWN AEROBIC AND ANAEROBIC Blood Culture results may not be optimal due to an inadequate volume of blood received in culture bottles   Culture   Final    NO GROWTH 3 DAYS Performed at [facility], [place] [address] [place], [place] [address]     Report Status PENDING  Incomplete  Culture, blood (routine x 2)     Status: None (Preliminary result)   Collection Time: 12/30/18  5:10 PM   Specimen: BLOOD  Result Value Ref Range Status   Specimen Description BLOOD  LEFT ANTECUBITAL  Final   Special Requests   Final    AEROBIC BOTTLE ONLY Blood Culture results may not be optimal due to an inadequate volume of blood received in culture bottles   Culture   Final    NO GROWTH 3 DAYS Performed at [facility], [place] [address] [place], [place] [address]    Report Status PENDING  Incomplete  Aerobic/Anaerobic Culture (surgical/deep wound)     Status: None (Preliminary result)   Collection Time: 12/30/18  5:25 PM   Specimen: Wound; Abscess  Result Value Ref Range Status   Specimen Description WOUND LEFT FINGER  Final   Special Requests NONE  Final   Gram Stain   Final    RARE WBC PRESENT, PREDOMINANTLY PMN RARE GRAM POSITIVE COCCI    Culture   Final    FEW STAPHYLOCOCCUS AUREUS MODERATE EIKENELLA CORRODENS Usually susceptible to penicillin and other beta lactam agents,quinolones,macrolides and tetracyclines. HOLDING FOR POSSIBLE ANAEROBE    Report Status PENDING  Incomplete   Organism ID, Bacteria STAPHYLOCOCCUS AUREUS  Final      Susceptibility   Staphylococcus aureus - MIC*    CIPROFLOXACIN <=0.5 SENSITIVE Sensitive     ERYTHROMYCIN <=0.25 SENSITIVE Sensitive     GENTAMICIN <=0.5 SENSITIVE Sensitive     OXACILLIN 0.5 SENSITIVE Sensitive     TETRACYCLINE <=1 SENSITIVE Sensitive     VANCOMYCIN 1 SENSITIVE Sensitive     TRIMETH/SULFA <=10 SENSITIVE Sensitive     CLINDAMYCIN <=0.25 SENSITIVE Sensitive     RIFAMPIN <=0.5 SENSITIVE Sensitive     Inducible Clindamycin Value in next row Sensitive      NEGATIVEPerformed at [facility] [address] [place], [place] [address]    * FEW STAPHYLOCOCCUS AUREUS  MRSA PCR Screening     Status: None   Collection Time: 12/31/18  2:30 AM   Specimen: Nasal Mucosa;  Nasopharyngeal  Result Value Ref Range Status   MRSA by PCR NEGATIVE NEGATIVE Final    Comment:        The GeneXpert MRSA Assay (FDA approved for NASAL specimens only), is one component of a comprehensive MRSA colonization surveillance program. It is not intended to diagnose MRSA infection nor to guide or monitor treatment for MRSA infections. Performed at [facility], [place] [address], [place], [place] [address]   Aerobic/Anaerobic Culture (surgical/deep wound)     Status: None (Preliminary result)   Collection Time: 01/01/19  8:48 AM   Specimen: Soft Tissue, Other  Result Value Ref Range Status   Specimen Description WOUND HAND LEFT  Final   Special Requests INDEX FINGER/PT ON VANC ANCEF  Final   Gram Stain   Final    RARE WBC PRESENT, PREDOMINANTLY PMN RARE GRAM POSITIVE COCCI IN PAIRS FEW GRAM NEGATIVE RODS Performed at [facility], [place] [address], [place], [place] [address]    Culture PENDING  Incomplete   Report Status PENDING  Incomplete    Ladona Horns, MD Internal Medicine Resident Intern Pager: [phone]  01/02/2019 11:24 AM

## 2019-01-02 NOTE — Progress Notes (Signed)
S/p L IF amputation through MPJ 01-01-19  Sitting up, reasonably comfortable. No significant drainage, remaining tissues at base of digit still swollen and reddened. No expressible purulence  Surgical Recommendations:  1. I will remain involved with patient and take responsibility for seeing that the wound closes, sutures are removed at appropriate time as outpatient, and determine/oversee any need for therapy as outpatient for functional recovery. Patient should follow-up in 2 weeks with me as outpatient to address these issues. May stop dressings once no more drainage.  Please feel free to reach out as needed while patient is inpatient if additional procedural services are sought.  I will be physically away (but largely reachable) Friday through Monday.  Patient may d/c at any point from surgical POV.  2. ID now involved in antimicrobial management for inpatient needs and outpatient resolution.  Micheline Rough, MD Hand Surgery Mobile [phone]

## 2019-01-02 NOTE — Progress Notes (Addendum)
PHARMACY CONSULT NOTE FOR:  OUTPATIENT  PARENTERAL ANTIBIOTIC THERAPY (OPAT)  Indication: Osteomyelitis  Regimen: Ceftriaxone 2g IV q24h  End date: 02/11/2019   Patient will also be on oral Flagyl at discharge until 11/24 as well.   IV antibiotic discharge orders are pended. To discharging provider:  please sign these orders via discharge navigator,  Select New Orders & click on the button choice - Manage This Unsigned Work.     Thank you for allowing pharmacy to be a part of this patient's care.  Phillis Haggis 01/02/2019, 3:03 PM

## 2019-01-03 LAB — CBC WITH DIFFERENTIAL/PLATELET
Abs Immature Granulocytes: 0.02 10*3/uL (ref 0.00–0.07)
Basophils Absolute: 0 10*3/uL (ref 0.0–0.1)
Basophils Relative: 0 %
Eosinophils Absolute: 0.3 10*3/uL (ref 0.0–0.5)
Eosinophils Relative: 5 %
HCT: 31.4 % — ABNORMAL LOW (ref 39.0–52.0)
Hemoglobin: 10 g/dL — ABNORMAL LOW (ref 13.0–17.0)
Immature Granulocytes: 0 %
Lymphocytes Relative: 14 %
Lymphs Abs: 1 10*3/uL (ref 0.7–4.0)
MCH: 28.2 pg (ref 26.0–34.0)
MCHC: 31.8 g/dL (ref 30.0–36.0)
MCV: 88.7 fL (ref 80.0–100.0)
Monocytes Absolute: 0.6 10*3/uL (ref 0.1–1.0)
Monocytes Relative: 8 %
Neutro Abs: 5 10*3/uL (ref 1.7–7.7)
Neutrophils Relative %: 73 %
Platelets: 277 10*3/uL (ref 150–400)
RBC: 3.54 MIL/uL — ABNORMAL LOW (ref 4.22–5.81)
RDW: 13.2 % (ref 11.5–15.5)
WBC: 6.9 10*3/uL (ref 4.0–10.5)
nRBC: 0 % (ref 0.0–0.2)

## 2019-01-03 LAB — BASIC METABOLIC PANEL
Anion gap: 9 (ref 5–15)
BUN: 13 mg/dL (ref 8–23)
CO2: 24 mmol/L (ref 22–32)
Calcium: 8 mg/dL — ABNORMAL LOW (ref 8.9–10.3)
Chloride: 103 mmol/L (ref 98–111)
Creatinine, Ser: 1.15 mg/dL (ref 0.61–1.24)
GFR calc Af Amer: >60 mL/min (ref >60)
GFR calc non Af Amer: >60 mL/min (ref >60)
Glucose, Bld: 218 mg/dL — ABNORMAL HIGH (ref 70–99)
Potassium: 4.2 mmol/L (ref 3.5–5.1)
Sodium: 136 mmol/L (ref 135–145)

## 2019-01-03 LAB — GLUCOSE, CAPILLARY
Glucose-Capillary: 177 mg/dL — ABNORMAL HIGH (ref 70–99)
Glucose-Capillary: 175 mg/dL — ABNORMAL HIGH (ref 70–99)
Glucose-Capillary: 173 mg/dL — ABNORMAL HIGH (ref 70–99)
Glucose-Capillary: 261 mg/dL — ABNORMAL HIGH (ref 70–99)

## 2019-01-03 MED ORDER — CHLORHEXIDINE GLUCONATE CLOTH 2 % EX PADS
6.0000 | MEDICATED_PAD | Freq: Every day | CUTANEOUS | Status: DC
  Administered 2019-01-03: 6 via TOPICAL

## 2019-01-03 MED ORDER — METRONIDAZOLE 500 MG PO TABS: 500.0000 mg | ORAL_TABLET | Freq: Three times a day (TID) | ORAL | 0 refills | Status: AC

## 2019-01-03 MED ORDER — CEFTRIAXONE IV (FOR PTA / DISCHARGE USE ONLY): 2.0000 g | INTRAVENOUS | 0 refills | Status: AC

## 2019-01-03 MED ORDER — HEPARIN SOD (PORK) LOCK FLUSH 100 UNIT/ML IV SOLN
250.0000 [IU] | INTRAVENOUS | Status: AC | PRN
  Administered 2019-01-03: 17:00:00 250 [IU]
  Filled 2019-01-03: qty 2.5

## 2019-01-03 MED ORDER — SODIUM CHLORIDE 0.9% FLUSH
10.0000 mL | INTRAVENOUS | Status: DC | PRN
  Administered 2019-01-03: 17:00:00 10 mL
  Filled 2019-01-03: qty 40

## 2019-01-03 MED ORDER — HYDRALAZINE HCL 25 MG PO TABS: 25.0000 mg | ORAL_TABLET | Freq: Three times a day (TID) | ORAL | 0 refills | Status: DC

## 2019-01-03 MED FILL — metroNIDAZOLE 500 MG TABS: 500 | 40 days supply | Qty: 120 | Fill #0

## 2019-01-03 MED FILL — hydrALAZINE HCL 25 MG TABS: 25 | 30 days supply | Qty: 90 | Fill #0

## 2019-01-03 NOTE — Progress Notes (Signed)
Peripherally Inserted Central Catheter/Midline Placement  The IV Nurse has discussed with the patient and/or persons authorized to consent for the patient, the purpose of this procedure and the potential benefits and risks involved with this procedure.  The benefits include less needle sticks, lab draws from the catheter, and the patient may be discharged home with the catheter. Risks include, but not limited to, infection, bleeding, blood clot (thrombus formation), and puncture of an artery; nerve damage and irregular heartbeat and possibility to perform a PICC exchange if needed/ordered by physician.  Alternatives to this procedure were also discussed.  Bard Power PICC patient education guide, fact sheet on infection prevention and patient information card has been provided to patient /or left at bedside.    PICC/Midline Placement Documentation  PICC Single Lumen [id number] PICC Right Basilic 51 cm (Active)  Indication for Insertion or Continuance of Line Home intravenous therapies (PICC only) 01/03/19 0900  Site Assessment Clean;Dry;Intact 01/03/19 0900  Line Status Flushed;Blood return noted 01/03/19 0900  Dressing Type Transparent 01/03/19 0900  Dressing Status Clean;Dry;Intact;Antimicrobial disc in place 01/03/19 0900  Dressing Change Due 01/10/19 01/03/19 0900       Jule Economy Horton 01/03/2019, 9:16 AM

## 2019-01-03 NOTE — TOC Transition Note (Addendum)
Transition of Care [facility]) - CM/SW Discharge Note   Patient Details  Name: Ronald Heath MRN: [id number] Date of Birth: [birth year]  Transition of [facility]) CM/SW Contact:  Midge Minium RN, BSN, NCM-BC, ACM-RN [phone] Phone Number: 01/03/2019, 3:00 PM   Clinical Narrative:    CM following for dispositional needs. CM spoke to the patient to discuss the POC and DC needs. Patient states he lives at home and was independent PTA; has a FWW to assist during ambulation. PCP/Demo verified. Patient is S/p L IF amputation through MPJ 01-01-19 with home infusion/HHRN recommended post-discharge. CMS [facility] Compare list provided with no preference. Home infusion will be arranged with [facility]. CM contacted: Radene Knee, [facility], [facility], [facility], [facility] with the [facility] referral denied d/t staffing and/or location. CM contacted [facility] to arrange outpatient PICC care/labs, with the scheduler gone for the day. Patient states he is teachable and will have assistance, as needed, by his son with the home infusion; also agreeable to outpatient services.   Addendum: 01/03/19 @ 1554-Ronald Heath RNCM- Patient will follow at [facility] for his PICC care/labs once the appointment is arranged; AVS updated. CM left a message with Kyung Bacca ([facility]) scheduler and will f/u on Monday and contact the patient once the appointment is arranged. CM updated Dr. Benny Lennert who is agreeable to the patient discharging today with CM to f/u on Monday. Patient is agreeable to the arrangements and verbalized understanding.    Final next level of care: [place] Barriers to Discharge: No Barriers Identified   Patient Goals and CMS Choice Patient states their goals for this hospitalization and ongoing recovery are:: "to get stronger" CMS Medicare.gov Compare Post Acute Care list provided to:: Patient Choice offered to / list presented to : Patient   Discharge Plan and  Services                DME Arranged: IV pump/equipment DME Agency: Other - Comment Date DME Agency Contacted: 01/03/19 Time DME Agency Contacted: 1200 Representative spoke with at DME Agency: Carolynn Sayers ([facility]) HH Arranged: RN, Disease Management [place] Agency: Tour manager        Social Determinants of Health (SDOH) Interventions     Readmission Risk Interventions No flowsheet data found.

## 2019-01-03 NOTE — Plan of Care (Addendum)
Pt and son provided with dressing change supplies and discharge teaching. Both taught how to do dressing changes and when to follow up with providers. Pt verbalized understanding. PICC line clean, dry, intact upon discharge.   Problem: Education: Goal: Knowledge of General Education information will improve Description: Including pain rating scale, medication(s)/side effects and non-pharmacologic comfort measures 01/03/2019 1637 by Stevan Born, RN Outcome: Completed/Met 01/03/2019 1122 by Stevan Born, RN Outcome: Progressing   Problem: Health Behavior/Discharge Planning: Goal: Ability to manage health-related needs will improve Outcome: Completed/Met   Problem: Clinical Measurements: Goal: Ability to maintain clinical measurements within normal limits will improve Outcome: Completed/Met Goal: Will remain free from infection 01/03/2019 1637 by Stevan Born, RN Outcome: Completed/Met 01/03/2019 1122 by Stevan Born, RN Outcome: Progressing Goal: Diagnostic test results will improve Outcome: Completed/Met Goal: Respiratory complications will improve 01/03/2019 1637 by Stevan Born, RN Outcome: Completed/Met 01/03/2019 1122 by Stevan Born, RN Outcome: Progressing Goal: Cardiovascular complication will be avoided Outcome: Completed/Met   Problem: Activity: Goal: Risk for activity intolerance will decrease 01/03/2019 1637 by Stevan Born, RN Outcome: Completed/Met 01/03/2019 1122 by Stevan Born, RN Outcome: Progressing   Problem: Nutrition: Goal: Adequate nutrition will be maintained Outcome: Completed/Met   Problem: Coping: Goal: Level of anxiety will decrease Outcome: Completed/Met   Problem: Elimination: Goal: Will not experience complications related to bowel motility Outcome: Completed/Met Goal: Will not experience complications related to urinary retention Outcome: Completed/Met   Problem: Pain Managment: Goal: General  experience of comfort will improve 01/03/2019 1637 by Stevan Born, RN Outcome: Completed/Met 01/03/2019 1122 by Stevan Born, RN Outcome: Progressing   Problem: Safety: Goal: Ability to remain free from injury will improve 01/03/2019 1637 by Stevan Born, RN Outcome: Completed/Met 01/03/2019 1122 by Stevan Born, RN Outcome: Progressing   Problem: Skin Integrity: Goal: Risk for impaired skin integrity will decrease 01/03/2019 1637 by Stevan Born, RN Outcome: Completed/Met 01/03/2019 1122 by Stevan Born, RN Outcome: Progressing   Jenene Slicker, RN

## 2019-01-03 NOTE — Discharge Summary (Signed)
Physician Discharge Summary  Ronald Heath:[id number] DOB: 04-19-1945 DOA: 12/30/2018  PCP: Sinda Du, MD  Admit date: 12/30/2018 Discharge date: 01/03/2019  Recommendations for Outpatient Follow-up:  1. Home with home health. 2. Complete antibiotics on 02/11/2019. PICC to be pulled at completion of antibiotics. 3. Follow up with orthopedic surgery as directed.  4. Follow up with PCP in 7-10 days.  5. BMP to be drawn in one week  Follow-up Information    Schedule an appointment as soon as possible for a visit with Ronald Jakob, MD.   Specialty: Orthopedic Surgery Why: for approx 2 weeks following surgery  Contact information: [address] [place] [address] [phone]        [facility] Follow up.   Why: home infusion Contact information:   [address] [place]  [place]  [place], [place] [address] Phone:  [phone]       [facility] Follow up.   Why: Short Stay for your outpatient PICC care and labs Contact information: [address] [phone]           Discharge Diagnoses: Principal diagnosis is #1 1. Cellulitis of the left second digit of the hand with osteomyelitis and septic tenosynovitis of same. 2. Hypertension 3. Hypothyroidism 4. Hyperlipidemia 5. DM II 6. Depression 7. AKI 8. Lactic Acidosis 9. OSA 10. RLS  Discharge Condition: Fair Disposition: Home with Home Health  Diet recommendation: Heart healthy with carbohydrates modified.  Filed Weights   01/01/19 1300  Weight: (!) 187 kg    History of present illness:  Ronald Heath is a 73 y.o. male with medical history significant of hypertension, hyperlipidemia, diabetes mellitus, hypothyroidism, depression, OSA on CPAP, RLS, who presents with left index finger infection.  Patient states that he injured her left fingertip 8 months ago. He reports that about 8  months ago he accidentally cut off the tips of his left pointer and long finger with a butcher knife. He did not seek for medical care at that time. He states that over the past 8 months he has been treating it with peroxide and Neosporin. The wound has never healed. It has been worsening progressively over the past month. About 2 weeks ago he noticed that it had swollen to the point that it started draining purulent material. He states that 1 week ago he was pressing on it to get purulent drainage out and saw what he thought was extra purulence in the tip of the finger so he used tweezers to pull however realized that that felt like bone and stopped.  He has mild pain.  No fever or chills.  Patient denies chest pain, shortness breath, cough.  No nausea vomiting, diarrhea, abdominal pain, symptoms of UTI or unilateral weakness.  ED Course: pt was found to have WBC 10.6, lactic acid of 2.5, negative COVID-19 test, AKI with creatinine 1.40, BUN 24 (creatinine 1.07 on 10/09/2018), temperature normal, blood pressure 152/72, heart rate 65, oxygen saturation 96% on room air. Pending MRI-left hand. Pt is admitted to [facility] bed as inpatient.  Orthopedic surgeon, Dr. Grandville Silos was consulted.  X-ray of left hand showed: Marked soft tissue swelling particularly about the index finger with signs of open fracture and/or osteomyelitis of the distal phalanx. No radiopaque foreign body.  Hospital Course:  73 y.o.malewith medical history significant ofhypertension, hyperlipidemia, diabetes mellitus, hypothyroidism, depression, OSA on CPAP, RLS, who presents with left index finger infection.  Patient states that he injured her left fingertip 8 months ago when heaccidentally cut off the tips of his left pointer and long finger with a butcher knife. He did not seek formedical care at that time. He states that over the past 8 months he has been treating it with peroxide and Neosporin.The wound has never healed. It  has been worseningprogressively over the past month. About 2 weeks ago he noticed that it had swollen to the point that it started draining purulent material. He states that 1 week ago he was pressing on it to get purulent drainage out and saw what he thought was extra purulence in the tip of the finger so he used tweezers to pull however realized that that felt like bone and stopped.He has mild pain. No fever or chills. Patient denies chest pain, shortness breath, cough.No nausea vomiting, diarrhea, abdominal pain, symptoms of UTI or unilateral weakness.Labs in the ED notable forWBC 10.6, lactic acid of 2.5, AKI with creatinine 1.40, BUN 24 (creatinine 1.07 on 10/09/2018). Vitals stable. Orthopedic surgeon, Dr. Grandville Silos was consulted.Patient admitted to med/surg bed. MRI of left hand obtained and showed intense cellulitis, osteomyelitis and septic tenosynovitis. He is received IV antibiotics. 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 Grandville Silos.  He has tolerated the procedure well. Infectious disease has been consulted to determine duration and choice of antibiotic coverage.  Today's assessment: S: The patient is resting comfortably. No new complaints. O: Vitals:  Vitals:   01/03/19 0807 01/03/19 1553  BP: (!) 156/61 (!) 136/52  Pulse: 75 74  Resp: 16 16  Temp: 97.8 F (36.6 C) 98.6 F (37 C)  SpO2: 97% 97%   Constitutional:   The patient is awake, alert, and oriented x 3. No acute distress. Respiratory:   No increased work of breathing.  No wheezes, rales, or rhonchi  No tactile fremitus Cardiovascular:   Regular rate and rhythm  No murmurs, ectopy, or gallups.  No lateral PMI. No thrills. Abdomen:   Abdomen is soft, non-tender, non-distended  No hernias, masses, or organomegaly  Normoactive bowel sounds.  Musculoskeletal:   No cyanosis, clubbing, or edema  Left hand is bandaged. Bandages are clean  and dry. Skin:   No rashes, lesions, ulcers  palpation of skin: no induration or nodules Neurologic:   CN 2-12 intact  Sensation all 4 extremities intact Psychiatric:   Mental status ? Mood, affect appropriate ? Orientation to person, place, time   judgment and insight appear intact  Discharge Instructions  Discharge Instructions    Activity as tolerated - No restrictions   Complete by: As directed    Call MD for:  redness, tenderness, or signs of infection (pain, swelling, redness, odor or green/yellow discharge around incision site)   Complete by: As directed    Call MD for:  severe uncontrolled pain   Complete by: As directed    Call MD for:  temperature >100.4   Complete by: As directed    Diet - low sodium heart healthy   Complete by: As directed    Diet Carb Modified   Complete by: As directed    Discharge instructions   Complete by: As directed    Home with home health. Complete antibiotics on 02/11/2019. PICC to be pulled at completion of antibiotics. Follow up with orthopedic surgery as directed.  Follow up with PCP in 7-10 days.  BMP to be drawn in one week.   Home infusion instructions  [place] May follow [place] Dosing Protocol; May administer Cathflo as needed to maintain patency of vascular access device.; Flushing of vascular access device: per [facility] Protocol: 0.9% NaCl pre/post medica...   Complete by: As directed    Instructions: May follow [place] Dosing Protocol   Instructions: May administer Cathflo as needed to maintain patency of vascular access device.   Instructions: Flushing of vascular access device: per [facility] Protocol: 0.9% NaCl pre/post medication administration and prn patency; Heparin 100 u/ml, 24m for implanted ports and Heparin 10u/ml, 579mfor all other central venous catheters.   Instructions: May follow [facility] Anaphylaxis Protocol for First Dose Administration in the home: 0.9% NaCl at 25-50 ml/hr to maintain IV access for  protocol meds. Epinephrine 0.3 ml IV/IM PRN and Benadryl 25-50 IV/IM PRN s/s of anaphylaxis.   Instructions: [facility] Coordinator (RN) to assist per patient IV care needs in the home PRN.   Increase activity slowly   Complete by: As directed      Allergies as of 01/03/2019   No Known Allergies     Medication List    STOP taking these medications   hydrochlorothiazide 25 MG tablet Commonly known as: HYDRODIURIL   losartan 100 MG tablet Commonly known as: COZAAR   naproxen sodium 220 MG tablet Commonly known as: ALEVE   tobramycin 0.3 % ophthalmic solution Commonly known as: TOBREX     TAKE these medications   amLODipine 10 MG tablet Commonly known as: NORVASC Take 5 mg by mouth daily.   aspirin EC 81 MG tablet Take 81 mg by mouth daily.   atorvastatin 20 MG tablet Commonly known as: LIPITOR Take 20 mg by mouth daily.   cefTRIAXone  IVPB Commonly known as: ROCEPHIN Inject 2 g into the vein daily. Indication:  Osteomyelitis  Last Day of Therapy:  02/11/2019 Labs - Once weekly:  CBC/D and BMP, Labs - Every other week:  ESR and CRP   glipiZIDE 10 MG 24 hr tablet Commonly known as: GLUCOTROL XL Take 10 mg by mouth daily.   hydrALAZINE 25 MG tablet Commonly known as: APRESOLINE Take 1 tablet (25 mg total) by mouth 3 (three) times daily.   insulin glargine 100 UNIT/ML injection Commonly known as: LANTUS Inject 50 Units into the skin at bedtime.   insulin lispro 100 UNIT/ML injection Commonly known as: HUMALOG Inject 15 Units into the skin 3 (three) times daily after meals.   levothyroxine 137 MCG tablet Commonly known as: SYNTHROID Take 137 mcg by mouth daily before breakfast.   metFORMIN 1000 MG tablet Commonly known as: GLUCOPHAGE Take 1,000 mg by mouth 2 (two) times daily with a meal.   metroNIDAZOLE 500 MG tablet Commonly known as: Flagyl Take 1 tablet (500 mg total) by mouth 3 (three) times daily.   mometasone 50 MCG/ACT nasal  spray Commonly known as: NASONEX Place 2 sprays into the nose daily as needed (for seasonal allergies).   Opcon-A 0.027-0.315 % Soln Generic drug: Naphazoline-Pheniramine Place 1-2 drops into both eyes as needed (for itching).   Ozempic (0.25 or 0.5 MG/DOSE) 2 MG/1.5ML Sopn Generic drug: Semaglutide(0.25 or 0.5MG/DOS) Inject 0.5 mg into the skin every Wednesday.   PARoxetine 40 MG tablet Commonly known as: PAXIL Take 20 mg by mouth every morning.   pioglitazone 30 MG tablet Commonly known as: ACTOS Take 15 mg by mouth daily.   pramipexole 1 MG tablet Commonly known as: MIRAPEX Take 1 mg by mouth at bedtime.   vitamin  B-12 1000 MCG tablet Commonly known as: CYANOCOBALAMIN Take 1,000 mcg by mouth daily.            Home Infusion Instuctions  (From admission, onward)         Start     Ordered   01/03/19 0000  Home infusion instructions [facility] May follow [place] Dosing Protocol; May administer Cathflo as needed to maintain patency of vascular access device.; Flushing of vascular access device: per [facility] Protocol: 0.9% NaCl pre/post medica...    Question Answer Comment  Instructions May follow [place] Dosing Protocol   Instructions May administer Cathflo as needed to maintain patency of vascular access device.   Instructions Flushing of vascular access device: per [facility] Protocol: 0.9% NaCl pre/post medication administration and prn patency; Heparin 100 u/ml, 47m for implanted ports and Heparin 10u/ml, 531mfor all other central venous catheters.   Instructions May follow [facility] Anaphylaxis Protocol for First Dose Administration in the home: 0.9% NaCl at 25-50 ml/hr to maintain IV access for protocol meds. Epinephrine 0.3 ml IV/IM PRN and Benadryl 25-50 IV/IM PRN s/s of anaphylaxis.   Instructions [facility] Infusion Coordinator (RN) to assist per patient IV care needs in the home PRN.      01/03/19 1120         No Known Allergies  The results of  significant diagnostics from this hospitalization (including imaging, microbiology, ancillary and laboratory) are listed below for reference.    Significant Diagnostic Studies: Mr Hand Left W Wo Contrast  Result Date: 12/31/2018 CLINICAL DATA:  Nonhealing wounds since the patient suffered a laceration on the left index and long fingers with a butcher knife 8 months ago. Purulent drainage for 1 week. EXAM: MRI OF THE LEFT HAND WITHOUT AND WITH CONTRAST TECHNIQUE: Multiplanar, multisequence MR imaging of the left hand was performed before and after the administration of intravenous contrast. CONTRAST:  10 mL GADAVIST IV SOLN COMPARISON:  Plain films left hand 12/30/2018. FINDINGS: Bones/Joint/Cartilage Decreased T1 signal, increased T2 signal and postcontrast enhancement are seen throughout the middle and distal phalanges the index finger consistent osteomyelitis. Osteolysis the tuft of the index finger is identified and there is a transverse pathologic fracture through the base of the distal phalanx. There is loss of fat saturation in the middle and distal phalanges of the long, ring and little fingers without corresponding decreased T1 signal to suggest osteomyelitis. Small cyst or enchondroma in the head of the third metacarpal is incidentally noted. Ligaments Intact. Muscles and Tendons There is edema and enhancement of the sheath of the flexor tendons of the index finger and fluid in the sheath. The flexor digitorum profundus is torn and retracted just proximal to the head the second metacarpal. Soft tissues Extensive subcutaneous edema and enhancement are present about the index finger consistent with cellulitis. A skin wound is seen on the volar surface of the index finger at the level of the PIP joint. A fluid collection measuring 0.4 cm transverse x 0.6 cm AP x 4.5 cm craniocaudal tracks in the subcutaneous tissues from the PIP joint to the second MCP joint and is consistent with abscess. IMPRESSION:  Intense cellulitis about the index finger with an abscess in the volar soft tissues tracking from a skin wound at the level of the PIP joint to approximately the level of the second MCP joint. Osteomyelitis throughout the middle and distal phalanges of the index finger. Osteolysis of the tuft of the distal phalanx of the index finger  and pathologic transverse fracture through the proximal metaphysis of the distal phalanx are seen. Septic tenosynovitis with a complete tear of the flexor digitorum profundus tendon. The tendon is retracted just proximal to the articular surface of the head of the second metacarpal. Electronically Signed   By: Inge Rise M.D.   On: 12/31/2018 07:28   Dg Hand Complete Left  Result Date: 12/30/2018 CLINICAL DATA:  Left index finger pain for 2 weeks. Cut the tips of index finger. EXAM: LEFT HAND - COMPLETE 3+ VIEW COMPARISON:  None FINDINGS: Diffuse swelling about the hand but worse in the left index finger. Linear lucency and tuft resorption in the distal phalanx. Lucency at the base of the distal phalanx. Swelling involves the dorsum and volar surface of the hand proximally. IMPRESSION: Marked soft tissue swelling particularly about the index finger with signs of open fracture and/or osteomyelitis of the distal phalanx. No radiopaque foreign body. Electronically Signed   By: Zetta Bills M.D.   On: 12/30/2018 14:27   [place] Ekg Site Rite  Result Date: 01/02/2019 If Site Rite image not attached, placement could not be confirmed due to current cardiac rhythm.   Microbiology: Recent Results (from the past 240 hour(s))  SARS Coronavirus 2 by RT PCR (hospital order, performed in [facility] hospital lab) Nasopharyngeal Nasopharyngeal Swab     Status: None   Collection Time: 12/30/18  4:22 PM   Specimen: Nasopharyngeal Swab  Result Value Ref Range Status   SARS Coronavirus 2 NEGATIVE NEGATIVE Final    Comment: (NOTE) If result is NEGATIVE SARS-CoV-2 target nucleic  acids are NOT DETECTED. The SARS-CoV-2 RNA is generally detectable in upper and lower  respiratory specimens during the acute phase of infection. The lowest  concentration of SARS-CoV-2 viral copies this assay can detect is 250  copies / mL. A negative result does not preclude SARS-CoV-2 infection  and should not be used as the sole basis for treatment or other  patient management decisions.  A negative result may occur with  improper specimen collection / handling, submission of specimen other  than nasopharyngeal swab, presence of viral mutation(s) within the  areas targeted by this assay, and inadequate number of viral copies  (<250 copies / mL). A negative result must be combined with clinical  observations, patient history, and epidemiological information. If result is POSITIVE SARS-CoV-2 target nucleic acids are DETECTED. The SARS-CoV-2 RNA is generally detectable in upper and lower  respiratory specimens dur ing the acute phase of infection.  Positive  results are indicative of active infection with SARS-CoV-2.  Clinical  correlation with patient history and other diagnostic information is  necessary to determine patient infection status.  Positive results do  not rule out bacterial infection or co-infection with other viruses. If result is PRESUMPTIVE POSTIVE SARS-CoV-2 nucleic acids MAY BE PRESENT.   A presumptive positive result was obtained on the submitted specimen  and confirmed on repeat testing.  While 2019 novel coronavirus  (SARS-CoV-2) nucleic acids may be present in the submitted sample  additional confirmatory testing may be necessary for epidemiological  and / or clinical management purposes  to differentiate between  SARS-CoV-2 and other Sarbecovirus currently known to infect humans.  If clinically indicated additional testing with an alternate test  methodology [phone]) is advised. The SARS-CoV-2 RNA is generally  detectable in upper and lower respiratory sp  ecimens during the acute  phase of infection. The expected result is Negative. Fact Sheet for Patients:  StrictlyIdeas.no Fact Sheet for  Healthcare Providers: BankingDealers.co.za This test is not yet approved or cleared by the [place] and has been authorized for detection and/or diagnosis of SARS-CoV-2 by FDA under an Emergency Use Authorization (EUA).  This EUA will remain in effect (meaning this test can be used) for the duration of the COVID-19 declaration under Section 564(b)(1) of the Act, 21 U.S.C. section 360bbb-3(b)(1), unless the authorization is terminated or revoked sooner. Performed at [facility], [place] [address] [place], [place] [address]   Culture, blood (routine x 2)     Status: None (Preliminary result)   Collection Time: 12/30/18  5:10 PM   Specimen: BLOOD  Result Value Ref Range Status   Specimen Description BLOOD RIGHT ANTECUBITAL  Final   Special Requests   Final    BOTTLES DRAWN AEROBIC AND ANAEROBIC Blood Culture results may not be optimal due to an inadequate volume of blood received in culture bottles   Culture   Final    NO GROWTH 4 DAYS Performed at [facility], [place] [address] [place], [place] [address]    Report Status PENDING  Incomplete  Culture, blood (routine x 2)     Status: None (Preliminary result)   Collection Time: 12/30/18  5:10 PM   Specimen: BLOOD  Result Value Ref Range Status   Specimen Description BLOOD LEFT ANTECUBITAL  Final   Special Requests   Final    AEROBIC BOTTLE ONLY Blood Culture results may not be optimal due to an inadequate volume of blood received in culture bottles   Culture   Final    NO GROWTH 4 DAYS Performed at [facility], [place] [address], [place], [place] [address]    Report Status PENDING  Incomplete  Aerobic/Anaerobic Culture (surgical/deep wound)     Status: None (Preliminary result)   Collection Time: 12/30/18  5:25 PM   Specimen:  Wound; Abscess  Result Value Ref Range Status   Specimen Description WOUND LEFT FINGER  Final   Special Requests NONE  Final   Gram Stain   Final    RARE WBC PRESENT, PREDOMINANTLY PMN RARE GRAM POSITIVE COCCI    Culture   Final    FEW STAPHYLOCOCCUS AUREUS MODERATE EIKENELLA CORRODENS Usually susceptible to penicillin and other beta lactam agents,quinolones,macrolides and tetracyclines. HOLDING FOR POSSIBLE ANAEROBE    Report Status PENDING  Incomplete   Organism ID, Bacteria STAPHYLOCOCCUS AUREUS  Final      Susceptibility   Staphylococcus aureus - MIC*    CIPROFLOXACIN <=0.5 SENSITIVE Sensitive     ERYTHROMYCIN <=0.25 SENSITIVE Sensitive     GENTAMICIN <=0.5 SENSITIVE Sensitive     OXACILLIN 0.5 SENSITIVE Sensitive     TETRACYCLINE <=1 SENSITIVE Sensitive     VANCOMYCIN 1 SENSITIVE Sensitive     TRIMETH/SULFA <=10 SENSITIVE Sensitive     CLINDAMYCIN <=0.25 SENSITIVE Sensitive     RIFAMPIN <=0.5 SENSITIVE Sensitive     Inducible Clindamycin Value in next row Sensitive      NEGATIVEPerformed at [place] [address] [place], [place] [address]    * FEW STAPHYLOCOCCUS AUREUS  MRSA PCR Screening     Status: None   Collection Time: 12/31/18  2:30 AM   Specimen: Nasal Mucosa; Nasopharyngeal  Result Value Ref Range Status   MRSA by PCR NEGATIVE NEGATIVE Final    Comment:        The GeneXpert MRSA Assay (FDA approved for NASAL specimens only), is one component of a comprehensive  MRSA colonization surveillance program. It is not intended to diagnose MRSA infection nor to guide or monitor treatment for MRSA infections. Performed at [facility], [place] [address] [place], [place] [address]   Aerobic/Anaerobic Culture (surgical/deep wound)     Status: None (Preliminary result)   Collection Time: 01/01/19  8:48 AM   Specimen: Soft Tissue, Other  Result Value Ref Range Status   Specimen Description WOUND HAND LEFT  Final   Special Requests INDEX FINGER/PT ON  VANC ANCEF  Final   Gram Stain   Final    RARE WBC PRESENT, PREDOMINANTLY PMN RARE GRAM POSITIVE COCCI IN PAIRS FEW GRAM NEGATIVE RODS    Culture   Final    RARE STAPHYLOCOCCUS AUREUS HOLDING FOR POSSIBLE ANAEROBE Performed at [facility], [address] [place], [place] [address]    Report Status PENDING  Incomplete    Labs: Basic Metabolic Panel: Recent Labs  Lab 12/30/18 1336 12/31/18 0201 01/01/19 0351 01/03/19 0413  NA 133* 135 137 136  K 4.2 4.0 4.2 4.2  CL 96* 99 104 103  CO2 25 23 25 24   GLUCOSE 236* 228* 191* 218*  BUN 24* 23 16 13   CREATININE 1.40* 1.26* 1.09 1.15  CALCIUM 8.5* 8.2* 8.3* 8.0*   Liver Function Tests: Recent Labs  Lab 12/30/18 1336  AST 18  ALT 13  ALKPHOS 76  BILITOT 0.5  PROT 7.4  ALBUMIN 2.8*   No results for input(s): LIPASE, AMYLASE in the last 168 hours. No results for input(s): AMMONIA in the last 168 hours. CBC: Recent Labs  Lab 12/30/18 1336 12/31/18 0201 01/01/19 0351 01/03/19 0413  WBC 10.6* 8.6 7.2 6.9  NEUTROABS 8.6*  --  5.4 5.0  HGB 11.8* 10.5* 10.7* 10.0*  HCT 37.7* 32.4* 32.8* 31.4*  MCV 90.4 88.5 86.8 88.7  PLT 334 282 274 277   Cardiac Enzymes: No results for input(s): CKTOTAL, CKMB, CKMBINDEX, TROPONINI in the last 168 hours. BNP: BNP (last 3 results) No results for input(s): BNP in the last 8760 hours.  ProBNP (last 3 results) No results for input(s): PROBNP in the last 8760 hours.  CBG: Recent Labs  Lab 01/02/19 1705 01/02/19 2111 01/03/19 0629 01/03/19 0847 01/03/19 1201  GLUCAP 234* 226* 177* 175* 173*    Principal Problem:   Finger infection-Left index finger Active Problems:   Hypothyroidism   Hypertension   Hyperlipidemia   Diabetes mellitus without complication (HCC)   Depression   AKI (acute kidney injury) ([place])   Lactic acidosis   OSA on CPAP   RLS (restless legs syndrome)   Osteomyelitis of finger (HCC)   MSSA (methicillin susceptible Staphylococcus aureus)  infection  Time coordinating discharge: 38 minutes  Signed:        Marvin Maenza, DO [facility]  01/03/2019, 4:37 PM

## 2019-01-03 NOTE — Plan of Care (Signed)
  Problem: Education: Goal: Knowledge of General Education information will improve Description: Including pain rating scale, medication(s)/side effects and non-pharmacologic comfort measures Outcome: Progressing   Problem: Clinical Measurements: Goal: Will remain free from infection Outcome: Progressing Goal: Respiratory complications will improve Outcome: Progressing   Problem: Activity: Goal: Risk for activity intolerance will decrease Outcome: Progressing   Problem: Pain Managment: Goal: General experience of comfort will improve Outcome: Progressing   Problem: Safety: Goal: Ability to remain free from injury will improve Outcome: Progressing   Problem: Skin Integrity: Goal: Risk for impaired skin integrity will decrease Outcome: Progressing   PT needs to meet with [facility] prior to discharge. PICC placed this AM. Son in the room with questions for [facility] about use of PICC line.   Jenene Slicker, RN 01/03/2019 [phone]

## 2019-01-04 DIAGNOSIS — M869 Osteomyelitis, unspecified: Secondary | ICD-10-CM | POA: Diagnosis not present

## 2019-01-04 DIAGNOSIS — A4901 Methicillin susceptible Staphylococcus aureus infection, unspecified site: Secondary | ICD-10-CM | POA: Diagnosis not present

## 2019-01-04 LAB — CULTURE, BLOOD (ROUTINE X 2)
Culture: NO GROWTH
Culture: NO GROWTH

## 2019-01-05 LAB — AEROBIC/ANAEROBIC CULTURE W GRAM STAIN (SURGICAL/DEEP WOUND)

## 2019-01-06 LAB — AEROBIC/ANAEROBIC CULTURE W GRAM STAIN (SURGICAL/DEEP WOUND)

## 2019-01-08 ENCOUNTER — Encounter (HOSPITAL_COMMUNITY): Payer: Self-pay

## 2019-01-08 ENCOUNTER — Encounter (HOSPITAL_COMMUNITY)
Admission: RE | Admit: 2019-01-08 | Discharge: 2019-01-08 | Disposition: A | Discharge status: Home / Self Care | Payer: PPO | Source: Ambulatory Visit | Attending: Infectious Disease | Admitting: Infectious Disease

## 2019-01-08 ENCOUNTER — Other Ambulatory Visit: Payer: Self-pay

## 2019-01-08 DIAGNOSIS — M65842 Other synovitis and tenosynovitis, left hand: Secondary | ICD-10-CM | POA: Insufficient documentation

## 2019-01-08 DIAGNOSIS — M868X4 Other osteomyelitis, hand: Secondary | ICD-10-CM | POA: Diagnosis not present

## 2019-01-08 LAB — CBC WITH DIFFERENTIAL/PLATELET
Abs Immature Granulocytes: 0.02 10*3/uL (ref 0.00–0.07)
Basophils Absolute: 0 10*3/uL (ref 0.0–0.1)
Basophils Relative: 1 %
Eosinophils Absolute: 0.2 10*3/uL (ref 0.0–0.5)
Eosinophils Relative: 2 %
HCT: 34.2 % — ABNORMAL LOW (ref 39.0–52.0)
Hemoglobin: 10.5 g/dL — ABNORMAL LOW (ref 13.0–17.0)
Immature Granulocytes: 0 %
Lymphocytes Relative: 13 %
Lymphs Abs: 0.9 10*3/uL (ref 0.7–4.0)
MCH: 27.7 pg (ref 26.0–34.0)
MCHC: 30.7 g/dL (ref 30.0–36.0)
MCV: 90.2 fL (ref 80.0–100.0)
Monocytes Absolute: 0.5 10*3/uL (ref 0.1–1.0)
Monocytes Relative: 6 %
Neutro Abs: 5.8 10*3/uL (ref 1.7–7.7)
Neutrophils Relative %: 78 %
Platelets: 274 10*3/uL (ref 150–400)
RBC: 3.79 MIL/uL — ABNORMAL LOW (ref 4.22–5.81)
RDW: 13.9 % (ref 11.5–15.5)
WBC: 7.4 10*3/uL (ref 4.0–10.5)
nRBC: 0 % (ref 0.0–0.2)

## 2019-01-08 LAB — COMPREHENSIVE METABOLIC PANEL
ALT: 25 U/L (ref 0–44)
AST: 38 U/L (ref 15–41)
Albumin: 2.8 g/dL — ABNORMAL LOW (ref 3.5–5.0)
Alkaline Phosphatase: 73 U/L (ref 38–126)
Anion gap: 8 (ref 5–15)
BUN: 15 mg/dL (ref 8–23)
CO2: 23 mmol/L (ref 22–32)
Calcium: 8.1 mg/dL — ABNORMAL LOW (ref 8.9–10.3)
Chloride: 105 mmol/L (ref 98–111)
Creatinine, Ser: 1.06 mg/dL (ref 0.61–1.24)
GFR calc Af Amer: >60 mL/min (ref >60)
GFR calc non Af Amer: >60 mL/min (ref >60)
Glucose, Bld: 205 mg/dL — ABNORMAL HIGH (ref 70–99)
Potassium: 4.1 mmol/L (ref 3.5–5.1)
Sodium: 136 mmol/L (ref 135–145)
Total Bilirubin: 0.5 mg/dL (ref 0.3–1.2)
Total Protein: 6.2 g/dL — ABNORMAL LOW (ref 6.5–8.1)

## 2019-01-08 MED ORDER — HEPARIN SOD (PORK) LOCK FLUSH 100 UNIT/ML IV SOLN
250.0000 [IU] | Freq: Once | INTRAVENOUS | Status: AC
  Administered 2019-01-08: 250 [IU] via INTRAVENOUS

## 2019-01-11 DIAGNOSIS — A4901 Methicillin susceptible Staphylococcus aureus infection, unspecified site: Secondary | ICD-10-CM | POA: Diagnosis not present

## 2019-01-11 DIAGNOSIS — M869 Osteomyelitis, unspecified: Secondary | ICD-10-CM | POA: Diagnosis not present

## 2019-01-14 DIAGNOSIS — L02512 Cutaneous abscess of left hand: Secondary | ICD-10-CM | POA: Diagnosis not present

## 2019-01-15 ENCOUNTER — Encounter (HOSPITAL_COMMUNITY)
Admission: RE | Admit: 2019-01-15 | Discharge: 2019-01-15 | Disposition: A | Discharge status: Home / Self Care | Payer: PPO | Source: Ambulatory Visit | Attending: Infectious Disease | Admitting: Infectious Disease

## 2019-01-15 ENCOUNTER — Other Ambulatory Visit: Payer: Self-pay

## 2019-01-15 DIAGNOSIS — I10 Essential (primary) hypertension: Secondary | ICD-10-CM | POA: Diagnosis not present

## 2019-01-15 DIAGNOSIS — M65842 Other synovitis and tenosynovitis, left hand: Secondary | ICD-10-CM | POA: Diagnosis not present

## 2019-01-15 DIAGNOSIS — M7110 Other infective bursitis, unspecified site: Secondary | ICD-10-CM | POA: Diagnosis not present

## 2019-01-15 DIAGNOSIS — E1165 Type 2 diabetes mellitus with hyperglycemia: Secondary | ICD-10-CM | POA: Diagnosis not present

## 2019-01-15 DIAGNOSIS — Z23 Encounter for immunization: Secondary | ICD-10-CM | POA: Diagnosis not present

## 2019-01-15 LAB — CBC WITH DIFFERENTIAL/PLATELET
Abs Immature Granulocytes: 0.02 10*3/uL (ref 0.00–0.07)
Basophils Absolute: 0 10*3/uL (ref 0.0–0.1)
Basophils Relative: 0 %
Eosinophils Absolute: 0.2 10*3/uL (ref 0.0–0.5)
Eosinophils Relative: 2 %
HCT: 34.5 % — ABNORMAL LOW (ref 39.0–52.0)
Hemoglobin: 10.5 g/dL — ABNORMAL LOW (ref 13.0–17.0)
Immature Granulocytes: 0 %
Lymphocytes Relative: 14 %
Lymphs Abs: 1 10*3/uL (ref 0.7–4.0)
MCH: 27.9 pg (ref 26.0–34.0)
MCHC: 30.4 g/dL (ref 30.0–36.0)
MCV: 91.5 fL (ref 80.0–100.0)
Monocytes Absolute: 0.6 10*3/uL (ref 0.1–1.0)
Monocytes Relative: 8 %
Neutro Abs: 5.3 10*3/uL (ref 1.7–7.7)
Neutrophils Relative %: 76 %
Platelets: 227 10*3/uL (ref 150–400)
RBC: 3.77 MIL/uL — ABNORMAL LOW (ref 4.22–5.81)
RDW: 14.9 % (ref 11.5–15.5)
WBC: 7.1 10*3/uL (ref 4.0–10.5)
nRBC: 0 % (ref 0.0–0.2)

## 2019-01-15 LAB — COMPREHENSIVE METABOLIC PANEL WITH GFR
ALT: 20 U/L (ref 0–44)
AST: 22 U/L (ref 15–41)
Albumin: 2.8 g/dL — ABNORMAL LOW (ref 3.5–5.0)
Alkaline Phosphatase: 68 U/L (ref 38–126)
Anion gap: 8 (ref 5–15)
BUN: 13 mg/dL (ref 8–23)
CO2: 24 mmol/L (ref 22–32)
Calcium: 8.1 mg/dL — ABNORMAL LOW (ref 8.9–10.3)
Chloride: 105 mmol/L (ref 98–111)
Creatinine, Ser: 1.01 mg/dL (ref 0.61–1.24)
GFR calc Af Amer: >60 mL/min (ref >60)
GFR calc non Af Amer: >60 mL/min (ref >60)
Glucose, Bld: 152 mg/dL — ABNORMAL HIGH (ref 70–99)
Potassium: 4.1 mmol/L (ref 3.5–5.1)
Sodium: 137 mmol/L (ref 135–145)
Total Bilirubin: 0.6 mg/dL (ref 0.3–1.2)
Total Protein: 6.3 g/dL — ABNORMAL LOW (ref 6.5–8.1)

## 2019-01-15 LAB — SEDIMENTATION RATE: Sed Rate: 55 mm/h — ABNORMAL HIGH (ref 0–16)

## 2019-01-15 LAB — C-REACTIVE PROTEIN: CRP: <0.8 mg/dL (ref <1.0)

## 2019-01-15 MED ORDER — HEPARIN SOD (PORK) LOCK FLUSH 100 UNIT/ML IV SOLN
500.0000 [IU] | Freq: Once | INTRAVENOUS | Status: AC
  Administered 2019-01-15: 250 [IU] via INTRAVENOUS

## 2019-01-15 MED ORDER — SODIUM CHLORIDE 0.9% FLUSH
10.0000 mL | INTRAVENOUS | Status: DC
  Administered 2019-01-15: 10 mL via INTRAVENOUS

## 2019-01-18 DIAGNOSIS — A4901 Methicillin susceptible Staphylococcus aureus infection, unspecified site: Secondary | ICD-10-CM | POA: Diagnosis not present

## 2019-01-18 DIAGNOSIS — M869 Osteomyelitis, unspecified: Secondary | ICD-10-CM | POA: Diagnosis not present

## 2019-01-22 ENCOUNTER — Encounter (HOSPITAL_COMMUNITY)
Admission: RE | Admit: 2019-01-22 | Discharge: 2019-01-22 | Disposition: A | Discharge status: Home / Self Care | Payer: PPO | Source: Ambulatory Visit | Attending: Internal Medicine | Admitting: Internal Medicine

## 2019-01-22 ENCOUNTER — Other Ambulatory Visit: Payer: Self-pay

## 2019-01-22 DIAGNOSIS — M6588 Other synovitis and tenosynovitis, other site: Secondary | ICD-10-CM | POA: Diagnosis not present

## 2019-01-22 DIAGNOSIS — L03012 Cellulitis of left finger: Secondary | ICD-10-CM | POA: Insufficient documentation

## 2019-01-22 DIAGNOSIS — M869 Osteomyelitis, unspecified: Secondary | ICD-10-CM | POA: Insufficient documentation

## 2019-01-22 LAB — BASIC METABOLIC PANEL
Anion gap: 8 (ref 5–15)
BUN: 16 mg/dL (ref 8–23)
CO2: 24 mmol/L (ref 22–32)
Calcium: 8.2 mg/dL — ABNORMAL LOW (ref 8.9–10.3)
Chloride: 105 mmol/L (ref 98–111)
Creatinine, Ser: 0.99 mg/dL (ref 0.61–1.24)
GFR calc Af Amer: >60 mL/min (ref >60)
GFR calc non Af Amer: >60 mL/min (ref >60)
Glucose, Bld: 216 mg/dL — ABNORMAL HIGH (ref 70–99)
Potassium: 4.4 mmol/L (ref 3.5–5.1)
Sodium: 137 mmol/L (ref 135–145)

## 2019-01-22 LAB — CBC WITH DIFFERENTIAL/PLATELET
Abs Immature Granulocytes: 0.01 10*3/uL (ref 0.00–0.07)
Basophils Absolute: 0 10*3/uL (ref 0.0–0.1)
Basophils Relative: 0 %
Eosinophils Absolute: 0.1 10*3/uL (ref 0.0–0.5)
Eosinophils Relative: 2 %
HCT: 34.8 % — ABNORMAL LOW (ref 39.0–52.0)
Hemoglobin: 10.5 g/dL — ABNORMAL LOW (ref 13.0–17.0)
Immature Granulocytes: 0 %
Lymphocytes Relative: 12 %
Lymphs Abs: 0.8 10*3/uL (ref 0.7–4.0)
MCH: 27.5 pg (ref 26.0–34.0)
MCHC: 30.2 g/dL (ref 30.0–36.0)
MCV: 91.1 fL (ref 80.0–100.0)
Monocytes Absolute: 0.6 10*3/uL (ref 0.1–1.0)
Monocytes Relative: 9 %
Neutro Abs: 5 10*3/uL (ref 1.7–7.7)
Neutrophils Relative %: 77 %
Platelets: 220 10*3/uL (ref 150–400)
RBC: 3.82 MIL/uL — ABNORMAL LOW (ref 4.22–5.81)
RDW: 15.4 % (ref 11.5–15.5)
WBC: 6.6 10*3/uL (ref 4.0–10.5)
nRBC: 0 % (ref 0.0–0.2)

## 2019-01-22 MED ORDER — HEPARIN SOD (PORK) LOCK FLUSH 100 UNIT/ML IV SOLN
500.0000 [IU] | Freq: Once | INTRAVENOUS | Status: AC
  Administered 2019-01-22: 13:00:00 500 [IU] via INTRAVENOUS

## 2019-01-24 DIAGNOSIS — E1142 Type 2 diabetes mellitus with diabetic polyneuropathy: Secondary | ICD-10-CM | POA: Diagnosis not present

## 2019-01-24 DIAGNOSIS — B351 Tinea unguium: Secondary | ICD-10-CM | POA: Diagnosis not present

## 2019-01-25 DIAGNOSIS — A4901 Methicillin susceptible Staphylococcus aureus infection, unspecified site: Secondary | ICD-10-CM | POA: Diagnosis not present

## 2019-01-25 DIAGNOSIS — M869 Osteomyelitis, unspecified: Secondary | ICD-10-CM | POA: Diagnosis not present

## 2019-01-27 ENCOUNTER — Ambulatory Visit (INDEPENDENT_AMBULATORY_CARE_PROVIDER_SITE_OTHER): Payer: PPO | Admitting: Infectious Disease

## 2019-01-27 ENCOUNTER — Other Ambulatory Visit: Payer: Self-pay

## 2019-01-27 ENCOUNTER — Telehealth: Payer: Self-pay

## 2019-01-27 ENCOUNTER — Encounter: Payer: Self-pay | Admitting: Infectious Disease

## 2019-01-27 VITALS — BP 169/73 | HR 62 | Temp 98.6°F | Wt >= 6400 oz

## 2019-01-27 DIAGNOSIS — M869 Osteomyelitis, unspecified: Secondary | ICD-10-CM | POA: Diagnosis not present

## 2019-01-27 DIAGNOSIS — A4901 Methicillin susceptible Staphylococcus aureus infection, unspecified site: Secondary | ICD-10-CM | POA: Diagnosis not present

## 2019-01-27 DIAGNOSIS — L089 Local infection of the skin and subcutaneous tissue, unspecified: Secondary | ICD-10-CM | POA: Diagnosis not present

## 2019-01-27 NOTE — Progress Notes (Signed)
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   MRN: [id number]  HPI   73 y.o. male with significant PMH of diabetes mellitus, HTN, hyperlipidemia, hypothyroidism, depression, and OSA who presented for a left index finger infection. 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   Pt has remained afebrile. WBC 7.2. Blood culture from 10/12 with no growth to date. Left finger wound culture growing Staph aureus and Eikenella corrodens on culture  He is currently continue on ceftriaxone and oral metronidazole and is due to finish antibiotics at the end of the month.  He is seen Dr. Grandville Silos in follow-up was quite happy with his progress.  The patient is doing well to his wound seems to be nicely healed up he says does have some phantom finger pain at times.  Past Medical History:  Diagnosis Date   Depression    Diabetes mellitus without complication ([place])    Finger infection 12/31/2018   Hyperlipidemia    Hypertension    Hypothyroidism    Shortness of breath    with exertion   Sleep apnea    pt is supposed to wear CPAP, but doesn't wear it    Past Surgical History:  Procedure Laterality Date   AMPUTATION Left 01/01/2019   Procedure: AMPUTATION LEFT INDEX FINGER;  Surgeon: [name], MD;  Location: [place];  Service: Orthopedics;  Laterality: Left;   CATARACT EXTRACTION W/PHACO Right 11/21/2012   Procedure: CATARACT EXTRACTION PHACO AND INTRAOCULAR LENS PLACEMENT (IOC);  Surgeon: Tonny Branch, MD;  Location: [facility];  Service: Ophthalmology;  Laterality: Right;  CDE:19.67   CATARACT EXTRACTION W/PHACO Left 12/19/2012   Procedure: CATARACT EXTRACTION PHACO AND INTRAOCULAR LENS PLACEMENT (IOC);  Surgeon: Tonny Branch, MD;  Location: [facility];  Service: Ophthalmology;  Laterality: Left;  CDE:8.00   KNEE ARTHROSCOPY Left     ROTATOR CUFF REPAIR Right     Family History  Problem Relation Age of Onset   Diabetes Mellitus II Father    Stroke Father       Social History   Socioeconomic History   Marital status: Widowed    Spouse name: Not on file   Number of children: Not on file   Years of education: Not on file   Highest education level: Not on file  Occupational History   Not on file  Social Needs   Financial resource strain: Not on file   Food insecurity    Worry: Not on file    Inability: Not on file   Transportation needs    Medical: Not on file    Non-medical: Not on file  Tobacco Use   Smoking status: Former Smoker    Packs/day: 2.00    Years: 15.00    Pack years: 30.00    Types: Cigarettes   Smokeless tobacco: Never Used  Substance and Sexual Activity   Alcohol use: Yes    Alcohol/week: 2.0 standard drinks    Types: 2 Cans of beer per week   Drug use: No   Sexual activity: Not on file  Lifestyle   Physical activity    Days per week: Not on file    Minutes per session: Not on file   Stress: Not on file  Relationships   Social connections    Talks on phone: Not on file  Gets together: Not on file    Attends religious service: Not on file    Active member of club or organization: Not on file    Attends meetings of clubs or organizations: Not on file    Relationship status: Not on file  Other Topics Concern   Not on file  Social History Narrative   Not on file    No Known Allergies   Current Outpatient Medications:    amLODipine (NORVASC) 10 MG tablet, Take 5 mg by mouth daily. , Disp: , Rfl:    aspirin EC 81 MG tablet, Take 81 mg by mouth daily., Disp: , Rfl:    atorvastatin (LIPITOR) 20 MG tablet, Take 20 mg by mouth daily., Disp: , Rfl:    cefTRIAXone (ROCEPHIN) IVPB, Inject 2 g into the vein daily. Indication:  Osteomyelitis  Last Day of Therapy:  02/11/2019 Labs - Once weekly:  CBC/D and BMP, Labs - Every other week:  ESR and CRP, Disp: 40  Units, Rfl: 0   glipiZIDE (GLUCOTROL XL) 10 MG 24 hr tablet, Take 10 mg by mouth daily., Disp: , Rfl:    hydrALAZINE (APRESOLINE) 25 MG tablet, Take 1 tablet (25 mg total) by mouth 3 (three) times daily., Disp: 90 tablet, Rfl: 0   insulin glargine (LANTUS) 100 UNIT/ML injection, Inject 50 Units into the skin at bedtime., Disp: , Rfl:    insulin lispro (HUMALOG) 100 UNIT/ML injection, Inject 15 Units into the skin 3 (three) times daily after meals. , Disp: , Rfl:    levothyroxine (SYNTHROID, LEVOTHROID) 137 MCG tablet, Take 137 mcg by mouth daily before breakfast., Disp: , Rfl:    metFORMIN (GLUCOPHAGE) 1000 MG tablet, Take 1,000 mg by mouth 2 (two) times daily with a meal., Disp: , Rfl:    metroNIDAZOLE (FLAGYL) 500 MG tablet, Take 1 tablet (500 mg total) by mouth 3 (three) times daily., Disp: 120 tablet, Rfl: 0   mometasone (NASONEX) 50 MCG/ACT nasal spray, Place 2 sprays into the nose daily as needed (for seasonal allergies). , Disp: , Rfl:    Naphazoline-Pheniramine (OPCON-A) 0.027-0.315 % SOLN, Place 1-2 drops into both eyes as needed (for itching)., Disp: , Rfl:    PARoxetine (PAXIL) 40 MG tablet, Take 20 mg by mouth every morning., Disp: , Rfl:    pioglitazone (ACTOS) 30 MG tablet, Take 15 mg by mouth daily. , Disp: , Rfl:    pramipexole (MIRAPEX) 1 MG tablet, Take 1 mg by mouth at bedtime., Disp: , Rfl:    vitamin B-12 (CYANOCOBALAMIN) 1000 MCG tablet, Take 1,000 mcg by mouth daily., Disp: , Rfl:    Semaglutide,0.25 or 0.5MG/DOS, (OZEMPIC, 0.25 OR 0.5 MG/DOSE,) 2 MG/1.5ML SOPN, Inject 0.5 mg into the skin every Wednesday., Disp: , Rfl:    Past Medical History:  Diagnosis Date   Depression    Diabetes mellitus without complication ([place])    Finger infection 12/31/2018   Hyperlipidemia    Hypertension    Hypothyroidism    Shortness of breath    with exertion   Sleep apnea    pt is supposed to wear CPAP, but doesn't wear it    Past Surgical History:  Procedure  Laterality Date   AMPUTATION Left 01/01/2019   Procedure: AMPUTATION LEFT INDEX FINGER;  Surgeon: [name], MD;  Location: [place];  Service: Orthopedics;  Laterality: Left;   CATARACT EXTRACTION W/PHACO Right 11/21/2012   Procedure: CATARACT EXTRACTION PHACO AND INTRAOCULAR LENS PLACEMENT (IOC);  Surgeon: Tonny Branch, MD;  [facility]: [facility];  Service: Ophthalmology;  Laterality: Right;  CDE:19.67   CATARACT EXTRACTION W/PHACO Left 12/19/2012   Procedure: CATARACT EXTRACTION PHACO AND INTRAOCULAR LENS PLACEMENT (IOC);  Surgeon: Tonny Branch, MD;  Location: [facility];  Service: Ophthalmology;  Laterality: Left;  CDE:8.00   KNEE ARTHROSCOPY Left    ROTATOR CUFF REPAIR Right     Family History  Problem Relation Age of Onset   Diabetes Mellitus II Father    Stroke Father       Social History   Socioeconomic History   Marital status: Widowed    Spouse name: Not on file   Number of children: Not on file   Years of education: Not on file   Highest education level: Not on file  Occupational History   Not on file  Social Needs   Financial resource strain: Not on file   Food insecurity    Worry: Not on file    Inability: Not on file   Transportation needs    Medical: Not on file    Non-medical: Not on file  Tobacco Use   Smoking status: Former Smoker    Packs/day: 2.00    Years: 15.00    Pack years: 30.00    Types: Cigarettes   Smokeless tobacco: Never Used  Substance and Sexual Activity   Alcohol use: Yes    Alcohol/week: 2.0 standard drinks    Types: 2 Cans of beer per week   Drug use: No   Sexual activity: Not on file  Lifestyle   Physical activity    Days per week: Not on file    Minutes per session: Not on file   Stress: Not on file  Relationships   Social connections    Talks on phone: Not on file    Gets together: Not on file    Attends religious service: Not on file    Active member of club or organization: Not on file    Attends meetings of  clubs or organizations: Not on file    Relationship status: Not on file  Other Topics Concern   Not on file  Social History Narrative   Not on file    No Known Allergies   Current Outpatient Medications:    amLODipine (NORVASC) 10 MG tablet, Take 5 mg by mouth daily. , Disp: , Rfl:    aspirin EC 81 MG tablet, Take 81 mg by mouth daily., Disp: , Rfl:    atorvastatin (LIPITOR) 20 MG tablet, Take 20 mg by mouth daily., Disp: , Rfl:    cefTRIAXone (ROCEPHIN) IVPB, Inject 2 g into the vein daily. Indication:  Osteomyelitis  Last Day of Therapy:  02/11/2019 Labs - Once weekly:  CBC/D and BMP, Labs - Every other week:  ESR and CRP, Disp: 40 Units, Rfl: 0   glipiZIDE (GLUCOTROL XL) 10 MG 24 hr tablet, Take 10 mg by mouth daily., Disp: , Rfl:    hydrALAZINE (APRESOLINE) 25 MG tablet, Take 1 tablet (25 mg total) by mouth 3 (three) times daily., Disp: 90 tablet, Rfl: 0   insulin glargine (LANTUS) 100 UNIT/ML injection, Inject 50 Units into the skin at bedtime., Disp: , Rfl:    insulin lispro (HUMALOG) 100 UNIT/ML injection, Inject 15 Units into the skin 3 (three) times daily after meals. , Disp: , Rfl:    levothyroxine (SYNTHROID, LEVOTHROID) 137 MCG tablet, Take 137 mcg by mouth daily before breakfast., Disp: , Rfl:    metFORMIN (GLUCOPHAGE) 1000 MG tablet, Take 1,000 mg by mouth  2 (two) times daily with a meal., Disp: , Rfl:    metroNIDAZOLE (FLAGYL) 500 MG tablet, Take 1 tablet (500 mg total) by mouth 3 (three) times daily., Disp: 120 tablet, Rfl: 0   mometasone (NASONEX) 50 MCG/ACT nasal spray, Place 2 sprays into the nose daily as needed (for seasonal allergies). , Disp: , Rfl:    Naphazoline-Pheniramine (OPCON-A) 0.027-0.315 % SOLN, Place 1-2 drops into both eyes as needed (for itching)., Disp: , Rfl:    PARoxetine (PAXIL) 40 MG tablet, Take 20 mg by mouth every morning., Disp: , Rfl:    pioglitazone (ACTOS) 30 MG tablet, Take 15 mg by mouth daily. , Disp: , Rfl:     pramipexole (MIRAPEX) 1 MG tablet, Take 1 mg by mouth at bedtime., Disp: , Rfl:    vitamin B-12 (CYANOCOBALAMIN) 1000 MCG tablet, Take 1,000 mcg by mouth daily., Disp: , Rfl:    Semaglutide,0.25 or 0.5MG/DOS, (OZEMPIC, 0.25 OR 0.5 MG/DOSE,) 2 MG/1.5ML SOPN, Inject 0.5 mg into the skin every Wednesday., Disp: , Rfl:    Review of Systems  Constitutional: Negative for chills and fever.  HENT: Negative for congestion and sore throat.   Eyes: Negative for photophobia.  Respiratory: Negative for cough, shortness of breath and wheezing.   Cardiovascular: Negative for chest pain, palpitations and leg swelling.  Gastrointestinal: Negative for abdominal pain, blood in stool, constipation, diarrhea, nausea and vomiting.  Genitourinary: Negative for dysuria, flank pain and hematuria.  Musculoskeletal: Negative for back pain and myalgias.  Skin: Negative for rash.  Neurological: Negative for dizziness, weakness and headaches.  Hematological: Does not bruise/bleed easily.  Psychiatric/Behavioral: Negative for suicidal ideas.       Objective:   Physical Exam Constitutional:      General: He is not in acute distress.    Appearance: Normal appearance. He is well-developed. He is not ill-appearing or diaphoretic.  HENT:     Head: Normocephalic and atraumatic.     Right Ear: Hearing and external ear normal.     Left Ear: Hearing and external ear normal.     Nose: No nasal deformity or rhinorrhea.  Eyes:     General: No scleral icterus.    Conjunctiva/sclera: Conjunctivae normal.     Right eye: Right conjunctiva is not injected.     Left eye: Left conjunctiva is not injected.     Pupils: Pupils are equal, round, and reactive to light.  Neck:     Musculoskeletal: Normal range of motion and neck supple.     Vascular: No JVD.  Cardiovascular:     Rate and Rhythm: Normal rate and regular rhythm.     Heart sounds: S1 normal and S2 normal.  Abdominal:     General: There is no distension.      Palpations: Abdomen is soft.  Musculoskeletal: Normal range of motion.     Right shoulder: Normal.     Left shoulder: Normal.     Right hip: Normal.     Left hip: Normal.     Right knee: Normal.     Left knee: Normal.  Lymphadenopathy:     Head:     Right side of head: No submandibular, preauricular or posterior auricular adenopathy.     Left side of head: No submandibular, preauricular or posterior auricular adenopathy.     Cervical: No cervical adenopathy.     Right cervical: No superficial or deep cervical adenopathy.    Left cervical: No superficial or deep cervical adenopathy.  Skin:  General: Skin is warm and dry.     Coloration: Skin is not pale.     Findings: No abrasion, bruising, ecchymosis, erythema, lesion or rash.     Nails: There is no clubbing.   Neurological:     General: No focal deficit present.     Mental Status: He is alert and oriented to person, place, and time.     Sensory: No sensory deficit.     Coordination: Coordination normal.     Gait: Gait normal.  Psychiatric:        Attention and Perception: He is attentive.        Mood and Affect: Mood normal.        Speech: Speech normal.        Behavior: Behavior normal. Behavior is cooperative.        Thought Content: Thought content normal.        Judgment: Judgment normal.    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  He is doing well and PICC line can be removed at the end of his course of therapy.  I would like to have him  follow-up with him at the end of January.

## 2019-01-27 NOTE — Telephone Encounter (Signed)
Called [facility] to confirm patient's antibiotic end date, and pull picc orders. Spoke with Jackelyn Poling who states they have patient's end date for 11/25. She does have orders to pull picc after last dose. MD is aware.  [place]

## 2019-01-29 ENCOUNTER — Encounter (HOSPITAL_COMMUNITY)
Admission: RE | Admit: 2019-01-29 | Discharge: 2019-01-29 | Disposition: A | Discharge status: Home / Self Care | Payer: PPO | Source: Ambulatory Visit | Attending: Internal Medicine | Admitting: Internal Medicine

## 2019-01-29 ENCOUNTER — Other Ambulatory Visit: Payer: Self-pay

## 2019-01-29 DIAGNOSIS — M6588 Other synovitis and tenosynovitis, other site: Secondary | ICD-10-CM | POA: Diagnosis not present

## 2019-01-29 LAB — CBC WITH DIFFERENTIAL/PLATELET
Abs Immature Granulocytes: 0.02 10*3/uL (ref 0.00–0.07)
Basophils Absolute: 0 10*3/uL (ref 0.0–0.1)
Basophils Relative: 1 %
Eosinophils Absolute: 0.2 10*3/uL (ref 0.0–0.5)
Eosinophils Relative: 2 %
HCT: 34.3 % — ABNORMAL LOW (ref 39.0–52.0)
Hemoglobin: 10.4 g/dL — ABNORMAL LOW (ref 13.0–17.0)
Immature Granulocytes: 0 %
Lymphocytes Relative: 11 %
Lymphs Abs: 0.8 10*3/uL (ref 0.7–4.0)
MCH: 27.7 pg (ref 26.0–34.0)
MCHC: 30.3 g/dL (ref 30.0–36.0)
MCV: 91.5 fL (ref 80.0–100.0)
Monocytes Absolute: 0.6 10*3/uL (ref 0.1–1.0)
Monocytes Relative: 9 %
Neutro Abs: 5 10*3/uL (ref 1.7–7.7)
Neutrophils Relative %: 77 %
Platelets: 199 10*3/uL (ref 150–400)
RBC: 3.75 MIL/uL — ABNORMAL LOW (ref 4.22–5.81)
RDW: 15.7 % — ABNORMAL HIGH (ref 11.5–15.5)
WBC: 6.6 10*3/uL (ref 4.0–10.5)
nRBC: 0 % (ref 0.0–0.2)

## 2019-01-29 LAB — COMPREHENSIVE METABOLIC PANEL
ALT: 16 U/L (ref 0–44)
AST: 17 U/L (ref 15–41)
Albumin: 3.1 g/dL — ABNORMAL LOW (ref 3.5–5.0)
Alkaline Phosphatase: 62 U/L (ref 38–126)
Anion gap: 9 (ref 5–15)
BUN: 14 mg/dL (ref 8–23)
CO2: 24 mmol/L (ref 22–32)
Calcium: 8 mg/dL — ABNORMAL LOW (ref 8.9–10.3)
Chloride: 102 mmol/L (ref 98–111)
Creatinine, Ser: 1.01 mg/dL (ref 0.61–1.24)
GFR calc Af Amer: >60 mL/min (ref >60)
GFR calc non Af Amer: >60 mL/min (ref >60)
Glucose, Bld: 254 mg/dL — ABNORMAL HIGH (ref 70–99)
Potassium: 4.3 mmol/L (ref 3.5–5.1)
Sodium: 135 mmol/L (ref 135–145)
Total Bilirubin: 0.6 mg/dL (ref 0.3–1.2)
Total Protein: 6.5 g/dL (ref 6.5–8.1)

## 2019-01-29 LAB — SEDIMENTATION RATE: Sed Rate: 60 mm/hr — ABNORMAL HIGH (ref 0–16)

## 2019-01-29 LAB — C-REACTIVE PROTEIN: CRP: <0.8 mg/dL (ref <1.0)

## 2019-01-29 MED ORDER — HEPARIN SOD (PORK) LOCK FLUSH 100 UNIT/ML IV SOLN
500.0000 [IU] | Freq: Once | INTRAVENOUS | Status: AC
  Administered 2019-01-29: 500 [IU] via INTRAVENOUS

## 2019-01-29 MED ORDER — SODIUM CHLORIDE 0.9% FLUSH
10.0000 mL | INTRAVENOUS | Status: DC
  Administered 2019-01-29: 10 mL via INTRAVENOUS

## 2019-02-01 DIAGNOSIS — M869 Osteomyelitis, unspecified: Secondary | ICD-10-CM | POA: Diagnosis not present

## 2019-02-01 DIAGNOSIS — A4901 Methicillin susceptible Staphylococcus aureus infection, unspecified site: Secondary | ICD-10-CM | POA: Diagnosis not present

## 2019-02-04 DIAGNOSIS — Z9889 Other specified postprocedural states: Secondary | ICD-10-CM | POA: Diagnosis not present

## 2019-02-05 ENCOUNTER — Encounter (HOSPITAL_COMMUNITY)
Admission: RE | Admit: 2019-02-05 | Discharge: 2019-02-05 | Disposition: A | Discharge status: Home / Self Care | Payer: PPO | Source: Ambulatory Visit | Attending: Internal Medicine | Admitting: Internal Medicine

## 2019-02-05 ENCOUNTER — Telehealth: Payer: Self-pay | Admitting: *Deleted

## 2019-02-05 ENCOUNTER — Other Ambulatory Visit: Payer: Self-pay

## 2019-02-05 DIAGNOSIS — M6588 Other synovitis and tenosynovitis, other site: Secondary | ICD-10-CM | POA: Diagnosis not present

## 2019-02-05 LAB — CBC WITH DIFFERENTIAL/PLATELET
Abs Immature Granulocytes: 0.01 10*3/uL (ref 0.00–0.07)
Basophils Absolute: 0 10*3/uL (ref 0.0–0.1)
Basophils Relative: 0 %
Eosinophils Absolute: 0.2 10*3/uL (ref 0.0–0.5)
Eosinophils Relative: 3 %
HCT: 34.9 % — ABNORMAL LOW (ref 39.0–52.0)
Hemoglobin: 10.4 g/dL — ABNORMAL LOW (ref 13.0–17.0)
Immature Granulocytes: 0 %
Lymphocytes Relative: 11 %
Lymphs Abs: 0.6 10*3/uL — ABNORMAL LOW (ref 0.7–4.0)
MCH: 27.4 pg (ref 26.0–34.0)
MCHC: 29.8 g/dL — ABNORMAL LOW (ref 30.0–36.0)
MCV: 92.1 fL (ref 80.0–100.0)
Monocytes Absolute: 0.5 10*3/uL (ref 0.1–1.0)
Monocytes Relative: 9 %
Neutro Abs: 4.5 10*3/uL (ref 1.7–7.7)
Neutrophils Relative %: 77 %
Platelets: 196 10*3/uL (ref 150–400)
RBC: 3.79 MIL/uL — ABNORMAL LOW (ref 4.22–5.81)
RDW: 15.9 % — ABNORMAL HIGH (ref 11.5–15.5)
WBC: 5.9 10*3/uL (ref 4.0–10.5)
nRBC: 0 % (ref 0.0–0.2)

## 2019-02-05 LAB — BASIC METABOLIC PANEL
Anion gap: 9 (ref 5–15)
BUN: 18 mg/dL (ref 8–23)
CO2: 26 mmol/L (ref 22–32)
Calcium: 8.2 mg/dL — ABNORMAL LOW (ref 8.9–10.3)
Chloride: 101 mmol/L (ref 98–111)
Creatinine, Ser: 1.08 mg/dL (ref 0.61–1.24)
GFR calc Af Amer: >60 mL/min (ref >60)
GFR calc non Af Amer: >60 mL/min (ref >60)
Glucose, Bld: 209 mg/dL — ABNORMAL HIGH (ref 70–99)
Potassium: 4.3 mmol/L (ref 3.5–5.1)
Sodium: 136 mmol/L (ref 135–145)

## 2019-02-05 MED ORDER — HEPARIN SOD (PORK) LOCK FLUSH 100 UNIT/ML IV SOLN
500.0000 [IU] | Freq: Once | INTRAVENOUS | Status: AC
  Administered 2019-02-05: 250 [IU] via INTRAVENOUS

## 2019-02-05 MED ORDER — SODIUM CHLORIDE 0.9% FLUSH
10.0000 mL | INTRAVENOUS | Status: DC
  Administered 2019-02-05: 11:00:00 10 mL via INTRAVENOUS

## 2019-02-05 NOTE — Telephone Encounter (Signed)
Patient left message in triage asking for call back regarding question about his PICC.  RN returned the call, left voicemail asking him to call back when he gets a chance. Landis Gandy, RN

## 2019-02-08 DIAGNOSIS — A4901 Methicillin susceptible Staphylococcus aureus infection, unspecified site: Secondary | ICD-10-CM | POA: Diagnosis not present

## 2019-02-08 DIAGNOSIS — M869 Osteomyelitis, unspecified: Secondary | ICD-10-CM | POA: Diagnosis not present

## 2019-02-12 ENCOUNTER — Other Ambulatory Visit: Payer: Self-pay

## 2019-02-12 ENCOUNTER — Encounter (HOSPITAL_COMMUNITY): Payer: PPO

## 2019-02-12 ENCOUNTER — Encounter (HOSPITAL_COMMUNITY)
Admission: RE | Admit: 2019-02-12 | Discharge: 2019-02-12 | Disposition: A | Discharge status: Home / Self Care | Payer: PPO | Source: Ambulatory Visit | Attending: Internal Medicine | Admitting: Internal Medicine

## 2019-02-12 DIAGNOSIS — M6588 Other synovitis and tenosynovitis, other site: Secondary | ICD-10-CM | POA: Diagnosis not present

## 2019-02-12 LAB — CBC WITH DIFFERENTIAL/PLATELET
Abs Immature Granulocytes: 0.01 10*3/uL (ref 0.00–0.07)
Basophils Absolute: 0 10*3/uL (ref 0.0–0.1)
Basophils Relative: 0 %
Eosinophils Absolute: 0.1 10*3/uL (ref 0.0–0.5)
Eosinophils Relative: 2 %
HCT: 36.2 % — ABNORMAL LOW (ref 39.0–52.0)
Hemoglobin: 11.1 g/dL — ABNORMAL LOW (ref 13.0–17.0)
Immature Granulocytes: 0 %
Lymphocytes Relative: 12 %
Lymphs Abs: 0.7 10*3/uL (ref 0.7–4.0)
MCH: 27.8 pg (ref 26.0–34.0)
MCHC: 30.7 g/dL (ref 30.0–36.0)
MCV: 90.7 fL (ref 80.0–100.0)
Monocytes Absolute: 0.5 10*3/uL (ref 0.1–1.0)
Monocytes Relative: 9 %
Neutro Abs: 4.6 10*3/uL (ref 1.7–7.7)
Neutrophils Relative %: 77 %
Platelets: 222 10*3/uL (ref 150–400)
RBC: 3.99 MIL/uL — ABNORMAL LOW (ref 4.22–5.81)
RDW: 15.9 % — ABNORMAL HIGH (ref 11.5–15.5)
WBC: 6.1 10*3/uL (ref 4.0–10.5)
nRBC: 0 % (ref 0.0–0.2)

## 2019-02-12 LAB — BASIC METABOLIC PANEL
Anion gap: 12 (ref 5–15)
BUN: 15 mg/dL (ref 8–23)
CO2: 24 mmol/L (ref 22–32)
Calcium: 8.6 mg/dL — ABNORMAL LOW (ref 8.9–10.3)
Chloride: 102 mmol/L (ref 98–111)
Creatinine, Ser: 0.92 mg/dL (ref 0.61–1.24)
GFR calc Af Amer: >60 mL/min (ref >60)
GFR calc non Af Amer: >60 mL/min (ref >60)
Glucose, Bld: 104 mg/dL — ABNORMAL HIGH (ref 70–99)
Potassium: 4.2 mmol/L (ref 3.5–5.1)
Sodium: 138 mmol/L (ref 135–145)

## 2019-02-12 LAB — C-REACTIVE PROTEIN: CRP: <0.8 mg/dL (ref <1.0)

## 2019-02-12 LAB — SEDIMENTATION RATE: Sed Rate: 57 mm/hr — ABNORMAL HIGH (ref 0–16)

## 2019-03-05 DIAGNOSIS — G4733 Obstructive sleep apnea (adult) (pediatric): Secondary | ICD-10-CM | POA: Diagnosis not present

## 2019-03-05 DIAGNOSIS — I10 Essential (primary) hypertension: Secondary | ICD-10-CM | POA: Diagnosis not present

## 2019-03-05 DIAGNOSIS — E1165 Type 2 diabetes mellitus with hyperglycemia: Secondary | ICD-10-CM | POA: Diagnosis not present

## 2019-03-06 DIAGNOSIS — E1165 Type 2 diabetes mellitus with hyperglycemia: Secondary | ICD-10-CM | POA: Diagnosis not present

## 2019-03-06 DIAGNOSIS — I10 Essential (primary) hypertension: Secondary | ICD-10-CM | POA: Diagnosis not present

## 2019-03-07 ENCOUNTER — Other Ambulatory Visit (HOSPITAL_COMMUNITY): Payer: Self-pay | Admitting: Pulmonary Disease

## 2019-03-07 ENCOUNTER — Other Ambulatory Visit: Payer: Self-pay

## 2019-03-07 ENCOUNTER — Ambulatory Visit (HOSPITAL_COMMUNITY)
Admission: RE | Admit: 2019-03-07 | Discharge: 2019-03-07 | Disposition: A | Discharge status: Home / Self Care | Payer: PPO | Source: Ambulatory Visit | Attending: Pulmonary Disease | Admitting: Pulmonary Disease

## 2019-03-07 DIAGNOSIS — S61203A Unspecified open wound of left middle finger without damage to nail, initial encounter: Secondary | ICD-10-CM | POA: Diagnosis not present

## 2019-03-07 DIAGNOSIS — M7989 Other specified soft tissue disorders: Secondary | ICD-10-CM | POA: Diagnosis not present

## 2019-03-07 DIAGNOSIS — M79661 Pain in right lower leg: Secondary | ICD-10-CM | POA: Diagnosis not present

## 2019-03-07 DIAGNOSIS — M2540 Effusion, unspecified joint: Secondary | ICD-10-CM | POA: Insufficient documentation

## 2019-03-10 ENCOUNTER — Telehealth: Payer: Self-pay | Admitting: *Deleted

## 2019-03-10 NOTE — Telephone Encounter (Signed)
Dr Luan Pulling left message in triage asking to speak with Dr Hubert Azure, Lanice Schwab, RN

## 2019-03-10 NOTE — Telephone Encounter (Signed)
I have asked him to refer patient to orthopedic hand surgery. Is there any way anyone can also see the patient this week? I can try to work in post [employer].

## 2019-03-12 NOTE — Telephone Encounter (Signed)
No openings this week

## 2019-03-12 NOTE — Telephone Encounter (Signed)
Ronald Heath contacted the patient, bumped him up to 1/6 at 11:15.

## 2019-03-12 NOTE — Telephone Encounter (Signed)
Well lets schedule him for after the holidays. Hopefully the surgeon can admit him and do surgery and we can see him then. I dont have something magical I can do remotely other than we could restart him on oral antibiotics. IF so lets send a month of the antibiotic he was DC on to him

## 2019-03-18 DIAGNOSIS — M25532 Pain in left wrist: Secondary | ICD-10-CM | POA: Diagnosis not present

## 2019-03-26 ENCOUNTER — Encounter: Payer: Self-pay | Admitting: Infectious Disease

## 2019-03-26 ENCOUNTER — Other Ambulatory Visit: Payer: Self-pay

## 2019-03-26 ENCOUNTER — Ambulatory Visit (INDEPENDENT_AMBULATORY_CARE_PROVIDER_SITE_OTHER): Payer: PPO | Admitting: Infectious Disease

## 2019-03-26 VITALS — BP 166/74 | HR 67 | Temp 98.4°F | Ht 73.0 in | Wt >= 6400 oz

## 2019-03-26 DIAGNOSIS — M869 Osteomyelitis, unspecified: Secondary | ICD-10-CM

## 2019-03-26 DIAGNOSIS — E119 Type 2 diabetes mellitus without complications: Secondary | ICD-10-CM | POA: Diagnosis not present

## 2019-03-26 DIAGNOSIS — A4901 Methicillin susceptible Staphylococcus aureus infection, unspecified site: Secondary | ICD-10-CM | POA: Diagnosis not present

## 2019-03-26 DIAGNOSIS — L089 Local infection of the skin and subcutaneous tissue, unspecified: Secondary | ICD-10-CM

## 2019-03-26 DIAGNOSIS — E1165 Type 2 diabetes mellitus with hyperglycemia: Secondary | ICD-10-CM | POA: Diagnosis not present

## 2019-03-26 DIAGNOSIS — Z794 Long term (current) use of insulin: Secondary | ICD-10-CM | POA: Diagnosis not present

## 2019-03-26 DIAGNOSIS — E782 Mixed hyperlipidemia: Secondary | ICD-10-CM | POA: Diagnosis not present

## 2019-03-26 MED ORDER — AMOXICILLIN-POT CLAVULANATE 875-125 MG PO TABS: 1.0000 | ORAL_TABLET | Freq: Two times a day (BID) | ORAL | 5 refills | Status: DC

## 2019-03-26 NOTE — Progress Notes (Signed)
Chief complaint worsening of lesion on his second finger of the hand  Subjective:    Patient ID: Ronald Heath, male    DOB: 05-19-1945, 74 y.o.   MRN: [id number]  HPI   74 y.o. male with significant PMH of diabetes mellitus, HTN, hyperlipidemia, hypothyroidism, depression, and OSA who presented for a left index finger infection. 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   . Blood culture from 10/12 with no growth to date. Left finger wound culture growing Staph aureus and Eikenella corrodens on culture  He received ceftriaxone and metronidazole for a month.  He was initially seen Dr. Grandville Silos in follow-up was quite happy with his progress.   However since I saw him last he has had worsening of the second finger where he also had a burn that he has been apparently is picking at that area as well.  He also apparently fell on appears to have sustained a fracture in his left wrist is now wearing a cast.  After him seeing Dr. Grandville Silos again he was seen by Dr. Luan Pulling was concerned about infection in this finger and ordered plain films which are read as showing osteolysis of the tuft of the third distal phalanx concerning for Osteomyelitis.  When I spoke to Dr. Grandville Silos over the phone he did not think that there is indication for debridement of this finger but he did want to keep the patient on some antibiotics and so we initiated Augmentin which the patient is been taking since then.  I have asked him not to pick at any of these wounds anymore as this certainly can lead to for further repeat recurrent infections.  I am worried if he really has osteomyelitis of this finger if it is going to be really able to be healed absent another surgical intervention.  Past Medical History:  Diagnosis Date  . Depression   . Diabetes mellitus without complication ([place])   . Finger infection 12/31/2018  . Hyperlipidemia   . Hypertension   .  Hypothyroidism   . Shortness of breath    with exertion  . Sleep apnea    pt is supposed to wear CPAP, but doesn't wear it    Past Surgical History:  Procedure Laterality Date  . AMPUTATION Left 01/01/2019   Procedure: AMPUTATION LEFT INDEX FINGER;  Surgeon: [name], MD;  Location: [facility];  Service: Orthopedics;  Laterality: Left;  . CATARACT EXTRACTION W/PHACO Right 11/21/2012   Procedure: CATARACT EXTRACTION PHACO AND INTRAOCULAR LENS PLACEMENT (IOC);  Surgeon: Tonny Branch, MD;  Location: [facility];  Service: Ophthalmology;  Laterality: Right;  CDE:19.67  . CATARACT EXTRACTION W/PHACO Left 12/19/2012   Procedure: CATARACT EXTRACTION PHACO AND INTRAOCULAR LENS PLACEMENT (IOC);  Surgeon: Tonny Branch, MD;  Location: [facility];  Service: Ophthalmology;  Laterality: Left;  CDE:8.00  . KNEE ARTHROSCOPY Left   . ROTATOR CUFF REPAIR Right     Family History  Problem Relation Age of Onset  . Diabetes Mellitus II Father   . Stroke Father       Social History   Socioeconomic History  . Marital status: Widowed    Spouse name: Not on file  . Number of children: Not on file  . Years of education: Not on file  . Highest education level: Not on file  Occupational History  . Not on file  Tobacco Use  . Smoking status: Former Smoker    Packs/day: 2.00  Years: 15.00    Pack years: 30.00    Types: Cigarettes  . Smokeless tobacco: Never Used  Substance and Sexual Activity  . Alcohol use: Yes    Alcohol/week: 2.0 standard drinks    Types: 2 Cans of beer per week  . Drug use: No  . Sexual activity: Not on file  Other Topics Concern  . Not on file  Social History Narrative  . Not on file   Social Determinants of Health   Financial Resource Strain:   . Difficulty of Paying Living Expenses: Not on file  Food Insecurity:   . Worried About Charity fundraiser in the Last Year: Not on file  . Ran Out of Food in the Last Year: Not on file  Transportation Needs:   . Lack of  Transportation (Medical): Not on file  . Lack of Transportation (Non-Medical): Not on file  Physical Activity:   . Days of Exercise per Week: Not on file  . Minutes of Exercise per Session: Not on file  Stress:   . Feeling of Stress : Not on file  Social Connections:   . Frequency of Communication with Friends and Family: Not on file  . Frequency of Social Gatherings with Friends and Family: Not on file  . Attends Religious Services: Not on file  . Active Member of Clubs or Organizations: Not on file  . Attends Archivist Meetings: Not on file  . Marital Status: Not on file    No Known Allergies   Current Outpatient Medications:  .  amLODipine (NORVASC) 10 MG tablet, Take 5 mg by mouth daily. , Disp: , Rfl:  .  aspirin EC 81 MG tablet, Take 81 mg by mouth daily., Disp: , Rfl:  .  atorvastatin (LIPITOR) 20 MG tablet, Take 20 mg by mouth daily., Disp: , Rfl:  .  glipiZIDE (GLUCOTROL XL) 10 MG 24 hr tablet, Take 10 mg by mouth daily., Disp: , Rfl:  .  hydrALAZINE (APRESOLINE) 25 MG tablet, Take 1 tablet (25 mg total) by mouth 3 (three) times daily., Disp: 90 tablet, Rfl: 0 .  insulin glargine (LANTUS) 100 UNIT/ML injection, Inject 50 Units into the skin at bedtime., Disp: , Rfl:  .  insulin lispro (HUMALOG) 100 UNIT/ML injection, Inject 15 Units into the skin 3 (three) times daily after meals. , Disp: , Rfl:  .  levothyroxine (SYNTHROID, LEVOTHROID) 137 MCG tablet, Take 137 mcg by mouth daily before breakfast., Disp: , Rfl:  .  metFORMIN (GLUCOPHAGE) 1000 MG tablet, Take 1,000 mg by mouth 2 (two) times daily with a meal., Disp: , Rfl:  .  mometasone (NASONEX) 50 MCG/ACT nasal spray, Place 2 sprays into the nose daily as needed (for seasonal allergies). , Disp: , Rfl:  .  Naphazoline-Pheniramine (OPCON-A) 0.027-0.315 % SOLN, Place 1-2 drops into both eyes as needed (for itching)., Disp: , Rfl:  .  PARoxetine (PAXIL) 40 MG tablet, Take 20 mg by mouth every morning., Disp: , Rfl:    .  pioglitazone (ACTOS) 30 MG tablet, Take 15 mg by mouth daily. , Disp: , Rfl:  .  pramipexole (MIRAPEX) 1 MG tablet, Take 1 mg by mouth at bedtime., Disp: , Rfl:  .  Semaglutide,0.25 or 0.5MG /DOS, (OZEMPIC, 0.25 OR 0.5 MG/DOSE,) 2 MG/1.5ML SOPN, Inject 0.5 mg into the skin every Wednesday., Disp: , Rfl:  .  vitamin B-12 (CYANOCOBALAMIN) 1000 MCG tablet, Take 1,000 mcg by mouth daily., Disp: , Rfl:    Past Medical History:  Diagnosis Date  . Depression   . Diabetes mellitus without complication ([place])   . Finger infection 12/31/2018  . Hyperlipidemia   . Hypertension   . Hypothyroidism   . Shortness of breath    with exertion  . Sleep apnea    pt is supposed to wear CPAP, but doesn't wear it    Past Surgical History:  Procedure Laterality Date  . AMPUTATION Left 01/01/2019   Procedure: AMPUTATION LEFT INDEX FINGER;  Surgeon: [name], MD;  Location: [facility];  Service: Orthopedics;  Laterality: Left;  . CATARACT EXTRACTION W/PHACO Right 11/21/2012   Procedure: CATARACT EXTRACTION PHACO AND INTRAOCULAR LENS PLACEMENT (IOC);  Surgeon: Tonny Branch, MD;  Location: [facility];  Service: Ophthalmology;  Laterality: Right;  CDE:19.67  . CATARACT EXTRACTION W/PHACO Left 12/19/2012   Procedure: CATARACT EXTRACTION PHACO AND INTRAOCULAR LENS PLACEMENT (IOC);  Surgeon: Tonny Branch, MD;  Location: [facility];  Service: Ophthalmology;  Laterality: Left;  CDE:8.00  . KNEE ARTHROSCOPY Left   . ROTATOR CUFF REPAIR Right     Family History  Problem Relation Age of Onset  . Diabetes Mellitus II Father   . Stroke Father       Social History   Socioeconomic History  . Marital status: Widowed    Spouse name: Not on file  . Number of children: Not on file  . Years of education: Not on file  . Highest education level: Not on file  Occupational History  . Not on file  Tobacco Use  . Smoking status: Former Smoker    Packs/day: 2.00    Years: 15.00    Pack years: 30.00    Types: Cigarettes  .  Smokeless tobacco: Never Used  Substance and Sexual Activity  . Alcohol use: Yes    Alcohol/week: 2.0 standard drinks    Types: 2 Cans of beer per week  . Drug use: No  . Sexual activity: Not on file  Other Topics Concern  . Not on file  Social History Narrative  . Not on file   Social Determinants of Health   Financial Resource Strain:   . Difficulty of Paying Living Expenses: Not on file  Food Insecurity:   . Worried About Charity fundraiser in the Last Year: Not on file  . Ran Out of Food in the Last Year: Not on file  Transportation Needs:   . Lack of Transportation (Medical): Not on file  . Lack of Transportation (Non-Medical): Not on file  Physical Activity:   . Days of Exercise per Week: Not on file  . Minutes of Exercise per Session: Not on file  Stress:   . Feeling of Stress : Not on file  Social Connections:   . Frequency of Communication with Friends and Family: Not on file  . Frequency of Social Gatherings with Friends and Family: Not on file  . Attends Religious Services: Not on file  . Active Member of Clubs or Organizations: Not on file  . Attends Archivist Meetings: Not on file  . Marital Status: Not on file    No Known Allergies   Current Outpatient Medications:  .  amLODipine (NORVASC) 10 MG tablet, Take 5 mg by mouth daily. , Disp: , Rfl:  .  aspirin EC 81 MG tablet, Take 81 mg by mouth daily., Disp: , Rfl:  .  atorvastatin (LIPITOR) 20 MG tablet, Take 20 mg by mouth daily., Disp: , Rfl:  .  glipiZIDE (GLUCOTROL XL) 10 MG 24  hr tablet, Take 10 mg by mouth daily., Disp: , Rfl:  .  hydrALAZINE (APRESOLINE) 25 MG tablet, Take 1 tablet (25 mg total) by mouth 3 (three) times daily., Disp: 90 tablet, Rfl: 0 .  insulin glargine (LANTUS) 100 UNIT/ML injection, Inject 50 Units into the skin at bedtime., Disp: , Rfl:  .  insulin lispro (HUMALOG) 100 UNIT/ML injection, Inject 15 Units into the skin 3 (three) times daily after meals. , Disp: , Rfl:  .   levothyroxine (SYNTHROID, LEVOTHROID) 137 MCG tablet, Take 137 mcg by mouth daily before breakfast., Disp: , Rfl:  .  metFORMIN (GLUCOPHAGE) 1000 MG tablet, Take 1,000 mg by mouth 2 (two) times daily with a meal., Disp: , Rfl:  .  mometasone (NASONEX) 50 MCG/ACT nasal spray, Place 2 sprays into the nose daily as needed (for seasonal allergies). , Disp: , Rfl:  .  Naphazoline-Pheniramine (OPCON-A) 0.027-0.315 % SOLN, Place 1-2 drops into both eyes as needed (for itching)., Disp: , Rfl:  .  PARoxetine (PAXIL) 40 MG tablet, Take 20 mg by mouth every morning., Disp: , Rfl:  .  pioglitazone (ACTOS) 30 MG tablet, Take 15 mg by mouth daily. , Disp: , Rfl:  .  pramipexole (MIRAPEX) 1 MG tablet, Take 1 mg by mouth at bedtime., Disp: , Rfl:  .  Semaglutide,0.25 or 0.5MG /DOS, (OZEMPIC, 0.25 OR 0.5 MG/DOSE,) 2 MG/1.5ML SOPN, Inject 0.5 mg into the skin every Wednesday., Disp: , Rfl:  .  vitamin B-12 (CYANOCOBALAMIN) 1000 MCG tablet, Take 1,000 mcg by mouth daily., Disp: , Rfl:    Review of Systems  Constitutional: Negative for chills and fever.  HENT: Negative for congestion and sore throat.   Eyes: Negative for photophobia.  Respiratory: Negative for cough, shortness of breath and wheezing.   Cardiovascular: Negative for chest pain, palpitations and leg swelling.  Gastrointestinal: Negative for abdominal pain, blood in stool, constipation, diarrhea, nausea and vomiting.  Genitourinary: Negative for dysuria, flank pain and hematuria.  Musculoskeletal: Positive for arthralgias. Negative for back pain and myalgias.  Skin: Positive for color change and wound. Negative for rash.  Neurological: Negative for dizziness, weakness and headaches.  Hematological: Does not bruise/bleed easily.  Psychiatric/Behavioral: Negative for suicidal ideas.       Objective:   Physical Exam Constitutional:      General: He is not in acute distress.    Appearance: Normal appearance. He is well-developed. He is not  ill-appearing or diaphoretic.  HENT:     Head: Normocephalic and atraumatic.     Right Ear: Hearing and external ear normal.     Left Ear: Hearing and external ear normal.     Nose: No nasal deformity or rhinorrhea.  Eyes:     General: No scleral icterus.    Conjunctiva/sclera: Conjunctivae normal.     Right eye: Right conjunctiva is not injected.     Left eye: Left conjunctiva is not injected.     Pupils: Pupils are equal, round, and reactive to light.  Neck:     Vascular: No JVD.  Cardiovascular:     Rate and Rhythm: Normal rate and regular rhythm.     Heart sounds: S1 normal and S2 normal.  Abdominal:     General: There is no distension.     Palpations: Abdomen is soft.  Musculoskeletal:        General: Normal range of motion.     Right shoulder: Normal.     Left shoulder: Normal.     Cervical back:  Normal range of motion and neck supple.     Right hip: Normal.     Left hip: Normal.     Right knee: Normal.     Left knee: Normal.  Lymphadenopathy:     Head:     Right side of head: No submandibular, preauricular or posterior auricular adenopathy.     Left side of head: No submandibular, preauricular or posterior auricular adenopathy.     Cervical: No cervical adenopathy.     Right cervical: No superficial or deep cervical adenopathy.    Left cervical: No superficial or deep cervical adenopathy.  Skin:    General: Skin is warm and dry.     Coloration: Skin is not pale.     Findings: No abrasion, bruising, ecchymosis, erythema, lesion or rash.     Nails: There is no clubbing.  Neurological:     General: No focal deficit present.     Mental Status: He is alert and oriented to person, place, and time.     Sensory: No sensory deficit.     Coordination: Coordination normal.     Gait: Gait normal.  Psychiatric:        Attention and Perception: He is attentive.        Mood and Affect: Mood normal.        Speech: Speech normal.        Behavior: Behavior normal. Behavior is  cooperative.        Thought Content: Thought content normal.        Judgment: Judgment normal.      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 Grandville Silos.  He has been started on Augmentin I will continue this I will reassess him in roughly 1 month's time we will recheck a sed rate and CRP today along with a CBC and BMP.   Left wrist fracture in cast being followed by Dr. Grandville Silos

## 2019-03-31 ENCOUNTER — Ambulatory Visit: Payer: PPO | Admitting: Infectious Disease

## 2019-04-02 DIAGNOSIS — E113393 Type 2 diabetes mellitus with moderate nonproliferative diabetic retinopathy without macular edema, bilateral: Secondary | ICD-10-CM | POA: Diagnosis not present

## 2019-04-02 DIAGNOSIS — E1142 Type 2 diabetes mellitus with diabetic polyneuropathy: Secondary | ICD-10-CM | POA: Diagnosis not present

## 2019-04-02 DIAGNOSIS — E1165 Type 2 diabetes mellitus with hyperglycemia: Secondary | ICD-10-CM | POA: Diagnosis not present

## 2019-04-02 DIAGNOSIS — Z794 Long term (current) use of insulin: Secondary | ICD-10-CM | POA: Diagnosis not present

## 2019-04-02 DIAGNOSIS — E039 Hypothyroidism, unspecified: Secondary | ICD-10-CM | POA: Diagnosis not present

## 2019-04-02 DIAGNOSIS — E538 Deficiency of other specified B group vitamins: Secondary | ICD-10-CM | POA: Diagnosis not present

## 2019-04-02 DIAGNOSIS — E782 Mixed hyperlipidemia: Secondary | ICD-10-CM | POA: Diagnosis not present

## 2019-04-02 DIAGNOSIS — I10 Essential (primary) hypertension: Secondary | ICD-10-CM | POA: Diagnosis not present

## 2019-04-10 DIAGNOSIS — E1165 Type 2 diabetes mellitus with hyperglycemia: Secondary | ICD-10-CM | POA: Diagnosis not present

## 2019-04-10 DIAGNOSIS — E039 Hypothyroidism, unspecified: Secondary | ICD-10-CM | POA: Diagnosis not present

## 2019-04-10 DIAGNOSIS — D519 Vitamin B12 deficiency anemia, unspecified: Secondary | ICD-10-CM | POA: Diagnosis not present

## 2019-04-10 DIAGNOSIS — G2581 Restless legs syndrome: Secondary | ICD-10-CM | POA: Diagnosis not present

## 2019-04-10 DIAGNOSIS — F331 Major depressive disorder, recurrent, moderate: Secondary | ICD-10-CM | POA: Diagnosis not present

## 2019-04-10 DIAGNOSIS — L089 Local infection of the skin and subcutaneous tissue, unspecified: Secondary | ICD-10-CM | POA: Diagnosis not present

## 2019-04-10 DIAGNOSIS — I10 Essential (primary) hypertension: Secondary | ICD-10-CM | POA: Diagnosis not present

## 2019-04-10 DIAGNOSIS — E785 Hyperlipidemia, unspecified: Secondary | ICD-10-CM | POA: Diagnosis not present

## 2019-04-15 DIAGNOSIS — M869 Osteomyelitis, unspecified: Secondary | ICD-10-CM | POA: Diagnosis not present

## 2019-04-15 DIAGNOSIS — S62025A Nondisplaced fracture of middle third of navicular [scaphoid] bone of left wrist, initial encounter for closed fracture: Secondary | ICD-10-CM | POA: Diagnosis not present

## 2019-04-16 ENCOUNTER — Telehealth: Payer: Self-pay

## 2019-04-16 NOTE — Telephone Encounter (Signed)
Thank you. Patient made aware. Would you like him to continue antibiotics through his next visit. Patient states he will be out in a few days.

## 2019-04-16 NOTE — Telephone Encounter (Signed)
Patient voiced that his daughter is a Marine scientist and has mentioned to him since he has not had a "break" in between anitbiotics then he should request flagyl to prevent him from having C-diff.  LPN asked patient is he taking any probiotics, patient states he has recently started taking them but would also like Dr. Derek Mound opinion. Routing to MD for advise. Eugenia Mcalpine

## 2019-04-16 NOTE — Telephone Encounter (Signed)
We did not take breaks when we were treating bone infections that means more surgery and more hospitalization which I am sure the patient will not want.  Giving Flagyl as not been have effective at preventing C. difficile giving probiotics is also not effective it preventing C. difficile.  In fact probiotics have a risk more so in immunocompromised patients of actually causing bacteremias and fungemia is.  Once we get him through his treatment for his bone infection I will be happy for him to not be on antibiotics but he also needs not to pick at these wounds which apparently has been going on

## 2019-04-17 NOTE — Telephone Encounter (Signed)
I would like him to continue until he comes to see me

## 2019-04-17 NOTE — Telephone Encounter (Signed)
Called patient and left VM to continue antibiotics and refills are available at his pharmacy.  Eugenia Mcalpine

## 2019-05-05 ENCOUNTER — Ambulatory Visit: Payer: PPO | Admitting: Infectious Disease

## 2019-05-13 ENCOUNTER — Ambulatory Visit: Payer: PPO | Admitting: Infectious Disease

## 2019-05-16 DIAGNOSIS — H31091 Other chorioretinal scars, right eye: Secondary | ICD-10-CM | POA: Diagnosis not present

## 2019-05-16 DIAGNOSIS — H26491 Other secondary cataract, right eye: Secondary | ICD-10-CM | POA: Diagnosis not present

## 2019-05-16 DIAGNOSIS — E113313 Type 2 diabetes mellitus with moderate nonproliferative diabetic retinopathy with macular edema, bilateral: Secondary | ICD-10-CM | POA: Diagnosis not present

## 2019-05-16 DIAGNOSIS — H43813 Vitreous degeneration, bilateral: Secondary | ICD-10-CM | POA: Diagnosis not present

## 2019-05-20 ENCOUNTER — Telehealth: Payer: Self-pay | Admitting: *Deleted

## 2019-05-20 DIAGNOSIS — M869 Osteomyelitis, unspecified: Secondary | ICD-10-CM | POA: Diagnosis not present

## 2019-05-20 DIAGNOSIS — S62025A Nondisplaced fracture of middle third of navicular [scaphoid] bone of left wrist, initial encounter for closed fracture: Secondary | ICD-10-CM | POA: Diagnosis not present

## 2019-05-20 NOTE — Telephone Encounter (Signed)
Patient left message asking if he needed to refill his Augmentin or if he was done.  He follows up with Dr Tommy Medal on 3/17, has enough medication for a few more days.  Per Chart, Dr Tommy Medal sent 6 month supply of augmentin to [facility] in January, asked him to continue the medication until he is seen. RN returned the call, left message asking patient to contact the pharmacy for his refill, call us if there is an issue. Landis Gandy, RN

## 2019-06-04 ENCOUNTER — Ambulatory Visit (INDEPENDENT_AMBULATORY_CARE_PROVIDER_SITE_OTHER): Payer: PPO | Admitting: Infectious Disease

## 2019-06-04 ENCOUNTER — Encounter: Payer: Self-pay | Admitting: Infectious Disease

## 2019-06-04 ENCOUNTER — Other Ambulatory Visit: Payer: Self-pay

## 2019-06-04 VITALS — BP 180/83 | HR 74 | Temp 98.0°F | Ht 73.0 in | Wt >= 6400 oz

## 2019-06-04 DIAGNOSIS — A4901 Methicillin susceptible Staphylococcus aureus infection, unspecified site: Secondary | ICD-10-CM | POA: Diagnosis not present

## 2019-06-04 DIAGNOSIS — M869 Osteomyelitis, unspecified: Secondary | ICD-10-CM

## 2019-06-04 DIAGNOSIS — L089 Local infection of the skin and subcutaneous tissue, unspecified: Secondary | ICD-10-CM

## 2019-06-04 DIAGNOSIS — E119 Type 2 diabetes mellitus without complications: Secondary | ICD-10-CM

## 2019-06-04 NOTE — Progress Notes (Signed)
Chief complaint : Follow-up for osteomyelitis of finger  Subjective:    Patient ID: Ronald Heath, male    DOB: [birth year], 74 y.o.   MRN: [id number]  Hand Pain  Pertinent negatives include no chest pain.     74 y.o. male with significant PMH of diabetes mellitus, HTN, hyperlipidemia, hypothyroidism, depression, and OSA who presented for a left index finger infection. 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   . Blood culture from 10/12 with no growth to date. Left finger wound culture growing Staph aureus and Eikenella corrodens on culture  He received ceftriaxone and metronidazole for a month.  He was initially seen Dr. Grandville Silos in follow-up was quite happy with his progress.   However he then developed worsening of the second finger where he also had a burn that he has been apparently is picking at that area as well.  He also apparently fell on appears to have sustained a fracture in his left wrist is now wearing a cast.  After him seeing Dr. Grandville Silos again he was seen by Dr. Luan Pulling was concerned about infection in this finger and ordered plain films which are read as showing osteolysis of the tuft of the third distal phalanx concerning for Osteomyelitis.  When I initially spoke to Dr. Grandville Silos over the phone he did not think that there is indication for debridement of this finger but he did want to keep the patient on some antibiotics and so we initiated Augmentin which the patient is been taking since then.  He has continued on Augmentin since then and is finger looks improved on exam.  He did bring a CD-ROM with plain films on it but I could not load this on my computer and instead call for [employer].  Dr. Grandville Silos told me that the radiographs showed worsening cortical erosion despite him being on antibiotics and having an apparent clinical response based on his exam.    Past Medical History:  Diagnosis Date  . Depression    . Diabetes mellitus without complication ([place])   . Finger infection 12/31/2018  . Hyperlipidemia   . Hypertension   . Hypothyroidism   . Shortness of breath    with exertion  . Sleep apnea    pt is supposed to wear CPAP, but doesn't wear it    Past Surgical History:  Procedure Laterality Date  . AMPUTATION Left 01/01/2019   Procedure: AMPUTATION LEFT INDEX FINGER;  Surgeon: [name], MD;  Location: [facility];  Service: Orthopedics;  Laterality: Left;  . CATARACT EXTRACTION W/PHACO Right 11/21/2012   Procedure: CATARACT EXTRACTION PHACO AND INTRAOCULAR LENS PLACEMENT (IOC);  Surgeon: Tonny Branch, MD;  Location: [facility];  Service: Ophthalmology;  Laterality: Right;  CDE:19.67  . CATARACT EXTRACTION W/PHACO Left 12/19/2012   Procedure: CATARACT EXTRACTION PHACO AND INTRAOCULAR LENS PLACEMENT (IOC);  Surgeon: Tonny Branch, MD;  Location: [facility];  Service: Ophthalmology;  Laterality: Left;  CDE:8.00  . KNEE ARTHROSCOPY Left   . ROTATOR CUFF REPAIR Right     Family History  Problem Relation Age of Onset  . Diabetes Mellitus II Father   . Stroke Father       Social History   Socioeconomic History  . Marital status: Widowed    Spouse name: Not on file  . Number of children: Not on file  . Years of education: Not on file  . Highest education level: Not on file  Occupational  History  . Not on file  Tobacco Use  . Smoking status: Former Smoker    Packs/day: 2.00    Years: 15.00    Pack years: 30.00    Types: Cigarettes  . Smokeless tobacco: Never Used  Substance and Sexual Activity  . Alcohol use: Yes    Alcohol/week: 2.0 standard drinks    Types: 2 Cans of beer per week  . Drug use: No  . Sexual activity: Not on file  Other Topics Concern  . Not on file  Social History Narrative  . Not on file   Social Determinants of Health   Financial Resource Strain:   . Difficulty of Paying Living Expenses:   Food Insecurity:   . Worried About Charity fundraiser in the Last  Year:   . Arboriculturist in the Last Year:   Transportation Needs:   . Film/video editor (Medical):   Marland Kitchen Lack of Transportation (Non-Medical):   Physical Activity:   . Days of Exercise per Week:   . Minutes of Exercise per Session:   Stress:   . Feeling of Stress :   Social Connections:   . Frequency of Communication with Friends and Family:   . Frequency of Social Gatherings with Friends and Family:   . Attends Religious Services:   . Active Member of Clubs or Organizations:   . Attends Archivist Meetings:   Marland Kitchen Marital Status:     No Known Allergies   Current Outpatient Medications:  .  amLODipine (NORVASC) 10 MG tablet, Take 5 mg by mouth daily. , Disp: , Rfl:  .  amoxicillin-clavulanate (AUGMENTIN) 875-125 MG tablet, Take 1 tablet by mouth 2 (two) times daily., Disp: 60 tablet, Rfl: 5 .  aspirin EC 81 MG tablet, Take 81 mg by mouth daily., Disp: , Rfl:  .  atorvastatin (LIPITOR) 20 MG tablet, Take 20 mg by mouth daily., Disp: , Rfl:  .  glipiZIDE (GLUCOTROL XL) 10 MG 24 hr tablet, Take 10 mg by mouth daily., Disp: , Rfl:  .  hydrALAZINE (APRESOLINE) 25 MG tablet, Take 1 tablet (25 mg total) by mouth 3 (three) times daily., Disp: 90 tablet, Rfl: 0 .  insulin glargine (LANTUS) 100 UNIT/ML injection, Inject 50 Units into the skin at bedtime., Disp: , Rfl:  .  insulin lispro (HUMALOG) 100 UNIT/ML injection, Inject 15 Units into the skin 3 (three) times daily after meals. , Disp: , Rfl:  .  levothyroxine (SYNTHROID, LEVOTHROID) 137 MCG tablet, Take 137 mcg by mouth daily before breakfast., Disp: , Rfl:  .  losartan (COZAAR) 100 MG tablet, Take 100 mg by mouth daily. Taking half tablet daily., Disp: , Rfl:  .  metFORMIN (GLUCOPHAGE) 1000 MG tablet, Take 1,000 mg by mouth 2 (two) times daily with a meal., Disp: , Rfl:  .  mometasone (NASONEX) 50 MCG/ACT nasal spray, Place 2 sprays into the nose daily as needed (for seasonal allergies). , Disp: , Rfl:  .   Naphazoline-Pheniramine (OPCON-A) 0.027-0.315 % SOLN, Place 1-2 drops into both eyes as needed (for itching)., Disp: , Rfl:  .  PARoxetine (PAXIL) 40 MG tablet, Take 20 mg by mouth every morning., Disp: , Rfl:  .  pioglitazone (ACTOS) 30 MG tablet, Take 15 mg by mouth daily. , Disp: , Rfl:  .  pramipexole (MIRAPEX) 1 MG tablet, Take 1 mg by mouth at bedtime., Disp: , Rfl:  .  Semaglutide,0.25 or 0.5MG /DOS, (OZEMPIC, 0.25 OR 0.5 MG/DOSE,) 2 MG/1.5ML  SOPN, Inject 0.5 mg into the skin every Wednesday., Disp: , Rfl:  .  vitamin B-12 (CYANOCOBALAMIN) 1000 MCG tablet, Take 1,000 mcg by mouth daily., Disp: , Rfl:    Past Medical History:  Diagnosis Date  . Depression   . Diabetes mellitus without complication ([place])   . Finger infection 12/31/2018  . Hyperlipidemia   . Hypertension   . Hypothyroidism   . Shortness of breath    with exertion  . Sleep apnea    pt is supposed to wear CPAP, but doesn't wear it    Past Surgical History:  Procedure Laterality Date  . AMPUTATION Left 01/01/2019   Procedure: AMPUTATION LEFT INDEX FINGER;  Surgeon: [name], MD;  Location: [facility];  Service: Orthopedics;  Laterality: Left;  . CATARACT EXTRACTION W/PHACO Right 11/21/2012   Procedure: CATARACT EXTRACTION PHACO AND INTRAOCULAR LENS PLACEMENT (IOC);  Surgeon: Tonny Branch, MD;  Location: [facility];  Service: Ophthalmology;  Laterality: Right;  CDE:19.67  . CATARACT EXTRACTION W/PHACO Left 12/19/2012   Procedure: CATARACT EXTRACTION PHACO AND INTRAOCULAR LENS PLACEMENT (IOC);  Surgeon: Tonny Branch, MD;  Location: [facility];  Service: Ophthalmology;  Laterality: Left;  CDE:8.00  . KNEE ARTHROSCOPY Left   . ROTATOR CUFF REPAIR Right     Family History  Problem Relation Age of Onset  . Diabetes Mellitus II Father   . Stroke Father       Social History   Socioeconomic History  . Marital status: Widowed    Spouse name: Not on file  . Number of children: Not on file  . Years of education: Not on file  .  Highest education level: Not on file  Occupational History  . Not on file  Tobacco Use  . Smoking status: Former Smoker    Packs/day: 2.00    Years: 15.00    Pack years: 30.00    Types: Cigarettes  . Smokeless tobacco: Never Used  Substance and Sexual Activity  . Alcohol use: Yes    Alcohol/week: 2.0 standard drinks    Types: 2 Cans of beer per week  . Drug use: No  . Sexual activity: Not on file  Other Topics Concern  . Not on file  Social History Narrative  . Not on file   Social Determinants of Health   Financial Resource Strain:   . Difficulty of Paying Living Expenses:   Food Insecurity:   . Worried About Charity fundraiser in the Last Year:   . Arboriculturist in the Last Year:   Transportation Needs:   . Film/video editor (Medical):   Marland Kitchen Lack of Transportation (Non-Medical):   Physical Activity:   . Days of Exercise per Week:   . Minutes of Exercise per Session:   Stress:   . Feeling of Stress :   Social Connections:   . Frequency of Communication with Friends and Family:   . Frequency of Social Gatherings with Friends and Family:   . Attends Religious Services:   . Active Member of Clubs or Organizations:   . Attends Archivist Meetings:   Marland Kitchen Marital Status:     No Known Allergies   Current Outpatient Medications:  .  amLODipine (NORVASC) 10 MG tablet, Take 5 mg by mouth daily. , Disp: , Rfl:  .  amoxicillin-clavulanate (AUGMENTIN) 875-125 MG tablet, Take 1 tablet by mouth 2 (two) times daily., Disp: 60 tablet, Rfl: 5 .  aspirin EC 81 MG tablet, Take 81 mg by mouth  daily., Disp: , Rfl:  .  atorvastatin (LIPITOR) 20 MG tablet, Take 20 mg by mouth daily., Disp: , Rfl:  .  glipiZIDE (GLUCOTROL XL) 10 MG 24 hr tablet, Take 10 mg by mouth daily., Disp: , Rfl:  .  hydrALAZINE (APRESOLINE) 25 MG tablet, Take 1 tablet (25 mg total) by mouth 3 (three) times daily., Disp: 90 tablet, Rfl: 0 .  insulin glargine (LANTUS) 100 UNIT/ML injection, Inject 50  Units into the skin at bedtime., Disp: , Rfl:  .  insulin lispro (HUMALOG) 100 UNIT/ML injection, Inject 15 Units into the skin 3 (three) times daily after meals. , Disp: , Rfl:  .  levothyroxine (SYNTHROID, LEVOTHROID) 137 MCG tablet, Take 137 mcg by mouth daily before breakfast., Disp: , Rfl:  .  losartan (COZAAR) 100 MG tablet, Take 100 mg by mouth daily. Taking half tablet daily., Disp: , Rfl:  .  metFORMIN (GLUCOPHAGE) 1000 MG tablet, Take 1,000 mg by mouth 2 (two) times daily with a meal., Disp: , Rfl:  .  mometasone (NASONEX) 50 MCG/ACT nasal spray, Place 2 sprays into the nose daily as needed (for seasonal allergies). , Disp: , Rfl:  .  Naphazoline-Pheniramine (OPCON-A) 0.027-0.315 % SOLN, Place 1-2 drops into both eyes as needed (for itching)., Disp: , Rfl:  .  PARoxetine (PAXIL) 40 MG tablet, Take 20 mg by mouth every morning., Disp: , Rfl:  .  pioglitazone (ACTOS) 30 MG tablet, Take 15 mg by mouth daily. , Disp: , Rfl:  .  pramipexole (MIRAPEX) 1 MG tablet, Take 1 mg by mouth at bedtime., Disp: , Rfl:  .  Semaglutide,0.25 or 0.5MG /DOS, (OZEMPIC, 0.25 OR 0.5 MG/DOSE,) 2 MG/1.5ML SOPN, Inject 0.5 mg into the skin every Wednesday., Disp: , Rfl:  .  vitamin B-12 (CYANOCOBALAMIN) 1000 MCG tablet, Take 1,000 mcg by mouth daily., Disp: , Rfl:    Review of Systems  Constitutional: Negative for chills and fever.  HENT: Negative for congestion and sore throat.   Eyes: Negative for photophobia.  Respiratory: Negative for cough, shortness of breath and wheezing.   Cardiovascular: Negative for chest pain, palpitations and leg swelling.  Gastrointestinal: Negative for abdominal pain, blood in stool, constipation, diarrhea, nausea and vomiting.  Genitourinary: Negative for dysuria, flank pain and hematuria.  Musculoskeletal: Positive for arthralgias. Negative for back pain and myalgias.  Skin: Positive for color change and wound. Negative for rash.  Neurological: Negative for dizziness, weakness  and headaches.  Hematological: Does not bruise/bleed easily.  Psychiatric/Behavioral: Negative for suicidal ideas.       Objective:   Physical Exam Constitutional:      General: He is not in acute distress.    Appearance: Normal appearance. He is well-developed. He is not ill-appearing or diaphoretic.  HENT:     Head: Normocephalic and atraumatic.     Right Ear: Hearing and external ear normal.     Left Ear: Hearing and external ear normal.     Nose: No nasal deformity or rhinorrhea.  Eyes:     General: No scleral icterus.    Conjunctiva/sclera: Conjunctivae normal.     Right eye: Right conjunctiva is not injected.     Left eye: Left conjunctiva is not injected.     Pupils: Pupils are equal, round, and reactive to light.  Neck:     Vascular: No JVD.  Cardiovascular:     Rate and Rhythm: Normal rate and regular rhythm.     Heart sounds: S1 normal and S2 normal.  Abdominal:  General: There is no distension.     Palpations: Abdomen is soft.  Musculoskeletal:        General: Normal range of motion.     Right shoulder: Normal.     Left shoulder: Normal.     Cervical back: Normal range of motion and neck supple.     Right hip: Normal.     Left hip: Normal.     Right knee: Normal.     Left knee: Normal.  Lymphadenopathy:     Head:     Right side of head: No submandibular, preauricular or posterior auricular adenopathy.     Left side of head: No submandibular, preauricular or posterior auricular adenopathy.     Cervical: No cervical adenopathy.     Right cervical: No superficial or deep cervical adenopathy.    Left cervical: No superficial or deep cervical adenopathy.  Skin:    General: Skin is warm and dry.     Coloration: Skin is not pale.     Findings: No abrasion, bruising, ecchymosis, erythema, lesion or rash.     Nails: There is no clubbing.  Neurological:     General: No focal deficit present.     Mental Status: He is alert and oriented to person, place, and  time.     Sensory: No sensory deficit.     Coordination: Coordination normal.     Gait: Gait normal.  Psychiatric:        Attention and Perception: He is attentive.        Mood and Affect: Mood normal.        Speech: Speech normal.        Behavior: Behavior normal. Behavior is cooperative.        Thought Content: Thought content normal.        Judgment: Judgment normal.      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 Grandville Silos in roughly a month's time which blood point in time plain films will be repeated  Hopefully does not need a surgical intervention but perhaps he may I am checking inflammatory markers today and baseline labs as well.

## 2019-06-05 LAB — BASIC METABOLIC PANEL WITH GFR
BUN: 15 mg/dL (ref 7–25)
CO2: 29 mmol/L (ref 20–32)
Calcium: 8.8 mg/dL (ref 8.6–10.3)
Chloride: 102 mmol/L (ref 98–110)
Creat: 1.09 mg/dL (ref 0.70–1.18)
GFR, Est African American: 78 mL/min/{1.73_m2} (ref >=60)
GFR, Est Non African American: 67 mL/min/{1.73_m2} (ref >=60)
Glucose, Bld: 129 mg/dL — ABNORMAL HIGH (ref 65–99)
Potassium: 4.9 mmol/L (ref 3.5–5.3)
Sodium: 139 mmol/L (ref 135–146)

## 2019-06-05 LAB — SEDIMENTATION RATE: Sed Rate: 41 mm/h — ABNORMAL HIGH (ref 0–20)

## 2019-06-05 LAB — CBC WITH DIFFERENTIAL/PLATELET
Absolute Monocytes: 598 cells/uL (ref 200–950)
Basophils Absolute: 29 cells/uL (ref 0–200)
Basophils Relative: 0.4 %
Eosinophils Absolute: 122 cells/uL (ref 15–500)
Eosinophils Relative: 1.7 %
HCT: 35.5 % — ABNORMAL LOW (ref 38.5–50.0)
Hemoglobin: 11.4 g/dL — ABNORMAL LOW (ref 13.2–17.1)
Lymphs Abs: 871 cells/uL (ref 850–3900)
MCH: 27.3 pg (ref 27.0–33.0)
MCHC: 32.1 g/dL (ref 32.0–36.0)
MCV: 84.9 fL (ref 80.0–100.0)
MPV: 11.8 fL (ref 7.5–12.5)
Monocytes Relative: 8.3 %
Neutro Abs: 5580 cells/uL (ref 1500–7800)
Neutrophils Relative %: 77.5 %
Platelets: 201 10*3/uL (ref 140–400)
RBC: 4.18 10*6/uL — ABNORMAL LOW (ref 4.20–5.80)
RDW: 13.6 % (ref 11.0–15.0)
Total Lymphocyte: 12.1 %
WBC: 7.2 10*3/uL (ref 3.8–10.8)

## 2019-06-05 LAB — C-REACTIVE PROTEIN: CRP: 6.9 mg/L (ref <8.0)

## 2019-07-04 DIAGNOSIS — E039 Hypothyroidism, unspecified: Secondary | ICD-10-CM | POA: Diagnosis not present

## 2019-07-04 DIAGNOSIS — Z794 Long term (current) use of insulin: Secondary | ICD-10-CM | POA: Diagnosis not present

## 2019-07-04 DIAGNOSIS — E1165 Type 2 diabetes mellitus with hyperglycemia: Secondary | ICD-10-CM | POA: Diagnosis not present

## 2019-07-04 DIAGNOSIS — E782 Mixed hyperlipidemia: Secondary | ICD-10-CM | POA: Diagnosis not present

## 2019-07-07 ENCOUNTER — Ambulatory Visit (INDEPENDENT_AMBULATORY_CARE_PROVIDER_SITE_OTHER): Payer: PPO | Admitting: Infectious Disease

## 2019-07-07 ENCOUNTER — Encounter: Payer: Self-pay | Admitting: Infectious Disease

## 2019-07-07 ENCOUNTER — Other Ambulatory Visit: Payer: Self-pay

## 2019-07-07 VITALS — Wt >= 6400 oz

## 2019-07-07 DIAGNOSIS — A4901 Methicillin susceptible Staphylococcus aureus infection, unspecified site: Secondary | ICD-10-CM

## 2019-07-07 DIAGNOSIS — M869 Osteomyelitis, unspecified: Secondary | ICD-10-CM | POA: Diagnosis not present

## 2019-07-07 DIAGNOSIS — L089 Local infection of the skin and subcutaneous tissue, unspecified: Secondary | ICD-10-CM | POA: Diagnosis not present

## 2019-07-07 NOTE — Progress Notes (Signed)
Chief complaint : Follow-up for osteomyelitis of finger  Subjective:    Patient ID: Ronald Heath, male    DOB: Mar 01, 1946, 74 y.o.   MRN: [id number]  Hand Pain  Pertinent negatives include no chest pain.     74 y.o. male with significant PMH of diabetes mellitus, HTN, hyperlipidemia, hypothyroidism, depression, and OSA who presented for a left index finger infection. 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   . Blood culture from 10/12 with no growth to date. Left finger wound culture growing Staph aureus and Eikenella corrodens on culture  He received ceftriaxone and metronidazole for a month.  He was initially seen Dr. Grandville Silos in follow-up was quite happy with his progress.   However he then developed worsening of the second finger where he also had a burn that he has been apparently is picking at that area as well.  He also apparently fell on appears to have sustained a fracture in his left wrist is now wearing a cast.  After him seeing Dr. Grandville Silos again he was seen by Dr. Luan Pulling was concerned about infection in this finger and ordered plain films which are read as showing osteolysis of the tuft of the third distal phalanx concerning for Osteomyelitis.  When I initially spoke to Dr. Grandville Silos over the phone he did not think that there is indication for debridement of this finger but he did want to keep the patient on some antibiotics and so we initiated Augmentin which the patient is been taking since then.  He has continued on Augmentin since then and is finger looks improved on exam.  He did bring a CD-ROM with plain films on it but I could not load this on my computer and instead call for [employer].  Dr. Grandville Silos told mea t last visit  that the radiographs showed worsening cortical erosion despite him being on antibiotics and having an apparent clinical response based on his exam.  We continued augmentin  In the interim.     He is due to see Dr. Grandville Silos tomorrow and have repeat plain films.  We will recheck his inflammatory markers today as well.  Hand seems stable so has some pain where he had a wrist fracture    Past Medical History:  Diagnosis Date  . Depression   . Diabetes mellitus without complication ([place])   . Finger infection 12/31/2018  . Hyperlipidemia   . Hypertension   . Hypothyroidism   . Shortness of breath    with exertion  . Sleep apnea    pt is supposed to wear CPAP, but doesn't wear it    Past Surgical History:  Procedure Laterality Date  . AMPUTATION Left 01/01/2019   Procedure: AMPUTATION LEFT INDEX FINGER;  Surgeon: [name], MD;  Location: [facility];  Service: Orthopedics;  Laterality: Left;  . CATARACT EXTRACTION W/PHACO Right 11/21/2012   Procedure: CATARACT EXTRACTION PHACO AND INTRAOCULAR LENS PLACEMENT (IOC);  Surgeon: Tonny Branch, MD;  Location: [facility];  Service: Ophthalmology;  Laterality: Right;  CDE:19.67  . CATARACT EXTRACTION W/PHACO Left 12/19/2012   Procedure: CATARACT EXTRACTION PHACO AND INTRAOCULAR LENS PLACEMENT (IOC);  Surgeon: Tonny Branch, MD;  Location: [facility];  Service: Ophthalmology;  Laterality: Left;  CDE:8.00  . KNEE ARTHROSCOPY Left   . ROTATOR CUFF REPAIR Right     Family History  Problem Relation Age of Onset  . Diabetes Mellitus II Father   . Stroke  Father       Social History   Socioeconomic History  . Marital status: Widowed    Spouse name: Not on file  . Number of children: Not on file  . Years of education: Not on file  . Highest education level: Not on file  Occupational History  . Not on file  Tobacco Use  . Smoking status: Former Smoker    Packs/day: 2.00    Years: 15.00    Pack years: 30.00    Types: Cigarettes  . Smokeless tobacco: Never Used  Substance and Sexual Activity  . Alcohol use: Yes    Alcohol/week: 2.0 standard drinks    Types: 2 Cans of beer per week  . Drug use: No  . Sexual activity: Not on file   Other Topics Concern  . Not on file  Social History Narrative  . Not on file   Social Determinants of Health   Financial Resource Strain:   . Difficulty of Paying Living Expenses:   Food Insecurity:   . Worried About Charity fundraiser in the Last Year:   . Arboriculturist in the Last Year:   Transportation Needs:   . Film/video editor (Medical):   Marland Kitchen Lack of Transportation (Non-Medical):   Physical Activity:   . Days of Exercise per Week:   . Minutes of Exercise per Session:   Stress:   . Feeling of Stress :   Social Connections:   . Frequency of Communication with Friends and Family:   . Frequency of Social Gatherings with Friends and Family:   . Attends Religious Services:   . Active Member of Clubs or Organizations:   . Attends Archivist Meetings:   Marland Kitchen Marital Status:     No Known Allergies   Current Outpatient Medications:  .  amLODipine (NORVASC) 10 MG tablet, Take 5 mg by mouth daily. , Disp: , Rfl:  .  amoxicillin-clavulanate (AUGMENTIN) 875-125 MG tablet, Take 1 tablet by mouth 2 (two) times daily., Disp: 60 tablet, Rfl: 5 .  aspirin EC 81 MG tablet, Take 81 mg by mouth daily., Disp: , Rfl:  .  atorvastatin (LIPITOR) 20 MG tablet, Take 20 mg by mouth daily., Disp: , Rfl:  .  glipiZIDE (GLUCOTROL XL) 10 MG 24 hr tablet, Take 10 mg by mouth daily., Disp: , Rfl:  .  hydrALAZINE (APRESOLINE) 25 MG tablet, Take 1 tablet (25 mg total) by mouth 3 (three) times daily., Disp: 90 tablet, Rfl: 0 .  insulin glargine (LANTUS) 100 UNIT/ML injection, Inject 50 Units into the skin at bedtime., Disp: , Rfl:  .  insulin lispro (HUMALOG) 100 UNIT/ML injection, Inject 15 Units into the skin 3 (three) times daily after meals. , Disp: , Rfl:  .  levothyroxine (SYNTHROID, LEVOTHROID) 137 MCG tablet, Take 137 mcg by mouth daily before breakfast., Disp: , Rfl:  .  losartan (COZAAR) 100 MG tablet, Take 100 mg by mouth daily. Taking half tablet daily., Disp: , Rfl:  .   metFORMIN (GLUCOPHAGE) 1000 MG tablet, Take 1,000 mg by mouth 2 (two) times daily with a meal., Disp: , Rfl:  .  mometasone (NASONEX) 50 MCG/ACT nasal spray, Place 2 sprays into the nose daily as needed (for seasonal allergies). , Disp: , Rfl:  .  Naphazoline-Pheniramine (OPCON-A) 0.027-0.315 % SOLN, Place 1-2 drops into both eyes as needed (for itching)., Disp: , Rfl:  .  PARoxetine (PAXIL) 40 MG tablet, Take 20 mg by mouth every morning.,  Disp: , Rfl:  .  pioglitazone (ACTOS) 30 MG tablet, Take 15 mg by mouth daily. , Disp: , Rfl:  .  pramipexole (MIRAPEX) 1 MG tablet, Take 1 mg by mouth at bedtime., Disp: , Rfl:  .  Semaglutide,0.25 or 0.5MG /DOS, (OZEMPIC, 0.25 OR 0.5 MG/DOSE,) 2 MG/1.5ML SOPN, Inject 0.5 mg into the skin every Wednesday., Disp: , Rfl:  .  vitamin B-12 (CYANOCOBALAMIN) 1000 MCG tablet, Take 1,000 mcg by mouth daily., Disp: , Rfl:    Past Medical History:  Diagnosis Date  . Depression   . Diabetes mellitus without complication ([place])   . Finger infection 12/31/2018  . Hyperlipidemia   . Hypertension   . Hypothyroidism   . Shortness of breath    with exertion  . Sleep apnea    pt is supposed to wear CPAP, but doesn't wear it    Past Surgical History:  Procedure Laterality Date  . AMPUTATION Left 01/01/2019   Procedure: AMPUTATION LEFT INDEX FINGER;  Surgeon: [name], MD;  Location: [facility];  Service: Orthopedics;  Laterality: Left;  . CATARACT EXTRACTION W/PHACO Right 11/21/2012   Procedure: CATARACT EXTRACTION PHACO AND INTRAOCULAR LENS PLACEMENT (IOC);  Surgeon: Tonny Branch, MD;  Location: [facility];  Service: Ophthalmology;  Laterality: Right;  CDE:19.67  . CATARACT EXTRACTION W/PHACO Left 12/19/2012   Procedure: CATARACT EXTRACTION PHACO AND INTRAOCULAR LENS PLACEMENT (IOC);  Surgeon: Tonny Branch, MD;  Location: [facility];  Service: Ophthalmology;  Laterality: Left;  CDE:8.00  . KNEE ARTHROSCOPY Left   . ROTATOR CUFF REPAIR Right     Family History  Problem  Relation Age of Onset  . Diabetes Mellitus II Father   . Stroke Father       Social History   Socioeconomic History  . Marital status: Widowed    Spouse name: Not on file  . Number of children: Not on file  . Years of education: Not on file  . Highest education level: Not on file  Occupational History  . Not on file  Tobacco Use  . Smoking status: Former Smoker    Packs/day: 2.00    Years: 15.00    Pack years: 30.00    Types: Cigarettes  . Smokeless tobacco: Never Used  Substance and Sexual Activity  . Alcohol use: Yes    Alcohol/week: 2.0 standard drinks    Types: 2 Cans of beer per week  . Drug use: No  . Sexual activity: Not on file  Other Topics Concern  . Not on file  Social History Narrative  . Not on file   Social Determinants of Health   Financial Resource Strain:   . Difficulty of Paying Living Expenses:   Food Insecurity:   . Worried About Charity fundraiser in the Last Year:   . Arboriculturist in the Last Year:   Transportation Needs:   . Film/video editor (Medical):   Marland Kitchen Lack of Transportation (Non-Medical):   Physical Activity:   . Days of Exercise per Week:   . Minutes of Exercise per Session:   Stress:   . Feeling of Stress :   Social Connections:   . Frequency of Communication with Friends and Family:   . Frequency of Social Gatherings with Friends and Family:   . Attends Religious Services:   . Active Member of Clubs or Organizations:   . Attends Archivist Meetings:   Marland Kitchen Marital Status:     No Known Allergies   Current Outpatient Medications:  .  amLODipine (NORVASC) 10 MG tablet, Take 5 mg by mouth daily. , Disp: , Rfl:  .  amoxicillin-clavulanate (AUGMENTIN) 875-125 MG tablet, Take 1 tablet by mouth 2 (two) times daily., Disp: 60 tablet, Rfl: 5 .  aspirin EC 81 MG tablet, Take 81 mg by mouth daily., Disp: , Rfl:  .  atorvastatin (LIPITOR) 20 MG tablet, Take 20 mg by mouth daily., Disp: , Rfl:  .  glipiZIDE (GLUCOTROL  XL) 10 MG 24 hr tablet, Take 10 mg by mouth daily., Disp: , Rfl:  .  hydrALAZINE (APRESOLINE) 25 MG tablet, Take 1 tablet (25 mg total) by mouth 3 (three) times daily., Disp: 90 tablet, Rfl: 0 .  insulin glargine (LANTUS) 100 UNIT/ML injection, Inject 50 Units into the skin at bedtime., Disp: , Rfl:  .  insulin lispro (HUMALOG) 100 UNIT/ML injection, Inject 15 Units into the skin 3 (three) times daily after meals. , Disp: , Rfl:  .  levothyroxine (SYNTHROID, LEVOTHROID) 137 MCG tablet, Take 137 mcg by mouth daily before breakfast., Disp: , Rfl:  .  losartan (COZAAR) 100 MG tablet, Take 100 mg by mouth daily. Taking half tablet daily., Disp: , Rfl:  .  metFORMIN (GLUCOPHAGE) 1000 MG tablet, Take 1,000 mg by mouth 2 (two) times daily with a meal., Disp: , Rfl:  .  mometasone (NASONEX) 50 MCG/ACT nasal spray, Place 2 sprays into the nose daily as needed (for seasonal allergies). , Disp: , Rfl:  .  Naphazoline-Pheniramine (OPCON-A) 0.027-0.315 % SOLN, Place 1-2 drops into both eyes as needed (for itching)., Disp: , Rfl:  .  PARoxetine (PAXIL) 40 MG tablet, Take 20 mg by mouth every morning., Disp: , Rfl:  .  pioglitazone (ACTOS) 30 MG tablet, Take 15 mg by mouth daily. , Disp: , Rfl:  .  pramipexole (MIRAPEX) 1 MG tablet, Take 1 mg by mouth at bedtime., Disp: , Rfl:  .  Semaglutide,0.25 or 0.5MG /DOS, (OZEMPIC, 0.25 OR 0.5 MG/DOSE,) 2 MG/1.5ML SOPN, Inject 0.5 mg into the skin every Wednesday., Disp: , Rfl:  .  vitamin B-12 (CYANOCOBALAMIN) 1000 MCG tablet, Take 1,000 mcg by mouth daily., Disp: , Rfl:    Review of Systems  Constitutional: Negative for chills and fever.  HENT: Negative for congestion and sore throat.   Eyes: Negative for photophobia.  Respiratory: Negative for cough, shortness of breath and wheezing.   Cardiovascular: Negative for chest pain, palpitations and leg swelling.  Gastrointestinal: Negative for abdominal pain, blood in stool, constipation, diarrhea, nausea and vomiting.   Genitourinary: Negative for dysuria, flank pain and hematuria.  Musculoskeletal: Positive for arthralgias. Negative for back pain and myalgias.  Skin: Positive for color change and wound. Negative for rash.  Neurological: Negative for dizziness, weakness and headaches.  Hematological: Does not bruise/bleed easily.  Psychiatric/Behavioral: Negative for suicidal ideas.       Objective:   Physical Exam Constitutional:      General: He is not in acute distress.    Appearance: Normal appearance. He is well-developed. He is not ill-appearing or diaphoretic.  HENT:     Head: Normocephalic and atraumatic.     Right Ear: Hearing and external ear normal.     Left Ear: Hearing and external ear normal.     Nose: No nasal deformity or rhinorrhea.  Eyes:     General: No scleral icterus.    Conjunctiva/sclera: Conjunctivae normal.     Right eye: Right conjunctiva is not injected.     Left eye: Left conjunctiva is not injected.  Pupils: Pupils are equal, round, and reactive to light.  Neck:     Vascular: No JVD.  Cardiovascular:     Rate and Rhythm: Normal rate and regular rhythm.     Heart sounds: S1 normal and S2 normal.  Abdominal:     General: There is no distension.     Palpations: Abdomen is soft.  Musculoskeletal:        General: Deformity present. Normal range of motion.     Right shoulder: Normal.     Left shoulder: Normal.     Cervical back: Normal range of motion and neck supple.     Right hip: Normal.     Left hip: Normal.     Right knee: Normal.     Left knee: Normal.  Lymphadenopathy:     Head:     Right side of head: No submandibular, preauricular or posterior auricular adenopathy.     Left side of head: No submandibular, preauricular or posterior auricular adenopathy.     Cervical: No cervical adenopathy.     Right cervical: No superficial or deep cervical adenopathy.    Left cervical: No superficial or deep cervical adenopathy.  Skin:    General: Skin is warm  and dry.     Coloration: Skin is not pale.     Findings: No abrasion, bruising, ecchymosis, erythema, lesion or rash.     Nails: There is no clubbing.  Neurological:     General: No focal deficit present.     Mental Status: He is alert and oriented to person, place, and time.     Sensory: No sensory deficit.     Coordination: Coordination normal.     Gait: Gait normal.  Psychiatric:        Attention and Perception: He is attentive.        Mood and Affect: Mood normal.        Speech: Speech normal.        Behavior: Behavior normal. Behavior is cooperative.        Thought Content: Thought content normal.        Judgment: Judgment normal.      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 Grandville Silos tomorrow with repeating plain films.  I will get in touch with drops and see what his impression is.  Possibly might build to stop antibiotics versus may give him more protracted antibiotics have scheduled him with me in clinic in 1 month's time.

## 2019-07-08 DIAGNOSIS — M869 Osteomyelitis, unspecified: Secondary | ICD-10-CM | POA: Diagnosis not present

## 2019-07-08 DIAGNOSIS — M654 Radial styloid tenosynovitis [de Quervain]: Secondary | ICD-10-CM | POA: Diagnosis not present

## 2019-07-08 LAB — BASIC METABOLIC PANEL WITH GFR
BUN: 16 mg/dL (ref 7–25)
CO2: 22 mmol/L (ref 20–32)
Calcium: 8.4 mg/dL — ABNORMAL LOW (ref 8.6–10.3)
Chloride: 103 mmol/L (ref 98–110)
Creat: 1.02 mg/dL (ref 0.70–1.18)
GFR, Est African American: 84 mL/min/{1.73_m2} (ref >=60)
GFR, Est Non African American: 73 mL/min/{1.73_m2} (ref >=60)
Glucose, Bld: 215 mg/dL — ABNORMAL HIGH (ref 65–99)
Potassium: 4.4 mmol/L (ref 3.5–5.3)
Sodium: 138 mmol/L (ref 135–146)

## 2019-07-08 LAB — CBC WITH DIFFERENTIAL/PLATELET
Absolute Monocytes: 571 cells/uL (ref 200–950)
Basophils Absolute: 27 cells/uL (ref 0–200)
Basophils Relative: 0.4 %
Eosinophils Absolute: 150 cells/uL (ref 15–500)
Eosinophils Relative: 2.2 %
HCT: 35.3 % — ABNORMAL LOW (ref 38.5–50.0)
Hemoglobin: 11.1 g/dL — ABNORMAL LOW (ref 13.2–17.1)
Lymphs Abs: 986 cells/uL (ref 850–3900)
MCH: 27.4 pg (ref 27.0–33.0)
MCHC: 31.4 g/dL — ABNORMAL LOW (ref 32.0–36.0)
MCV: 87.2 fL (ref 80.0–100.0)
MPV: 11.9 fL (ref 7.5–12.5)
Monocytes Relative: 8.4 %
Neutro Abs: 5066 cells/uL (ref 1500–7800)
Neutrophils Relative %: 74.5 %
Platelets: 226 10*3/uL (ref 140–400)
RBC: 4.05 10*6/uL — ABNORMAL LOW (ref 4.20–5.80)
RDW: 14 % (ref 11.0–15.0)
Total Lymphocyte: 14.5 %
WBC: 6.8 10*3/uL (ref 3.8–10.8)

## 2019-07-08 LAB — C-REACTIVE PROTEIN: CRP: 6.9 mg/L (ref <8.0)

## 2019-07-08 LAB — SEDIMENTATION RATE: Sed Rate: 33 mm/h — ABNORMAL HIGH (ref 0–20)

## 2019-07-10 DIAGNOSIS — Z794 Long term (current) use of insulin: Secondary | ICD-10-CM | POA: Diagnosis not present

## 2019-07-10 DIAGNOSIS — E113393 Type 2 diabetes mellitus with moderate nonproliferative diabetic retinopathy without macular edema, bilateral: Secondary | ICD-10-CM | POA: Diagnosis not present

## 2019-07-10 DIAGNOSIS — E1142 Type 2 diabetes mellitus with diabetic polyneuropathy: Secondary | ICD-10-CM | POA: Diagnosis not present

## 2019-07-10 DIAGNOSIS — E782 Mixed hyperlipidemia: Secondary | ICD-10-CM | POA: Diagnosis not present

## 2019-07-10 DIAGNOSIS — E039 Hypothyroidism, unspecified: Secondary | ICD-10-CM | POA: Diagnosis not present

## 2019-07-10 DIAGNOSIS — Z79899 Other long term (current) drug therapy: Secondary | ICD-10-CM | POA: Diagnosis not present

## 2019-07-10 DIAGNOSIS — I10 Essential (primary) hypertension: Secondary | ICD-10-CM | POA: Diagnosis not present

## 2019-07-10 DIAGNOSIS — E1165 Type 2 diabetes mellitus with hyperglycemia: Secondary | ICD-10-CM | POA: Diagnosis not present

## 2019-07-10 DIAGNOSIS — E538 Deficiency of other specified B group vitamins: Secondary | ICD-10-CM | POA: Diagnosis not present

## 2019-07-17 DIAGNOSIS — E1165 Type 2 diabetes mellitus with hyperglycemia: Secondary | ICD-10-CM | POA: Diagnosis not present

## 2019-07-17 DIAGNOSIS — E785 Hyperlipidemia, unspecified: Secondary | ICD-10-CM | POA: Diagnosis not present

## 2019-07-17 DIAGNOSIS — R0602 Shortness of breath: Secondary | ICD-10-CM | POA: Diagnosis not present

## 2019-07-17 DIAGNOSIS — D519 Vitamin B12 deficiency anemia, unspecified: Secondary | ICD-10-CM | POA: Diagnosis not present

## 2019-07-17 DIAGNOSIS — F331 Major depressive disorder, recurrent, moderate: Secondary | ICD-10-CM | POA: Diagnosis not present

## 2019-07-17 DIAGNOSIS — I10 Essential (primary) hypertension: Secondary | ICD-10-CM | POA: Diagnosis not present

## 2019-07-17 DIAGNOSIS — G2581 Restless legs syndrome: Secondary | ICD-10-CM | POA: Diagnosis not present

## 2019-07-17 DIAGNOSIS — E039 Hypothyroidism, unspecified: Secondary | ICD-10-CM | POA: Diagnosis not present

## 2019-07-17 DIAGNOSIS — L089 Local infection of the skin and subcutaneous tissue, unspecified: Secondary | ICD-10-CM | POA: Diagnosis not present

## 2019-08-05 DIAGNOSIS — M869 Osteomyelitis, unspecified: Secondary | ICD-10-CM | POA: Diagnosis not present

## 2019-08-05 DIAGNOSIS — M654 Radial styloid tenosynovitis [de Quervain]: Secondary | ICD-10-CM | POA: Diagnosis not present

## 2019-08-08 DIAGNOSIS — E113313 Type 2 diabetes mellitus with moderate nonproliferative diabetic retinopathy with macular edema, bilateral: Secondary | ICD-10-CM | POA: Diagnosis not present

## 2019-08-08 DIAGNOSIS — H43813 Vitreous degeneration, bilateral: Secondary | ICD-10-CM | POA: Diagnosis not present

## 2019-08-08 DIAGNOSIS — H31091 Other chorioretinal scars, right eye: Secondary | ICD-10-CM | POA: Diagnosis not present

## 2019-08-08 DIAGNOSIS — H3582 Retinal ischemia: Secondary | ICD-10-CM | POA: Diagnosis not present

## 2019-08-11 ENCOUNTER — Ambulatory Visit: Payer: PPO | Admitting: Infectious Disease

## 2019-09-01 ENCOUNTER — Ambulatory Visit (INDEPENDENT_AMBULATORY_CARE_PROVIDER_SITE_OTHER): Payer: PPO | Admitting: Infectious Disease

## 2019-09-01 ENCOUNTER — Other Ambulatory Visit: Payer: Self-pay

## 2019-09-01 ENCOUNTER — Encounter: Payer: Self-pay | Admitting: Infectious Disease

## 2019-09-01 VITALS — BP 173/86 | HR 73 | Wt >= 6400 oz

## 2019-09-01 DIAGNOSIS — A4901 Methicillin susceptible Staphylococcus aureus infection, unspecified site: Secondary | ICD-10-CM | POA: Diagnosis not present

## 2019-09-01 DIAGNOSIS — E119 Type 2 diabetes mellitus without complications: Secondary | ICD-10-CM | POA: Diagnosis not present

## 2019-09-01 DIAGNOSIS — L089 Local infection of the skin and subcutaneous tissue, unspecified: Secondary | ICD-10-CM | POA: Diagnosis not present

## 2019-09-01 DIAGNOSIS — M869 Osteomyelitis, unspecified: Secondary | ICD-10-CM

## 2019-09-01 NOTE — Progress Notes (Signed)
Chief complaint : Follow-up for osteomyelitis of finger  Subjective:    Patient ID: Ronald Heath, male    DOB: March 12, 1946, 74 y.o.   MRN: [id number]  Hand Pain  Pertinent negatives include no chest pain.     75 y.o. male with significant PMH of diabetes mellitus, HTN, hyperlipidemia, hypothyroidism, depression, and OSA who presented for a left index finger infection. 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   . Blood culture from 10/12 with no growth to date. Left finger wound culture growing Staph aureus and Eikenella corrodens on culture  He received ceftriaxone and metronidazole for a month.  He was initially seen Dr. Grandville Silos in follow-up was quite happy with his progress.   However he then developed worsening of the second finger where he also had a burn that he has been apparently is picking at that area as well.  He also apparently fell on appears to have sustained a fracture in his left wrist is now wearing a cast.  After him seeing Dr. Grandville Silos again he was seen by Dr. Luan Pulling was concerned about infection in this finger and ordered plain films which are read as showing osteolysis of the tuft of the third distal phalanx concerning for Osteomyelitis.  When I initially spoke to Dr. Grandville Silos over the phone he did not think that there is indication for debridement of this finger but he did want to keep the patient on some antibiotics and so we initiated Augmentin which the patient is been taking since then.  He has continued on Augmentin since then and is finger looks improved on exam.  He did bring a CD-ROM with plain films on it but I could not load this on my computer and instead call for [employer].  Dr. Grandville Silos told mea t last visit  that the radiographs showed worsening cortical erosion despite him being on antibiotics and having an apparent clinical response based on his exam.  We continued augmentin  In the interim.    Since we last saw him he saw Dr. Grandville Silos in early May who got radiographs and felt that the patient had both clinically resolved and also radiographically had improved.  Patient has remained on Augmentin since then.  He does not have any pain at the site and it looks encouraging to me.  We will check labs today but plan on stopping his Augmentin    Past Medical History:  Diagnosis Date  . Depression   . Diabetes mellitus without complication ([place])   . Finger infection 12/31/2018  . Hyperlipidemia   . Hypertension   . Hypothyroidism   . Shortness of breath    with exertion  . Sleep apnea    pt is supposed to wear CPAP, but doesn't wear it    Past Surgical History:  Procedure Laterality Date  . AMPUTATION Left 01/01/2019   Procedure: AMPUTATION LEFT INDEX FINGER;  Surgeon: [name], MD;  Location: [place];  Service: Orthopedics;  Laterality: Left;  . CATARACT EXTRACTION W/PHACO Right 11/21/2012   Procedure: CATARACT EXTRACTION PHACO AND INTRAOCULAR LENS PLACEMENT (IOC);  Surgeon: Tonny Branch, MD;  Location: [facility];  Service: Ophthalmology;  Laterality: Right;  CDE:19.67  . CATARACT EXTRACTION W/PHACO Left 12/19/2012   Procedure: CATARACT EXTRACTION PHACO AND INTRAOCULAR LENS PLACEMENT (IOC);  Surgeon: Tonny Branch, MD;  Location: [facility];  Service: Ophthalmology;  Laterality: Left;  CDE:8.00  . KNEE ARTHROSCOPY Left   .  ROTATOR CUFF REPAIR Right     Family History  Problem Relation Age of Onset  . Diabetes Mellitus II Father   . Stroke Father       Social History   Socioeconomic History  . Marital status: Widowed    Spouse name: Not on file  . Number of children: Not on file  . Years of education: Not on file  . Highest education level: Not on file  Occupational History  . Not on file  Tobacco Use  . Smoking status: Former Smoker    Packs/day: 2.00    Years: 15.00    Pack years: 30.00    Types: Cigarettes  . Smokeless tobacco: Never Used  Vaping Use  .  Vaping Use: Never used  Substance and Sexual Activity  . Alcohol use: Yes    Alcohol/week: 2.0 standard drinks    Types: 2 Cans of beer per week  . Drug use: No  . Sexual activity: Not on file  Other Topics Concern  . Not on file  Social History Narrative  . Not on file   Social Determinants of Health   Financial Resource Strain:   . Difficulty of Paying Living Expenses:   Food Insecurity:   . Worried About Charity fundraiser in the Last Year:   . Arboriculturist in the Last Year:   Transportation Needs:   . Film/video editor (Medical):   Marland Kitchen Lack of Transportation (Non-Medical):   Physical Activity:   . Days of Exercise per Week:   . Minutes of Exercise per Session:   Stress:   . Feeling of Stress :   Social Connections:   . Frequency of Communication with Friends and Family:   . Frequency of Social Gatherings with Friends and Family:   . Attends Religious Services:   . Active Member of Clubs or Organizations:   . Attends Archivist Meetings:   Marland Kitchen Marital Status:     No Known Allergies   Current Outpatient Medications:  .  amLODipine (NORVASC) 10 MG tablet, Take 5 mg by mouth daily. , Disp: , Rfl:  .  amoxicillin-clavulanate (AUGMENTIN) 875-125 MG tablet, Take 1 tablet by mouth 2 (two) times daily., Disp: 60 tablet, Rfl: 5 .  aspirin EC 81 MG tablet, Take 81 mg by mouth daily., Disp: , Rfl:  .  atorvastatin (LIPITOR) 20 MG tablet, Take 20 mg by mouth daily., Disp: , Rfl:  .  glipiZIDE (GLUCOTROL XL) 10 MG 24 hr tablet, Take 10 mg by mouth daily., Disp: , Rfl:  .  hydrALAZINE (APRESOLINE) 25 MG tablet, Take 1 tablet (25 mg total) by mouth 3 (three) times daily., Disp: 90 tablet, Rfl: 0 .  insulin glargine (LANTUS) 100 UNIT/ML injection, Inject 50 Units into the skin at bedtime., Disp: , Rfl:  .  insulin lispro (HUMALOG) 100 UNIT/ML injection, Inject 15 Units into the skin 3 (three) times daily after meals. , Disp: , Rfl:  .  levothyroxine (SYNTHROID,  LEVOTHROID) 137 MCG tablet, Take 137 mcg by mouth daily before breakfast., Disp: , Rfl:  .  losartan (COZAAR) 100 MG tablet, Take 100 mg by mouth daily. Taking half tablet daily., Disp: , Rfl:  .  metFORMIN (GLUCOPHAGE) 1000 MG tablet, Take 1,000 mg by mouth 2 (two) times daily with a meal., Disp: , Rfl:  .  mometasone (NASONEX) 50 MCG/ACT nasal spray, Place 2 sprays into the nose daily as needed (for seasonal allergies). , Disp: , Rfl:  .  Naphazoline-Pheniramine (OPCON-A) 0.027-0.315 % SOLN, Place 1-2 drops into both eyes as needed (for itching)., Disp: , Rfl:  .  PARoxetine (PAXIL) 40 MG tablet, Take 20 mg by mouth every morning., Disp: , Rfl:  .  pioglitazone (ACTOS) 30 MG tablet, Take 15 mg by mouth daily. , Disp: , Rfl:  .  pramipexole (MIRAPEX) 1 MG tablet, Take 1 mg by mouth at bedtime., Disp: , Rfl:  .  Semaglutide,0.25 or 0.5MG /DOS, (OZEMPIC, 0.25 OR 0.5 MG/DOSE,) 2 MG/1.5ML SOPN, Inject 0.5 mg into the skin every Wednesday., Disp: , Rfl:  .  vitamin B-12 (CYANOCOBALAMIN) 1000 MCG tablet, Take 1,000 mcg by mouth daily., Disp: , Rfl:    Past Medical History:  Diagnosis Date  . Depression   . Diabetes mellitus without complication ([place])   . Finger infection 12/31/2018  . Hyperlipidemia   . Hypertension   . Hypothyroidism   . Shortness of breath    with exertion  . Sleep apnea    pt is supposed to wear CPAP, but doesn't wear it    Past Surgical History:  Procedure Laterality Date  . AMPUTATION Left 01/01/2019   Procedure: AMPUTATION LEFT INDEX FINGER;  Surgeon: [name], MD;  Location: [place];  Service: Orthopedics;  Laterality: Left;  . CATARACT EXTRACTION W/PHACO Right 11/21/2012   Procedure: CATARACT EXTRACTION PHACO AND INTRAOCULAR LENS PLACEMENT (IOC);  Surgeon: Tonny Branch, MD;  Location: [facility];  Service: Ophthalmology;  Laterality: Right;  CDE:19.67  . CATARACT EXTRACTION W/PHACO Left 12/19/2012   Procedure: CATARACT EXTRACTION PHACO AND INTRAOCULAR LENS PLACEMENT  (IOC);  Surgeon: Tonny Branch, MD;  Location: [facility];  Service: Ophthalmology;  Laterality: Left;  CDE:8.00  . KNEE ARTHROSCOPY Left   . ROTATOR CUFF REPAIR Right     Family History  Problem Relation Age of Onset  . Diabetes Mellitus II Father   . Stroke Father       Social History   Socioeconomic History  . Marital status: Widowed    Spouse name: Not on file  . Number of children: Not on file  . Years of education: Not on file  . Highest education level: Not on file  Occupational History  . Not on file  Tobacco Use  . Smoking status: Former Smoker    Packs/day: 2.00    Years: 15.00    Pack years: 30.00    Types: Cigarettes  . Smokeless tobacco: Never Used  Vaping Use  . Vaping Use: Never used  Substance and Sexual Activity  . Alcohol use: Yes    Alcohol/week: 2.0 standard drinks    Types: 2 Cans of beer per week  . Drug use: No  . Sexual activity: Not on file  Other Topics Concern  . Not on file  Social History Narrative  . Not on file   Social Determinants of Health   Financial Resource Strain:   . Difficulty of Paying Living Expenses:   Food Insecurity:   . Worried About Charity fundraiser in the Last Year:   . Arboriculturist in the Last Year:   Transportation Needs:   . Film/video editor (Medical):   Marland Kitchen Lack of Transportation (Non-Medical):   Physical Activity:   . Days of Exercise per Week:   . Minutes of Exercise per Session:   Stress:   . Feeling of Stress :   Social Connections:   . Frequency of Communication with Friends and Family:   . Frequency of Social Gatherings with Friends  and Family:   . Attends Religious Services:   . Active Member of Clubs or Organizations:   . Attends Archivist Meetings:   Marland Kitchen Marital Status:     No Known Allergies   Current Outpatient Medications:  .  amLODipine (NORVASC) 10 MG tablet, Take 5 mg by mouth daily. , Disp: , Rfl:  .  amoxicillin-clavulanate (AUGMENTIN) 875-125 MG tablet, Take 1  tablet by mouth 2 (two) times daily., Disp: 60 tablet, Rfl: 5 .  aspirin EC 81 MG tablet, Take 81 mg by mouth daily., Disp: , Rfl:  .  atorvastatin (LIPITOR) 20 MG tablet, Take 20 mg by mouth daily., Disp: , Rfl:  .  glipiZIDE (GLUCOTROL XL) 10 MG 24 hr tablet, Take 10 mg by mouth daily., Disp: , Rfl:  .  hydrALAZINE (APRESOLINE) 25 MG tablet, Take 1 tablet (25 mg total) by mouth 3 (three) times daily., Disp: 90 tablet, Rfl: 0 .  insulin glargine (LANTUS) 100 UNIT/ML injection, Inject 50 Units into the skin at bedtime., Disp: , Rfl:  .  insulin lispro (HUMALOG) 100 UNIT/ML injection, Inject 15 Units into the skin 3 (three) times daily after meals. , Disp: , Rfl:  .  levothyroxine (SYNTHROID, LEVOTHROID) 137 MCG tablet, Take 137 mcg by mouth daily before breakfast., Disp: , Rfl:  .  losartan (COZAAR) 100 MG tablet, Take 100 mg by mouth daily. Taking half tablet daily., Disp: , Rfl:  .  metFORMIN (GLUCOPHAGE) 1000 MG tablet, Take 1,000 mg by mouth 2 (two) times daily with a meal., Disp: , Rfl:  .  mometasone (NASONEX) 50 MCG/ACT nasal spray, Place 2 sprays into the nose daily as needed (for seasonal allergies). , Disp: , Rfl:  .  Naphazoline-Pheniramine (OPCON-A) 0.027-0.315 % SOLN, Place 1-2 drops into both eyes as needed (for itching)., Disp: , Rfl:  .  PARoxetine (PAXIL) 40 MG tablet, Take 20 mg by mouth every morning., Disp: , Rfl:  .  pioglitazone (ACTOS) 30 MG tablet, Take 15 mg by mouth daily. , Disp: , Rfl:  .  pramipexole (MIRAPEX) 1 MG tablet, Take 1 mg by mouth at bedtime., Disp: , Rfl:  .  Semaglutide,0.25 or 0.5MG /DOS, (OZEMPIC, 0.25 OR 0.5 MG/DOSE,) 2 MG/1.5ML SOPN, Inject 0.5 mg into the skin every Wednesday., Disp: , Rfl:  .  vitamin B-12 (CYANOCOBALAMIN) 1000 MCG tablet, Take 1,000 mcg by mouth daily., Disp: , Rfl:    Review of Systems  Constitutional: Negative for chills and fever.  HENT: Negative for congestion and sore throat.   Eyes: Negative for photophobia.  Respiratory:  Negative for cough, shortness of breath and wheezing.   Cardiovascular: Negative for chest pain, palpitations and leg swelling.  Gastrointestinal: Negative for abdominal pain, blood in stool, constipation, diarrhea, nausea and vomiting.  Genitourinary: Negative for dysuria, flank pain and hematuria.  Musculoskeletal: Negative for back pain and myalgias.  Skin: Positive for wound. Negative for rash.  Neurological: Negative for dizziness, weakness and headaches.  Hematological: Does not bruise/bleed easily.  Psychiatric/Behavioral: Negative for suicidal ideas.       Objective:   Physical Exam Constitutional:      General: He is not in acute distress.    Appearance: Normal appearance. He is well-developed. He is not ill-appearing or diaphoretic.  HENT:     Head: Normocephalic and atraumatic.     Right Ear: Hearing and external ear normal.     Left Ear: Hearing and external ear normal.     Nose: No nasal deformity or  rhinorrhea.  Eyes:     General: No scleral icterus.    Extraocular Movements: Extraocular movements intact.     Conjunctiva/sclera: Conjunctivae normal.     Right eye: Right conjunctiva is not injected.     Left eye: Left conjunctiva is not injected.  Neck:     Vascular: No JVD.  Cardiovascular:     Rate and Rhythm: Normal rate and regular rhythm.     Heart sounds: S1 normal and S2 normal.  Abdominal:     General: There is no distension.     Palpations: Abdomen is soft.  Musculoskeletal:        General: Deformity present. Normal range of motion.     Right shoulder: Normal.     Left shoulder: Normal.     Cervical back: Normal range of motion and neck supple.     Right hip: Normal.     Left hip: Normal.     Right knee: Normal.     Left knee: Normal.  Lymphadenopathy:     Head:     Right side of head: No submandibular, preauricular or posterior auricular adenopathy.     Left side of head: No submandibular, preauricular or posterior auricular adenopathy.      Cervical: No cervical adenopathy.     Right cervical: No superficial or deep cervical adenopathy.    Left cervical: No superficial or deep cervical adenopathy.  Skin:    General: Skin is warm and dry.     Coloration: Skin is not pale.     Findings: No abrasion, bruising, ecchymosis, erythema, lesion or rash.     Nails: There is no clubbing.  Neurological:     General: No focal deficit present.     Mental Status: He is alert and oriented to person, place, and time.     Sensory: No sensory deficit.     Coordination: Coordination normal.     Gait: Gait normal.  Psychiatric:        Attention and Perception: He is attentive.        Mood and Affect: Mood normal.        Speech: Speech normal.        Behavior: Behavior normal. Behavior is cooperative.        Thought Content: Thought content normal.        Judgment: Judgment normal.      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  Plain films are reassuring.  Will recheck inflammatory markers but stop Augmentin.  I plan to see him in 2 months time  Covid vaccination status: He has had his Covid vaccines

## 2019-09-02 LAB — CBC WITH DIFFERENTIAL/PLATELET
Absolute Monocytes: 591 cells/uL (ref 200–950)
Basophils Absolute: 37 cells/uL (ref 0–200)
Basophils Relative: 0.5 %
Eosinophils Absolute: 139 cells/uL (ref 15–500)
Eosinophils Relative: 1.9 %
HCT: 38.8 % (ref 38.5–50.0)
Hemoglobin: 12.6 g/dL — ABNORMAL LOW (ref 13.2–17.1)
Lymphs Abs: 1080 cells/uL (ref 850–3900)
MCH: 27.7 pg (ref 27.0–33.0)
MCHC: 32.5 g/dL (ref 32.0–36.0)
MCV: 85.3 fL (ref 80.0–100.0)
MPV: 11.1 fL (ref 7.5–12.5)
Monocytes Relative: 8.1 %
Neutro Abs: 5453 cells/uL (ref 1500–7800)
Neutrophils Relative %: 74.7 %
Platelets: 223 10*3/uL (ref 140–400)
RBC: 4.55 10*6/uL (ref 4.20–5.80)
RDW: 14.2 % (ref 11.0–15.0)
Total Lymphocyte: 14.8 %
WBC: 7.3 10*3/uL (ref 3.8–10.8)

## 2019-09-02 LAB — C-REACTIVE PROTEIN: CRP: 7.4 mg/L (ref <8.0)

## 2019-09-02 LAB — BASIC METABOLIC PANEL WITH GFR
BUN: 21 mg/dL (ref 7–25)
CO2: 29 mmol/L (ref 20–32)
Calcium: 9 mg/dL (ref 8.6–10.3)
Chloride: 101 mmol/L (ref 98–110)
Creat: 1.14 mg/dL (ref 0.70–1.18)
GFR, Est African American: 74 mL/min/{1.73_m2} (ref >=60)
GFR, Est Non African American: 63 mL/min/{1.73_m2} (ref >=60)
Glucose, Bld: 123 mg/dL — ABNORMAL HIGH (ref 65–99)
Potassium: 4.8 mmol/L (ref 3.5–5.3)
Sodium: 138 mmol/L (ref 135–146)

## 2019-09-02 LAB — SEDIMENTATION RATE: Sed Rate: 41 mm/h — ABNORMAL HIGH (ref 0–20)

## 2019-10-07 DIAGNOSIS — E782 Mixed hyperlipidemia: Secondary | ICD-10-CM | POA: Diagnosis not present

## 2019-10-07 DIAGNOSIS — E1165 Type 2 diabetes mellitus with hyperglycemia: Secondary | ICD-10-CM | POA: Diagnosis not present

## 2019-10-07 DIAGNOSIS — Z794 Long term (current) use of insulin: Secondary | ICD-10-CM | POA: Diagnosis not present

## 2019-10-07 DIAGNOSIS — I10 Essential (primary) hypertension: Secondary | ICD-10-CM | POA: Diagnosis not present

## 2019-10-09 DIAGNOSIS — I10 Essential (primary) hypertension: Secondary | ICD-10-CM | POA: Diagnosis not present

## 2019-10-09 DIAGNOSIS — E1165 Type 2 diabetes mellitus with hyperglycemia: Secondary | ICD-10-CM | POA: Diagnosis not present

## 2019-10-09 DIAGNOSIS — Z794 Long term (current) use of insulin: Secondary | ICD-10-CM | POA: Diagnosis not present

## 2019-10-09 DIAGNOSIS — E1142 Type 2 diabetes mellitus with diabetic polyneuropathy: Secondary | ICD-10-CM | POA: Diagnosis not present

## 2019-10-09 DIAGNOSIS — E782 Mixed hyperlipidemia: Secondary | ICD-10-CM | POA: Diagnosis not present

## 2019-10-09 DIAGNOSIS — Z79899 Other long term (current) drug therapy: Secondary | ICD-10-CM | POA: Diagnosis not present

## 2019-10-30 DIAGNOSIS — E039 Hypothyroidism, unspecified: Secondary | ICD-10-CM | POA: Diagnosis not present

## 2019-10-30 DIAGNOSIS — D519 Vitamin B12 deficiency anemia, unspecified: Secondary | ICD-10-CM | POA: Diagnosis not present

## 2019-10-30 DIAGNOSIS — E1165 Type 2 diabetes mellitus with hyperglycemia: Secondary | ICD-10-CM | POA: Diagnosis not present

## 2019-10-30 DIAGNOSIS — R0602 Shortness of breath: Secondary | ICD-10-CM | POA: Diagnosis not present

## 2019-10-30 DIAGNOSIS — E785 Hyperlipidemia, unspecified: Secondary | ICD-10-CM | POA: Diagnosis not present

## 2019-10-30 DIAGNOSIS — G2581 Restless legs syndrome: Secondary | ICD-10-CM | POA: Diagnosis not present

## 2019-10-30 DIAGNOSIS — F331 Major depressive disorder, recurrent, moderate: Secondary | ICD-10-CM | POA: Diagnosis not present

## 2019-10-30 DIAGNOSIS — I10 Essential (primary) hypertension: Secondary | ICD-10-CM | POA: Diagnosis not present

## 2019-10-31 DIAGNOSIS — H3582 Retinal ischemia: Secondary | ICD-10-CM | POA: Diagnosis not present

## 2019-10-31 DIAGNOSIS — H43813 Vitreous degeneration, bilateral: Secondary | ICD-10-CM | POA: Diagnosis not present

## 2019-10-31 DIAGNOSIS — E113313 Type 2 diabetes mellitus with moderate nonproliferative diabetic retinopathy with macular edema, bilateral: Secondary | ICD-10-CM | POA: Diagnosis not present

## 2019-10-31 DIAGNOSIS — D3131 Benign neoplasm of right choroid: Secondary | ICD-10-CM | POA: Diagnosis not present

## 2019-11-03 ENCOUNTER — Encounter: Payer: Self-pay | Admitting: Infectious Disease

## 2019-11-03 ENCOUNTER — Ambulatory Visit (INDEPENDENT_AMBULATORY_CARE_PROVIDER_SITE_OTHER): Payer: PPO | Admitting: Infectious Disease

## 2019-11-03 ENCOUNTER — Other Ambulatory Visit: Payer: Self-pay

## 2019-11-03 VITALS — BP 187/70 | HR 75 | Wt >= 6400 oz

## 2019-11-03 DIAGNOSIS — A4901 Methicillin susceptible Staphylococcus aureus infection, unspecified site: Secondary | ICD-10-CM

## 2019-11-03 DIAGNOSIS — E119 Type 2 diabetes mellitus without complications: Secondary | ICD-10-CM | POA: Diagnosis not present

## 2019-11-03 DIAGNOSIS — L089 Local infection of the skin and subcutaneous tissue, unspecified: Secondary | ICD-10-CM

## 2019-11-03 DIAGNOSIS — Z114 Encounter for screening for human immunodeficiency virus [HIV]: Secondary | ICD-10-CM | POA: Diagnosis not present

## 2019-11-03 DIAGNOSIS — M869 Osteomyelitis, unspecified: Secondary | ICD-10-CM | POA: Diagnosis not present

## 2019-11-03 DIAGNOSIS — Z1159 Encounter for screening for other viral diseases: Secondary | ICD-10-CM

## 2019-11-03 NOTE — Progress Notes (Signed)
Chief complaint : Follow-up for osteomyelitis of finger, having sustained another cut to his index finger in the last 4 days  Subjective:    Patient ID: Ronald Heath, male    DOB: 05-27-1945, 74 y.o.   MRN: [id number]    74 y.o. male with significant PMH of diabetes mellitus, HTN, hyperlipidemia, hypothyroidism, depression, and OSA who presented for a left index finger infection. 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   . Blood culture from 10/12 with no growth to date. Left finger wound culture growing Staph aureus and Eikenella corrodens on culture  He received ceftriaxone and metronidazole for a month.  He was initially seen Dr. Grandville Silos in follow-up was quite happy with his progress.   However he then developed worsening of the second finger where he also had a burn that he has been apparently is picking at that area as well.  He also apparently fell on appears to have sustained a fracture in his left wrist is now wearing a cast.  After him seeing Dr. Grandville Silos again he was seen by Dr. Luan Pulling was concerned about infection in this finger and ordered plain films which are read as showing osteolysis of the tuft of the third distal phalanx concerning for Osteomyelitis.  When I initially spoke to Dr. Grandville Silos over the phone he did not think that there is indication for debridement of this finger but he did want to keep the patient on some antibiotics and so we initiated Augmentin which the patient is been taking since then.  He has continued on Augmentin since then and is finger looks improved on exam.  He did bring a CD-ROM with plain films on it but I could not load this on my computer and instead call for [employer].  Dr. Grandville Silos told mea t last visit  that the radiographs showed worsening cortical erosion despite him being on antibiotics and having an apparent clinical response based on his exam.  We continued augmentin  In the  interim.   Since we last saw him he saw Dr. Grandville Silos in early May who got radiographs and felt that the patient had both clinically resolved and also radiographically had improved.  Patient hafremained on Augmentin.  He does not have any pain at the site and it looks encouraging to me.  We will check labs which still showed slightly elevated ESR at 41 but we have stopped abx  He has not had recurrence of pain at his surgical site.  Other fingers have been doing well.  He did cut himself on the volar aspect of his one of his fingers in the left hand but the site does not appear overtly infected see picture below and physical exam    Past Medical History:  Diagnosis Date  . Depression   . Diabetes mellitus without complication ([place])   . Finger infection 12/31/2018  . Hyperlipidemia   . Hypertension   . Hypothyroidism   . Shortness of breath    with exertion  . Sleep apnea    pt is supposed to wear CPAP, but doesn't wear it    Past Surgical History:  Procedure Laterality Date  . AMPUTATION Left 01/01/2019   Procedure: AMPUTATION LEFT INDEX FINGER;  Surgeon: [name], MD;  Location: [place];  Service: Orthopedics;  Laterality: Left;  . CATARACT EXTRACTION W/PHACO Right 11/21/2012   Procedure: CATARACT EXTRACTION PHACO AND INTRAOCULAR LENS PLACEMENT (IOC);  Surgeon:  Gemma Payor, MD;  Location: [facility];  Service: Ophthalmology;  Laterality: Right;  CDE:19.67  . CATARACT EXTRACTION W/PHACO Left 12/19/2012   Procedure: CATARACT EXTRACTION PHACO AND INTRAOCULAR LENS PLACEMENT (IOC);  Surgeon: Gemma Payor, MD;  Location: [facility];  Service: Ophthalmology;  Laterality: Left;  CDE:8.00  . KNEE ARTHROSCOPY Left   . ROTATOR CUFF REPAIR Right     Family History  Problem Relation Age of Onset  . Diabetes Mellitus II Father   . Stroke Father       Social History   Socioeconomic History  . Marital status: Widowed    Spouse name: Not on file  . Number of children: Not on file  . Years  of education: Not on file  . Highest education level: Not on file  Occupational History  . Not on file  Tobacco Use  . Smoking status: Former Smoker    Packs/day: 2.00    Years: 15.00    Pack years: 30.00    Types: Cigarettes  . Smokeless tobacco: Never Used  Vaping Use  . Vaping Use: Never used  Substance and Sexual Activity  . Alcohol use: Yes    Alcohol/week: 2.0 standard drinks    Types: 2 Cans of beer per week  . Drug use: No  . Sexual activity: Not on file  Other Topics Concern  . Not on file  Social History Narrative  . Not on file   Social Determinants of Health   Financial Resource Strain:   . Difficulty of Paying Living Expenses:   Food Insecurity:   . Worried About Programme researcher, broadcasting/film/video in the Last Year:   . Barista in the Last Year:   Transportation Needs:   . Freight forwarder (Medical):   Marland Kitchen Lack of Transportation (Non-Medical):   Physical Activity:   . Days of Exercise per Week:   . Minutes of Exercise per Session:   Stress:   . Feeling of Stress :   Social Connections:   . Frequency of Communication with Friends and Family:   . Frequency of Social Gatherings with Friends and Family:   . Attends Religious Services:   . Active Member of Clubs or Organizations:   . Attends Banker Meetings:   Marland Kitchen Marital Status:     No Known Allergies   Current Outpatient Medications:  .  amLODipine (NORVASC) 10 MG tablet, Take 5 mg by mouth daily. , Disp: , Rfl:  .  aspirin EC 81 MG tablet, Take 81 mg by mouth daily., Disp: , Rfl:  .  atorvastatin (LIPITOR) 20 MG tablet, Take 20 mg by mouth daily., Disp: , Rfl:  .  glipiZIDE (GLUCOTROL XL) 10 MG 24 hr tablet, Take 10 mg by mouth daily., Disp: , Rfl:  .  hydrALAZINE (APRESOLINE) 25 MG tablet, Take 1 tablet (25 mg total) by mouth 3 (three) times daily., Disp: 90 tablet, Rfl: 0 .  insulin glargine (LANTUS) 100 UNIT/ML injection, Inject 50 Units into the skin at bedtime., Disp: , Rfl:  .   insulin lispro (HUMALOG) 100 UNIT/ML injection, Inject 15 Units into the skin 3 (three) times daily after meals. , Disp: , Rfl:  .  levothyroxine (SYNTHROID, LEVOTHROID) 137 MCG tablet, Take 137 mcg by mouth daily before breakfast., Disp: , Rfl:  .  losartan (COZAAR) 100 MG tablet, Take 100 mg by mouth daily. Taking half tablet daily., Disp: , Rfl:  .  metFORMIN (GLUCOPHAGE) 1000 MG tablet, Take 1,000 mg by  mouth 2 (two) times daily with a meal., Disp: , Rfl:  .  mometasone (NASONEX) 50 MCG/ACT nasal spray, Place 2 sprays into the nose daily as needed (for seasonal allergies). , Disp: , Rfl:  .  Naphazoline-Pheniramine (OPCON-A) 0.027-0.315 % SOLN, Place 1-2 drops into both eyes as needed (for itching)., Disp: , Rfl:  .  PARoxetine (PAXIL) 40 MG tablet, Take 20 mg by mouth every morning., Disp: , Rfl:  .  pioglitazone (ACTOS) 30 MG tablet, Take 15 mg by mouth daily. , Disp: , Rfl:  .  pramipexole (MIRAPEX) 1 MG tablet, Take 1 mg by mouth at bedtime., Disp: , Rfl:  .  Semaglutide,0.25 or 0.'5MG'$ /DOS, (OZEMPIC, 0.25 OR 0.5 MG/DOSE,) 2 MG/1.5ML SOPN, Inject 0.5 mg into the skin every Wednesday., Disp: , Rfl:  .  vitamin B-12 (CYANOCOBALAMIN) 1000 MCG tablet, Take 1,000 mcg by mouth daily., Disp: , Rfl:    Past Medical History:  Diagnosis Date  . Depression   . Diabetes mellitus without complication ([place])   . Finger infection 12/31/2018  . Hyperlipidemia   . Hypertension   . Hypothyroidism   . Shortness of breath    with exertion  . Sleep apnea    pt is supposed to wear CPAP, but doesn't wear it    Past Surgical History:  Procedure Laterality Date  . AMPUTATION Left 01/01/2019   Procedure: AMPUTATION LEFT INDEX FINGER;  Surgeon: [name], MD;  Location: [facility];  Service: Orthopedics;  Laterality: Left;  . CATARACT EXTRACTION W/PHACO Right 11/21/2012   Procedure: CATARACT EXTRACTION PHACO AND INTRAOCULAR LENS PLACEMENT (IOC);  Surgeon: Tonny Branch, MD;  Location: [facility];  Service:  Ophthalmology;  Laterality: Right;  CDE:19.67  . CATARACT EXTRACTION W/PHACO Left 12/19/2012   Procedure: CATARACT EXTRACTION PHACO AND INTRAOCULAR LENS PLACEMENT (IOC);  Surgeon: Tonny Branch, MD;  Location: [facility];  Service: Ophthalmology;  Laterality: Left;  CDE:8.00  . KNEE ARTHROSCOPY Left   . ROTATOR CUFF REPAIR Right     Family History  Problem Relation Age of Onset  . Diabetes Mellitus II Father   . Stroke Father       Social History   Socioeconomic History  . Marital status: Widowed    Spouse name: Not on file  . Number of children: Not on file  . Years of education: Not on file  . Highest education level: Not on file  Occupational History  . Not on file  Tobacco Use  . Smoking status: Former Smoker    Packs/day: 2.00    Years: 15.00    Pack years: 30.00    Types: Cigarettes  . Smokeless tobacco: Never Used  Vaping Use  . Vaping Use: Never used  Substance and Sexual Activity  . Alcohol use: Yes    Alcohol/week: 2.0 standard drinks    Types: 2 Cans of beer per week  . Drug use: No  . Sexual activity: Not on file  Other Topics Concern  . Not on file  Social History Narrative  . Not on file   Social Determinants of Health   Financial Resource Strain:   . Difficulty of Paying Living Expenses:   Food Insecurity:   . Worried About Charity fundraiser in the Last Year:   . Arboriculturist in the Last Year:   Transportation Needs:   . Film/video editor (Medical):   Marland Kitchen Lack of Transportation (Non-Medical):   Physical Activity:   . Days of Exercise per Week:   .  Minutes of Exercise per Session:   Stress:   . Feeling of Stress :   Social Connections:   . Frequency of Communication with Friends and Family:   . Frequency of Social Gatherings with Friends and Family:   . Attends Religious Services:   . Active Member of Clubs or Organizations:   . Attends Archivist Meetings:   Marland Kitchen Marital Status:     No Known Allergies   Current Outpatient  Medications:  .  amLODipine (NORVASC) 10 MG tablet, Take 5 mg by mouth daily. , Disp: , Rfl:  .  aspirin EC 81 MG tablet, Take 81 mg by mouth daily., Disp: , Rfl:  .  atorvastatin (LIPITOR) 20 MG tablet, Take 20 mg by mouth daily., Disp: , Rfl:  .  glipiZIDE (GLUCOTROL XL) 10 MG 24 hr tablet, Take 10 mg by mouth daily., Disp: , Rfl:  .  hydrALAZINE (APRESOLINE) 25 MG tablet, Take 1 tablet (25 mg total) by mouth 3 (three) times daily., Disp: 90 tablet, Rfl: 0 .  insulin glargine (LANTUS) 100 UNIT/ML injection, Inject 50 Units into the skin at bedtime., Disp: , Rfl:  .  insulin lispro (HUMALOG) 100 UNIT/ML injection, Inject 15 Units into the skin 3 (three) times daily after meals. , Disp: , Rfl:  .  levothyroxine (SYNTHROID, LEVOTHROID) 137 MCG tablet, Take 137 mcg by mouth daily before breakfast., Disp: , Rfl:  .  losartan (COZAAR) 100 MG tablet, Take 100 mg by mouth daily. Taking half tablet daily., Disp: , Rfl:  .  metFORMIN (GLUCOPHAGE) 1000 MG tablet, Take 1,000 mg by mouth 2 (two) times daily with a meal., Disp: , Rfl:  .  mometasone (NASONEX) 50 MCG/ACT nasal spray, Place 2 sprays into the nose daily as needed (for seasonal allergies). , Disp: , Rfl:  .  Naphazoline-Pheniramine (OPCON-A) 0.027-0.315 % SOLN, Place 1-2 drops into both eyes as needed (for itching)., Disp: , Rfl:  .  PARoxetine (PAXIL) 40 MG tablet, Take 20 mg by mouth every morning., Disp: , Rfl:  .  pioglitazone (ACTOS) 30 MG tablet, Take 15 mg by mouth daily. , Disp: , Rfl:  .  pramipexole (MIRAPEX) 1 MG tablet, Take 1 mg by mouth at bedtime., Disp: , Rfl:  .  Semaglutide,0.25 or 0.'5MG'$ /DOS, (OZEMPIC, 0.25 OR 0.5 MG/DOSE,) 2 MG/1.5ML SOPN, Inject 0.5 mg into the skin every Wednesday., Disp: , Rfl:  .  vitamin B-12 (CYANOCOBALAMIN) 1000 MCG tablet, Take 1,000 mcg by mouth daily., Disp: , Rfl:    Review of Systems  Constitutional: Negative for chills and fever.  HENT: Negative for congestion and sore throat.   Eyes: Negative  for photophobia.  Respiratory: Negative for cough, shortness of breath and wheezing.   Cardiovascular: Negative for chest pain, palpitations and leg swelling.  Gastrointestinal: Negative for abdominal pain, blood in stool, constipation, diarrhea, nausea and vomiting.  Genitourinary: Negative for dysuria, flank pain and hematuria.  Musculoskeletal: Negative for back pain and myalgias.  Skin: Positive for wound. Negative for rash.  Neurological: Negative for dizziness, speech difficulty, weakness and headaches.  Hematological: Does not bruise/bleed easily.  Psychiatric/Behavioral: Negative for suicidal ideas.       Objective:   Physical Exam Constitutional:      General: He is not in acute distress.    Appearance: Normal appearance. He is well-developed. He is not ill-appearing or diaphoretic.  HENT:     Head: Normocephalic and atraumatic.     Right Ear: Hearing and external ear normal.  Left Ear: Hearing and external ear normal.     Nose: No nasal deformity or rhinorrhea.  Eyes:     General: No scleral icterus.    Extraocular Movements: Extraocular movements intact.     Conjunctiva/sclera: Conjunctivae normal.     Right eye: Right conjunctiva is not injected.     Left eye: Left conjunctiva is not injected.  Neck:     Vascular: No JVD.  Cardiovascular:     Rate and Rhythm: Normal rate and regular rhythm.     Heart sounds: S1 normal and S2 normal.  Abdominal:     General: There is no distension.     Palpations: Abdomen is soft.  Musculoskeletal:        General: Deformity present. Normal range of motion.     Right shoulder: Normal.     Left shoulder: Normal.     Cervical back: Normal range of motion and neck supple.     Right hip: Normal.     Left hip: Normal.     Right knee: Normal.     Left knee: Normal.  Lymphadenopathy:     Head:     Right side of head: No submandibular, preauricular or posterior auricular adenopathy.     Left side of head: No submandibular,  preauricular or posterior auricular adenopathy.     Cervical: No cervical adenopathy.     Right cervical: No superficial or deep cervical adenopathy.    Left cervical: No superficial or deep cervical adenopathy.  Skin:    General: Skin is warm and dry.     Coloration: Skin is not pale.     Findings: No abrasion, bruising, ecchymosis, erythema, lesion or rash.     Nails: There is no clubbing.  Neurological:     General: No focal deficit present.     Mental Status: He is alert and oriented to person, place, and time.     Sensory: No sensory deficit.     Coordination: Coordination normal.     Gait: Gait normal.  Psychiatric:        Attention and Perception: He is attentive.        Mood and Affect: Mood normal.        Speech: Speech normal.        Behavior: Behavior normal. Behavior is cooperative.        Thought Content: Thought content normal.        Judgment: Judgment normal.      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  Plain films were  reassuring.  Will recheck inflammatory markers again today  Laceration: Not appear infected overtly.  I suggest that he look into cutting devices for his vegetables that put him at less risk of cutting himself given his neuropathy and risk for infection.  Covid vaccination status: He has had his Covid vaccines  Screen for hepatitis C  and HIV will screen for both of these today as they have not been done in our  electronic medical record.

## 2019-11-04 LAB — BASIC METABOLIC PANEL WITH GFR
BUN/Creatinine Ratio: 19 (calc) (ref 6–22)
BUN: 23 mg/dL (ref 7–25)
CO2: 28 mmol/L (ref 20–32)
Calcium: 8.9 mg/dL (ref 8.6–10.3)
Chloride: 101 mmol/L (ref 98–110)
Creat: 1.19 mg/dL — ABNORMAL HIGH (ref 0.70–1.18)
GFR, Est African American: 70 mL/min/{1.73_m2} (ref >=60)
GFR, Est Non African American: 60 mL/min/{1.73_m2} (ref >=60)
Glucose, Bld: 120 mg/dL — ABNORMAL HIGH (ref 65–99)
Potassium: 4.8 mmol/L (ref 3.5–5.3)
Sodium: 136 mmol/L (ref 135–146)

## 2019-11-04 LAB — HIV ANTIBODY (ROUTINE TESTING W REFLEX): HIV 1&2 Ab, 4th Generation: NONREACTIVE

## 2019-11-04 LAB — SEDIMENTATION RATE: Sed Rate: 38 mm/h — ABNORMAL HIGH (ref 0–20)

## 2019-11-04 LAB — HEPATITIS C ANTIBODY
Hepatitis C Ab: NONREACTIVE
SIGNAL TO CUT-OFF: 0.01 (ref <1.00)

## 2019-11-04 LAB — C-REACTIVE PROTEIN: CRP: 4.3 mg/L (ref <8.0)

## 2019-11-18 DIAGNOSIS — G4733 Obstructive sleep apnea (adult) (pediatric): Secondary | ICD-10-CM | POA: Diagnosis not present

## 2020-01-20 DIAGNOSIS — E039 Hypothyroidism, unspecified: Secondary | ICD-10-CM | POA: Diagnosis not present

## 2020-01-20 DIAGNOSIS — E1165 Type 2 diabetes mellitus with hyperglycemia: Secondary | ICD-10-CM | POA: Diagnosis not present

## 2020-01-20 DIAGNOSIS — E782 Mixed hyperlipidemia: Secondary | ICD-10-CM | POA: Diagnosis not present

## 2020-01-20 DIAGNOSIS — Z794 Long term (current) use of insulin: Secondary | ICD-10-CM | POA: Diagnosis not present

## 2020-01-20 DIAGNOSIS — I10 Essential (primary) hypertension: Secondary | ICD-10-CM | POA: Diagnosis not present

## 2020-01-22 DIAGNOSIS — H3582 Retinal ischemia: Secondary | ICD-10-CM | POA: Diagnosis not present

## 2020-01-22 DIAGNOSIS — E113313 Type 2 diabetes mellitus with moderate nonproliferative diabetic retinopathy with macular edema, bilateral: Secondary | ICD-10-CM | POA: Diagnosis not present

## 2020-01-22 DIAGNOSIS — D3131 Benign neoplasm of right choroid: Secondary | ICD-10-CM | POA: Diagnosis not present

## 2020-01-22 DIAGNOSIS — H43813 Vitreous degeneration, bilateral: Secondary | ICD-10-CM | POA: Diagnosis not present

## 2020-01-23 DIAGNOSIS — Z794 Long term (current) use of insulin: Secondary | ICD-10-CM | POA: Diagnosis not present

## 2020-01-23 DIAGNOSIS — E1165 Type 2 diabetes mellitus with hyperglycemia: Secondary | ICD-10-CM | POA: Diagnosis not present

## 2020-01-23 DIAGNOSIS — E113393 Type 2 diabetes mellitus with moderate nonproliferative diabetic retinopathy without macular edema, bilateral: Secondary | ICD-10-CM | POA: Diagnosis not present

## 2020-01-23 DIAGNOSIS — E039 Hypothyroidism, unspecified: Secondary | ICD-10-CM | POA: Diagnosis not present

## 2020-01-23 DIAGNOSIS — E1142 Type 2 diabetes mellitus with diabetic polyneuropathy: Secondary | ICD-10-CM | POA: Diagnosis not present

## 2020-01-23 DIAGNOSIS — E538 Deficiency of other specified B group vitamins: Secondary | ICD-10-CM | POA: Diagnosis not present

## 2020-01-23 DIAGNOSIS — Z6841 Body Mass Index (BMI) 40.0 and over, adult: Secondary | ICD-10-CM | POA: Diagnosis not present

## 2020-01-23 DIAGNOSIS — E782 Mixed hyperlipidemia: Secondary | ICD-10-CM | POA: Diagnosis not present

## 2020-01-23 DIAGNOSIS — I10 Essential (primary) hypertension: Secondary | ICD-10-CM | POA: Diagnosis not present

## 2020-01-27 DIAGNOSIS — E785 Hyperlipidemia, unspecified: Secondary | ICD-10-CM | POA: Diagnosis not present

## 2020-01-27 DIAGNOSIS — Z23 Encounter for immunization: Secondary | ICD-10-CM | POA: Diagnosis not present

## 2020-01-27 DIAGNOSIS — R0602 Shortness of breath: Secondary | ICD-10-CM | POA: Diagnosis not present

## 2020-01-27 DIAGNOSIS — F331 Major depressive disorder, recurrent, moderate: Secondary | ICD-10-CM | POA: Diagnosis not present

## 2020-01-27 DIAGNOSIS — D519 Vitamin B12 deficiency anemia, unspecified: Secondary | ICD-10-CM | POA: Diagnosis not present

## 2020-01-27 DIAGNOSIS — G2581 Restless legs syndrome: Secondary | ICD-10-CM | POA: Diagnosis not present

## 2020-01-27 DIAGNOSIS — I10 Essential (primary) hypertension: Secondary | ICD-10-CM | POA: Diagnosis not present

## 2020-01-27 DIAGNOSIS — E039 Hypothyroidism, unspecified: Secondary | ICD-10-CM | POA: Diagnosis not present

## 2020-01-27 DIAGNOSIS — E1165 Type 2 diabetes mellitus with hyperglycemia: Secondary | ICD-10-CM | POA: Diagnosis not present

## 2020-03-19 DIAGNOSIS — E785 Hyperlipidemia, unspecified: Secondary | ICD-10-CM | POA: Diagnosis not present

## 2020-03-19 DIAGNOSIS — R0602 Shortness of breath: Secondary | ICD-10-CM | POA: Diagnosis not present

## 2020-03-19 DIAGNOSIS — D519 Vitamin B12 deficiency anemia, unspecified: Secondary | ICD-10-CM | POA: Diagnosis not present

## 2020-03-19 DIAGNOSIS — F331 Major depressive disorder, recurrent, moderate: Secondary | ICD-10-CM | POA: Diagnosis not present

## 2020-03-19 DIAGNOSIS — E1165 Type 2 diabetes mellitus with hyperglycemia: Secondary | ICD-10-CM | POA: Diagnosis not present

## 2020-03-19 DIAGNOSIS — I10 Essential (primary) hypertension: Secondary | ICD-10-CM | POA: Diagnosis not present

## 2020-03-19 DIAGNOSIS — E039 Hypothyroidism, unspecified: Secondary | ICD-10-CM | POA: Diagnosis not present

## 2020-03-19 DIAGNOSIS — Z23 Encounter for immunization: Secondary | ICD-10-CM | POA: Diagnosis not present

## 2020-03-19 DIAGNOSIS — G2581 Restless legs syndrome: Secondary | ICD-10-CM | POA: Diagnosis not present

## 2020-04-17 DIAGNOSIS — I10 Essential (primary) hypertension: Secondary | ICD-10-CM | POA: Diagnosis not present

## 2020-04-17 DIAGNOSIS — E039 Hypothyroidism, unspecified: Secondary | ICD-10-CM | POA: Diagnosis not present

## 2020-04-17 DIAGNOSIS — E785 Hyperlipidemia, unspecified: Secondary | ICD-10-CM | POA: Diagnosis not present

## 2020-04-17 DIAGNOSIS — D519 Vitamin B12 deficiency anemia, unspecified: Secondary | ICD-10-CM | POA: Diagnosis not present

## 2020-04-17 DIAGNOSIS — G2581 Restless legs syndrome: Secondary | ICD-10-CM | POA: Diagnosis not present

## 2020-04-17 DIAGNOSIS — Z23 Encounter for immunization: Secondary | ICD-10-CM | POA: Diagnosis not present

## 2020-04-17 DIAGNOSIS — F331 Major depressive disorder, recurrent, moderate: Secondary | ICD-10-CM | POA: Diagnosis not present

## 2020-04-17 DIAGNOSIS — R0602 Shortness of breath: Secondary | ICD-10-CM | POA: Diagnosis not present

## 2020-04-17 DIAGNOSIS — E1165 Type 2 diabetes mellitus with hyperglycemia: Secondary | ICD-10-CM | POA: Diagnosis not present

## 2020-04-27 DIAGNOSIS — E782 Mixed hyperlipidemia: Secondary | ICD-10-CM | POA: Diagnosis not present

## 2020-04-27 DIAGNOSIS — Z794 Long term (current) use of insulin: Secondary | ICD-10-CM | POA: Diagnosis not present

## 2020-04-27 DIAGNOSIS — E1165 Type 2 diabetes mellitus with hyperglycemia: Secondary | ICD-10-CM | POA: Diagnosis not present

## 2020-04-27 DIAGNOSIS — I10 Essential (primary) hypertension: Secondary | ICD-10-CM | POA: Diagnosis not present

## 2020-04-30 DIAGNOSIS — E113393 Type 2 diabetes mellitus with moderate nonproliferative diabetic retinopathy without macular edema, bilateral: Secondary | ICD-10-CM | POA: Diagnosis not present

## 2020-04-30 DIAGNOSIS — E538 Deficiency of other specified B group vitamins: Secondary | ICD-10-CM | POA: Diagnosis not present

## 2020-04-30 DIAGNOSIS — Z794 Long term (current) use of insulin: Secondary | ICD-10-CM | POA: Diagnosis not present

## 2020-04-30 DIAGNOSIS — I10 Essential (primary) hypertension: Secondary | ICD-10-CM | POA: Diagnosis not present

## 2020-04-30 DIAGNOSIS — E039 Hypothyroidism, unspecified: Secondary | ICD-10-CM | POA: Diagnosis not present

## 2020-04-30 DIAGNOSIS — E782 Mixed hyperlipidemia: Secondary | ICD-10-CM | POA: Diagnosis not present

## 2020-04-30 DIAGNOSIS — Z6841 Body Mass Index (BMI) 40.0 and over, adult: Secondary | ICD-10-CM | POA: Diagnosis not present

## 2020-04-30 DIAGNOSIS — E1142 Type 2 diabetes mellitus with diabetic polyneuropathy: Secondary | ICD-10-CM | POA: Diagnosis not present

## 2020-04-30 DIAGNOSIS — E1165 Type 2 diabetes mellitus with hyperglycemia: Secondary | ICD-10-CM | POA: Diagnosis not present

## 2020-05-27 DIAGNOSIS — G4733 Obstructive sleep apnea (adult) (pediatric): Secondary | ICD-10-CM | POA: Diagnosis not present

## 2020-05-27 DIAGNOSIS — D519 Vitamin B12 deficiency anemia, unspecified: Secondary | ICD-10-CM | POA: Diagnosis not present

## 2020-05-27 DIAGNOSIS — Z6841 Body Mass Index (BMI) 40.0 and over, adult: Secondary | ICD-10-CM | POA: Diagnosis not present

## 2020-05-27 DIAGNOSIS — E039 Hypothyroidism, unspecified: Secondary | ICD-10-CM | POA: Diagnosis not present

## 2020-05-27 DIAGNOSIS — R0602 Shortness of breath: Secondary | ICD-10-CM | POA: Diagnosis not present

## 2020-05-27 DIAGNOSIS — I10 Essential (primary) hypertension: Secondary | ICD-10-CM | POA: Diagnosis not present

## 2020-05-27 DIAGNOSIS — F331 Major depressive disorder, recurrent, moderate: Secondary | ICD-10-CM | POA: Diagnosis not present

## 2020-05-27 DIAGNOSIS — E1165 Type 2 diabetes mellitus with hyperglycemia: Secondary | ICD-10-CM | POA: Diagnosis not present

## 2020-05-27 DIAGNOSIS — G2581 Restless legs syndrome: Secondary | ICD-10-CM | POA: Diagnosis not present

## 2020-05-27 DIAGNOSIS — E785 Hyperlipidemia, unspecified: Secondary | ICD-10-CM | POA: Diagnosis not present

## 2020-06-16 DIAGNOSIS — G4733 Obstructive sleep apnea (adult) (pediatric): Secondary | ICD-10-CM | POA: Diagnosis not present

## 2020-06-16 DIAGNOSIS — R0602 Shortness of breath: Secondary | ICD-10-CM | POA: Diagnosis not present

## 2020-06-16 DIAGNOSIS — F331 Major depressive disorder, recurrent, moderate: Secondary | ICD-10-CM | POA: Diagnosis not present

## 2020-06-16 DIAGNOSIS — I10 Essential (primary) hypertension: Secondary | ICD-10-CM | POA: Diagnosis not present

## 2020-06-16 DIAGNOSIS — E1165 Type 2 diabetes mellitus with hyperglycemia: Secondary | ICD-10-CM | POA: Diagnosis not present

## 2020-06-16 DIAGNOSIS — E785 Hyperlipidemia, unspecified: Secondary | ICD-10-CM | POA: Diagnosis not present

## 2020-06-16 DIAGNOSIS — D519 Vitamin B12 deficiency anemia, unspecified: Secondary | ICD-10-CM | POA: Diagnosis not present

## 2020-06-16 DIAGNOSIS — E039 Hypothyroidism, unspecified: Secondary | ICD-10-CM | POA: Diagnosis not present

## 2020-06-16 DIAGNOSIS — G2581 Restless legs syndrome: Secondary | ICD-10-CM | POA: Diagnosis not present

## 2020-07-08 DIAGNOSIS — G4733 Obstructive sleep apnea (adult) (pediatric): Secondary | ICD-10-CM | POA: Diagnosis not present

## 2020-07-13 DIAGNOSIS — E113311 Type 2 diabetes mellitus with moderate nonproliferative diabetic retinopathy with macular edema, right eye: Secondary | ICD-10-CM | POA: Diagnosis not present

## 2020-07-13 DIAGNOSIS — E113292 Type 2 diabetes mellitus with mild nonproliferative diabetic retinopathy without macular edema, left eye: Secondary | ICD-10-CM | POA: Diagnosis not present

## 2020-07-13 DIAGNOSIS — H43813 Vitreous degeneration, bilateral: Secondary | ICD-10-CM | POA: Diagnosis not present

## 2020-07-13 DIAGNOSIS — H31091 Other chorioretinal scars, right eye: Secondary | ICD-10-CM | POA: Diagnosis not present

## 2020-07-18 DIAGNOSIS — I10 Essential (primary) hypertension: Secondary | ICD-10-CM | POA: Diagnosis not present

## 2020-07-18 DIAGNOSIS — E1165 Type 2 diabetes mellitus with hyperglycemia: Secondary | ICD-10-CM | POA: Diagnosis not present

## 2020-07-27 DIAGNOSIS — E538 Deficiency of other specified B group vitamins: Secondary | ICD-10-CM | POA: Diagnosis not present

## 2020-07-27 DIAGNOSIS — E1165 Type 2 diabetes mellitus with hyperglycemia: Secondary | ICD-10-CM | POA: Diagnosis not present

## 2020-07-27 DIAGNOSIS — Z794 Long term (current) use of insulin: Secondary | ICD-10-CM | POA: Diagnosis not present

## 2020-07-27 DIAGNOSIS — E782 Mixed hyperlipidemia: Secondary | ICD-10-CM | POA: Diagnosis not present

## 2020-07-27 DIAGNOSIS — I10 Essential (primary) hypertension: Secondary | ICD-10-CM | POA: Diagnosis not present

## 2020-07-27 LAB — BASIC METABOLIC PANEL
BUN: 22 — AB (ref 4–21)
Creatinine: 1.1 (ref 0.6–1.3)
Glucose: 131

## 2020-07-27 LAB — LIPID PANEL
LDL Cholesterol: 55
Triglycerides: 52 (ref 40–160)

## 2020-07-27 LAB — HEMOGLOBIN A1C: Hemoglobin A1C: 6.8

## 2020-07-27 LAB — COMPREHENSIVE METABOLIC PANEL: GFR calc non Af Amer: 73

## 2020-07-30 DIAGNOSIS — Z794 Long term (current) use of insulin: Secondary | ICD-10-CM | POA: Diagnosis not present

## 2020-07-30 DIAGNOSIS — E039 Hypothyroidism, unspecified: Secondary | ICD-10-CM | POA: Diagnosis not present

## 2020-07-30 DIAGNOSIS — E113393 Type 2 diabetes mellitus with moderate nonproliferative diabetic retinopathy without macular edema, bilateral: Secondary | ICD-10-CM | POA: Diagnosis not present

## 2020-07-30 DIAGNOSIS — E1165 Type 2 diabetes mellitus with hyperglycemia: Secondary | ICD-10-CM | POA: Diagnosis not present

## 2020-07-30 DIAGNOSIS — E538 Deficiency of other specified B group vitamins: Secondary | ICD-10-CM | POA: Diagnosis not present

## 2020-07-30 DIAGNOSIS — E1142 Type 2 diabetes mellitus with diabetic polyneuropathy: Secondary | ICD-10-CM | POA: Diagnosis not present

## 2020-07-30 DIAGNOSIS — I10 Essential (primary) hypertension: Secondary | ICD-10-CM | POA: Diagnosis not present

## 2020-07-30 DIAGNOSIS — E782 Mixed hyperlipidemia: Secondary | ICD-10-CM | POA: Diagnosis not present

## 2020-08-07 DIAGNOSIS — G4733 Obstructive sleep apnea (adult) (pediatric): Secondary | ICD-10-CM | POA: Diagnosis not present

## 2020-08-23 ENCOUNTER — Encounter: Payer: Self-pay | Admitting: Nurse Practitioner

## 2020-08-23 ENCOUNTER — Other Ambulatory Visit: Payer: Self-pay

## 2020-08-23 ENCOUNTER — Ambulatory Visit: Payer: PPO | Admitting: Nurse Practitioner

## 2020-08-23 VITALS — BP 177/66 | HR 67 | Ht 73.0 in | Wt >= 6400 oz

## 2020-08-23 DIAGNOSIS — I10 Essential (primary) hypertension: Secondary | ICD-10-CM

## 2020-08-23 DIAGNOSIS — E039 Hypothyroidism, unspecified: Secondary | ICD-10-CM | POA: Diagnosis not present

## 2020-08-23 DIAGNOSIS — E119 Type 2 diabetes mellitus without complications: Secondary | ICD-10-CM | POA: Diagnosis not present

## 2020-08-23 DIAGNOSIS — E782 Mixed hyperlipidemia: Secondary | ICD-10-CM

## 2020-08-23 MED ORDER — GLIPIZIDE ER 5 MG PO TB24: 5.0000 mg | ORAL_TABLET | Freq: Every day | ORAL | 3 refills | Status: DC

## 2020-08-23 NOTE — Progress Notes (Addendum)
Endocrinology Consult Note       08/23/2020, 11:43 AM   Subjective:    Patient ID: Ronald Heath, male    DOB: 20-Nov-1945.  Ronald Heath is being seen in consultation for management of currently uncontrolled symptomatic diabetes requested by  Celene Squibb, MD.  He is transferring care from his previous Endocrinologist Dr. Elyse Hsu who is retiring.   Past Medical History:  Diagnosis Date  . Depression   . Diabetes mellitus without complication ([place])   . Finger infection 12/31/2018  . Hyperlipidemia   . Hypertension   . Hypothyroidism   . Shortness of breath    with exertion  . Sleep apnea    pt is supposed to wear CPAP, but doesn't wear it    Past Surgical History:  Procedure Laterality Date  . AMPUTATION Left 01/01/2019   Procedure: AMPUTATION LEFT INDEX FINGER;  Surgeon: [name], MD;  Location: [facility];  Service: Orthopedics;  Laterality: Left;  . CATARACT EXTRACTION W/PHACO Right 11/21/2012   Procedure: CATARACT EXTRACTION PHACO AND INTRAOCULAR LENS PLACEMENT (IOC);  Surgeon: Tonny Branch, MD;  Location: [facility];  Service: Ophthalmology;  Laterality: Right;  CDE:19.67  . CATARACT EXTRACTION W/PHACO Left 12/19/2012   Procedure: CATARACT EXTRACTION PHACO AND INTRAOCULAR LENS PLACEMENT (IOC);  Surgeon: Tonny Branch, MD;  Location: [facility];  Service: Ophthalmology;  Laterality: Left;  CDE:8.00  . KNEE ARTHROSCOPY Left   . ROTATOR CUFF REPAIR Right     Social History   Socioeconomic History  . Marital status: Widowed    Spouse name: Not on file  . Number of children: Not on file  . Years of education: Not on file  . Highest education level: Not on file  Occupational History  . Not on file  Tobacco Use  . Smoking status: Former Smoker    Packs/day: 2.00    Years: 15.00    Pack years: 30.00    Types: Cigarettes  . Smokeless tobacco: Never Used  Vaping Use  . Vaping Use: Never used   Substance and Sexual Activity  . Alcohol use: Yes    Alcohol/week: 2.0 standard drinks    Types: 2 Cans of beer per week  . Drug use: No  . Sexual activity: Not on file  Other Topics Concern  . Not on file  Social History Narrative  . Not on file   Social Determinants of Health   Financial Resource Strain: Not on file  Food Insecurity: Not on file  Transportation Needs: Not on file  Physical Activity: Not on file  Stress: Not on file  Social Connections: Not on file    Family History  Problem Relation Age of Onset  . Diabetes Mellitus II Father   . Stroke Father     Outpatient Encounter Medications as of 08/23/2020  Medication Sig  . amLODipine (NORVASC) 10 MG tablet Take 5 mg by mouth daily.   Marland Kitchen aspirin EC 81 MG tablet Take 81 mg by mouth daily.  Marland Kitchen atorvastatin (LIPITOR) 20 MG tablet Take 20 mg by mouth daily.  . hydrochlorothiazide (HYDRODIURIL) 25 MG tablet Take 1 tablet by mouth daily.  . insulin glargine (LANTUS) 100 UNIT/ML injection Inject  50 Units into the skin at bedtime.  . insulin lispro (HUMALOG) 100 UNIT/ML injection Inject 12-18 Units into the skin 3 (three) times daily after meals.  Marland Kitchen levothyroxine (SYNTHROID, LEVOTHROID) 137 MCG tablet Take 137 mcg by mouth daily before breakfast.  . losartan (COZAAR) 100 MG tablet Take 100 mg by mouth daily. Taking half tablet daily.  . metFORMIN (GLUCOPHAGE) 1000 MG tablet Take 1,000 mg by mouth 2 (two) times daily with a meal.  . PARoxetine (PAXIL) 40 MG tablet Take 20 mg by mouth every morning.  . pramipexole (MIRAPEX) 1 MG tablet Take 1 mg by mouth at bedtime.  . Semaglutide,0.25 or 0.5MG/DOS, (OZEMPIC, 0.25 OR 0.5 MG/DOSE,) 2 MG/1.5ML SOPN Inject 0.5 mg into the skin every Wednesday.  . vitamin B-12 (CYANOCOBALAMIN) 1000 MCG tablet Take 1,000 mcg by mouth daily.  . [DISCONTINUED] glipiZIDE (GLUCOTROL XL) 10 MG 24 hr tablet Take 5 mg by mouth daily.  . [DISCONTINUED] pioglitazone (ACTOS) 30 MG tablet Take 15 mg by mouth  daily.   Marland Kitchen glipiZIDE (GLUCOTROL XL) 5 MG 24 hr tablet Take 1 tablet (5 mg total) by mouth daily with breakfast.  . [DISCONTINUED] hydrALAZINE (APRESOLINE) 25 MG tablet Take 1 tablet (25 mg total) by mouth 3 (three) times daily.  . [DISCONTINUED] mometasone (NASONEX) 50 MCG/ACT nasal spray Place 2 sprays into the nose daily as needed (for seasonal allergies).   . [DISCONTINUED] Naphazoline-Pheniramine (OPCON-A) 0.027-0.315 % SOLN Place 1-2 drops into both eyes as needed (for itching). (Patient not taking: Reported on 11/03/2019)   No facility-administered encounter medications on file as of 08/23/2020.    ALLERGIES: No Known Allergies  VACCINATION STATUS: Immunization History  Administered Date(s) Administered  . Influenza, High Dose Seasonal PF 01/16/2018  . Tdap 12/30/2018    Diabetes He presents for his initial diabetic visit. He has type 2 diabetes mellitus. Onset time: Diagnosed at approx age of 89. His disease course has been improving. Hypoglycemia symptoms include sweats. Pertinent negatives for hypoglycemia include no nervousness/anxiousness or tremors. Associated symptoms include foot paresthesias. Pertinent negatives for diabetes include no fatigue. There are no hypoglycemic complications. Symptoms are stable. Diabetic complications include nephropathy, peripheral neuropathy and retinopathy. Risk factors for coronary artery disease include diabetes mellitus, dyslipidemia, family history, hypertension, male sex, obesity and sedentary lifestyle. Current diabetic treatment includes intensive insulin program and oral agent (triple therapy) (Lantus 50, Humalog 15 TID, Glip 10 XL, Met 1000 BID, Actos 15, Ozempic 1 mg). He is compliant with treatment most of the time. His weight is increasing steadily. He is following a generally unhealthy diet. When asked about meal planning, he reported none. He has not had a previous visit with a dietitian. He never participates in exercise. (He presents today  for his consultation, accompanied by his son, with no meter or logs to review.  His most recent A1c was 6.8% on 07/27/20.  He does not routinely monitor glucose at home, says he has a meter but does not know where it is.  He admits to drinking lots of coffee (with cream and sugar), diet soda, mild, and beer (approx 5 per week).  He does not have any routine eating pattern, will skip meals at times and does consume snacks.  He does not engage in routine physical activity due to right knee pain (needing replacement but surgeon will not operate at his current weight).  He does have occasional symptoms of hypoglycemia randomly.) An ACE inhibitor/angiotensin II receptor blocker is being taken. He sees a podiatrist.Eye exam is  current.  Hyperlipidemia This is a chronic problem. The current episode started more than 1 year ago. The problem is controlled. Recent lipid tests were reviewed and are Ronald. Exacerbating diseases include chronic renal disease, diabetes, hypothyroidism and obesity. Factors aggravating his hyperlipidemia include fatty foods and thiazides. Current antihyperlipidemic treatment includes statins. The current treatment provides significant improvement of lipids. Compliance problems include adherence to diet and adherence to exercise.  Risk factors for coronary artery disease include diabetes mellitus, dyslipidemia, hypertension, male sex, obesity and a sedentary lifestyle.  Hypertension This is a chronic problem. The current episode started more than 1 year ago. The problem has been waxing and waning since onset. The problem is uncontrolled. Associated symptoms include peripheral edema and sweats. Agents associated with hypertension include thyroid hormones. Risk factors for coronary artery disease include diabetes mellitus, dyslipidemia, family history, obesity, male gender and sedentary lifestyle. Past treatments include calcium channel blockers, diuretics and angiotensin blockers. The current  treatment provides mild improvement. Compliance problems include diet and exercise.  Hypertensive end-organ damage includes kidney disease and retinopathy. Identifiable causes of hypertension include chronic renal disease and a thyroid problem.  Thyroid Problem Presents for follow-up visit. Symptoms include weight gain. Patient reports no anxiety, fatigue or tremors. The symptoms have been stable. His past medical history is significant for diabetes and hyperlipidemia.     Review of systems  Constitutional: + steadily increasing body weight, current Body mass index is 57.13 kg/m., + fatigue, no subjective hyperthermia, no subjective hypothermia Eyes: no blurry vision, no xerophthalmia ENT: no sore throat, no nodules palpated in throat, no dysphagia/odynophagia, no hoarseness Cardiovascular: no chest pain, no shortness of breath, no palpitations, + ankle swelling Respiratory: no cough, no shortness of breath Gastrointestinal: no nausea/vomiting/diarrhea Musculoskeletal: right knee pain (needs TKR) Skin: no rashes, no hyperemia Neurological: no tremors, no numbness, no tingling, no dizziness Psychiatric: no depression, no anxiety  Objective:     BP (!) 177/66   Pulse 67   Ht _0  (1.854 m)   Wt (!) 433 lb (196.4 kg)   BMI 57.13 kg/m   Wt Readings from Last 3 Encounters:  08/23/20 (!) 433 lb (196.4 kg)  11/03/19 (!) 427 lb (193.7 kg)  09/01/19 (!) 417 lb (189.1 kg)     BP Readings from Last 3 Encounters:  08/23/20 (!) 177/66  11/03/19 (!) 187/70  09/01/19 (!) 173/86     Physical Exam- Limited  Constitutional:  Body mass index is 57.13 kg/m. , not in acute distress, Ronald state of mind, reluctant affect Eyes:  EOMI, no exophthalmos Neck: Supple Cardiovascular: RRR, no murmurs, rubs, or gallops, nonpitting edema to BLE (ankles) Respiratory: Adequate breathing efforts, no crackles, rales, rhonchi, or wheezing Musculoskeletal: missing left 1st digit on his hand from  previous accident, strength intact in all four extremities, no gross restriction of joint movements Skin:  no rashes, no hyperemia Neurological: no tremor with outstretched hands    CMP ( most recent) CMP     Component Value Date/Time   NA 136 11/03/2019 1035   K 4.8 11/03/2019 1035   CL 101 11/03/2019 1035   CO2 28 11/03/2019 1035   GLUCOSE 120 (H) 11/03/2019 1035   BUN 23 11/03/2019 1035   CREATININE 1.19 (H) 11/03/2019 1035   CALCIUM 8.9 11/03/2019 1035   PROT 6.5 01/29/2019 1130   ALBUMIN 3.1 (L) 01/29/2019 1130   AST 17 01/29/2019 1130   ALT 16 01/29/2019 1130   ALKPHOS 62 01/29/2019 1130   BILITOT 0.6  01/29/2019 1130   GFRNONAA 60 11/03/2019 1035   GFRAA 70 11/03/2019 1035     Diabetic Labs (most recent): Lab Results  Component Value Date   HGBA1C 6.9 (H) 12/31/2018   HGBA1C 6.8 (H) 12/30/2018     Lipid Panel ( most recent) Lipid Panel  No results found for: CHOL, TRIG, HDL, CHOLHDL, VLDL, LDLCALC, LDLDIRECT, LABVLDL    No results found for: TSH, FREET4         Assessment & Plan:   1) Diabetes mellitus without complication ([place])  He presents today for his consultation, accompanied by his son, with no meter or logs to review.  His most recent A1c was 6.8% on 07/27/20.  He does not routinely monitor glucose at home, says he has a meter but does not know where it is.  He admits to drinking lots of coffee (with cream and sugar), diet soda, mild, and beer (approx 5 per week).  He does not have any routine eating pattern, will skip meals at times and does consume snacks.  He does not engage in routine physical activity due to right knee pain (needing replacement but surgeon will not operate at his current weight).  He does have occasional symptoms of hypoglycemia randomly.  - Ronald Heath has currently uncontrolled symptomatic type 2 DM since 75 years of age, with most recent A1c of 6.8 %.   -Recent labs reviewed.  - I had a long discussion with him about  the progressive nature of diabetes and the pathology behind its complications. 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 These are all discussed in detail with him.  - I have counseled him on diet and weight management by adopting a carbohydrate restricted/protein rich diet. Patient is encouraged to switch to unprocessed or minimally processed complex starch and increased protein intake (animal or plant source), fruits, and vegetables. -  he is advised to stick to a routine mealtimes to eat 3 meals a day and avoid unnecessary snacks (to snack only to correct hypoglycemia).   - he acknowledges that there is a room for improvement in his food and drink choices. - Suggestion is made for him to avoid simple carbohydrates from his diet including Cakes, Sweet Desserts, Ice Cream, Soda (diet and regular), Sweet Tea, Candies, Chips, Cookies, Store Bought Juices, Alcohol in Excess of 1-2 drinks a day, Artificial Sweeteners, Coffee Creamer, and "Sugar-free" Products. This will help patient to have more stable blood glucose profile and potentially avoid unintended weight gain.  - he will be scheduled with Jearld Fenton, RDN, CDE for diabetes education.  - I have approached him with the following individualized plan to manage his diabetes and patient agrees:   -He is advised to continue his Lantus at 50 units SQ nightly and adjust his Humalog to 12-18 units TID with meals if glucose above 90 and he is eating (specific instructions on how to titrate insulin dose based on glucose readings given to patient in writing).    -he is encouraged to start monitoring glucose 4 times daily, before meals and before bed, to log their readings on the clinic sheets provided, and bring them to review at follow up appointment in 2 weeks.  Sample meter provided to him today.  - he is  warned not to take insulin without proper monitoring per orders.  - Adjustment parameters are given to  him for hypo and hyperglycemia in writing. - he is encouraged to call clinic for blood glucose levels less than 70 or above 300 mg /dl. - he is advised to continue Metformin 1000 mg po twice daily with meals, therapeutically suitable for patient.  Will lower his Glipizide to 5 mg XL daily with breakfast given his report of hypoglycemia symptoms.  - his Actos 15 mg will be discontinued, risk outweighs benefit for this patient (may be contributing to his ankle swelling).  - he can continue Ozempic 1 mg SQ weekly for incretin therapy to help with weight and glucose control.  - Specific targets for  A1c; LDL, HDL, and Triglycerides were discussed with the patient.  2) Blood Pressure /Hypertension:  his blood pressure is not controlled to target.  He has not yet taken his BP medications.  he is advised to continue his current medications including Norvasc 5 mg p.o. daily with breakfast, HCTZ 25 mg po daily, and Losartan 100 mg po daily.  3) Lipids/Hyperlipidemia:    Review of his recent lipid panel from 07/27/20 showed controlled LDL at 55 .  he is advised to continue Lipitor 20 mg daily at bedtime.  Side effects and precautions discussed with him.  4)  Weight/Diet:  his Body mass index is 57.13 kg/m.  -  clearly complicating his diabetes care.   he is a candidate for weight loss. I discussed with him the fact that loss of 5 - 10% of his  current body weight will have the most impact on his diabetes management.  Exercise, and detailed carbohydrates information provided  -  detailed on discharge instructions.  He wants to discuss medications to help him lose weight including "GoLo" supplement.  Will discuss further at his return appt in 2 weeks.  5) Hypothyroidism- unspecified He is currently on Levothyroxine 137 mcg po daily before breakfast.  There are no recent thyroid function tests to review.   We discussed the proper way to take his thyroid medication.  6) Chronic Care/Health Maintenance: -he is on ACEI/ARB and Statin medications and is encouraged to initiate and continue to follow up with Ophthalmology, Dentist, Podiatrist at least yearly or according to recommendations, and advised to stay away from smoking. I have recommended yearly flu vaccine and pneumonia vaccine at least every 5 years; moderate intensity exercise for up to 150 minutes weekly; and sleep for at least 7 hours a day.  - he is advised to maintain close follow up with Celene Squibb, MD for primary care needs, as well as his other providers for optimal and coordinated care.   - Time spent in this patient care: 60 min, of which > 50% was spent in counseling him about his diabetes and the rest reviewing his blood glucose logs, discussing his hypoglycemia and hyperglycemia episodes, reviewing his current and previous labs/studies (including abstraction from other facilities) and medications doses and developing a long term treatment plan based on the latest standards of care/guidelines; and documenting his care.    Please refer to Patient Instructions for Blood Glucose Monitoring and Insulin/Medications Dosing Guide" in media tab for additional information. Please also refer to "Patient Self Inventory" in the Media tab for reviewed elements of pertinent patient history.  Adelfa Koh participated in the discussions, expressed understanding, and voiced agreement with the above plans.  All questions were answered to his satisfaction. he is encouraged to contact clinic should he have any questions or concerns prior to his return visit.  Follow up plan: - Return in about 2 weeks (around 09/06/2020) for Diabetes F/U, Bring meter and logs, No previsit labs.    Rayetta Pigg, [facility] [address] [place], [place] [address] Phone: [phone] Fax: [phone]  08/23/2020,  11:43 AM

## 2020-08-23 NOTE — Patient Instructions (Signed)
Diabetes Mellitus and Nutrition, Adult When you have diabetes, or diabetes mellitus, it is very important to have healthy eating habits because your blood sugar (glucose) levels are greatly affected by what you eat and drink. Eating healthy foods in the right amounts, at about the same times every day, can help you:  Control your blood glucose.  Lower your risk of heart disease.  Improve your blood pressure.  Reach or maintain a healthy weight. What can affect my meal plan? Every person with diabetes is different, and each person has different needs for a meal plan. Your health care provider may recommend that you work with a dietitian to make a meal plan that is best for you. Your meal plan may vary depending on factors such as:  The calories you need.  The medicines you take.  Your weight.  Your blood glucose, blood pressure, and cholesterol levels.  Your activity level.  Other health conditions you have, such as heart or kidney disease. How do carbohydrates affect me? Carbohydrates, also called carbs, affect your blood glucose level more than any other type of food. Eating carbs naturally raises the amount of glucose in your blood. Carb counting is a method for keeping track of how many carbs you eat. Counting carbs is important to keep your blood glucose at a healthy level, especially if you use insulin or take certain oral diabetes medicines. It is important to know how many carbs you can safely have in each meal. This is different for every person. Your dietitian can help you calculate how many carbs you should have at each meal and for each snack. How does alcohol affect me? Alcohol can cause a sudden decrease in blood glucose (hypoglycemia), especially if you use insulin or take certain oral diabetes medicines. Hypoglycemia can be a life-threatening condition. Symptoms of hypoglycemia, such as sleepiness, dizziness, and confusion, are similar to symptoms of having too much  alcohol.  Do not drink alcohol if: ? Your health care provider tells you not to drink. ? You are pregnant, may be pregnant, or are planning to become pregnant.  If you drink alcohol: ? Do not drink on an empty stomach. ? Limit how much you use to:  0-1 drink a day for women.  0-2 drinks a day for men. ? Be aware of how much alcohol is in your drink. In the [place] one drink equals one 12 oz bottle of beer (355 mL), one 5 oz glass of wine (148 mL), or one 1 oz glass of hard liquor (44 mL). ? Keep yourself hydrated with water, diet soda, or unsweetened iced tea.  Keep in mind that regular soda, juice, and other mixers may contain a lot of sugar and must be counted as carbs. What are tips for following this plan? Reading food labels  Start by checking the serving size on the "Nutrition Facts" label of packaged foods and drinks. The amount of calories, carbs, fats, and other nutrients listed on the label is based on one serving of the item. Many items contain more than one serving per package.  Check the total grams (g) of carbs in one serving. You can calculate the number of servings of carbs in one serving by dividing the total carbs by 15. For example, if a food has 30 g of total carbs per serving, it would be equal to 2 servings of carbs.  Check the number of grams (g) of saturated fats and trans fats in one serving. Choose foods that have   a low amount or none of these fats.  Check the number of milligrams (mg) of salt (sodium) in one serving. Most people should limit total sodium intake to less than 2,300 mg per day.  Always check the nutrition information of foods labeled as "low-fat" or "nonfat." These foods may be higher in added sugar or refined carbs and should be avoided.  Talk to your dietitian to identify your daily goals for nutrients listed on the label. Shopping  Avoid buying canned, pre-made, or processed foods. These foods tend to be high in fat, sodium, and added  sugar.  Shop around the outside edge of the grocery store. This is where you will most often find fresh fruits and vegetables, bulk grains, fresh meats, and fresh dairy. Cooking  Use low-heat cooking methods, such as baking, instead of high-heat cooking methods like deep frying.  Cook using healthy oils, such as olive, canola, or sunflower oil.  Avoid cooking with butter, cream, or high-fat meats. Meal planning  Eat meals and snacks regularly, preferably at the same times every day. Avoid going long periods of time without eating.  Eat foods that are high in fiber, such as fresh fruits, vegetables, beans, and whole grains. Talk with your dietitian about how many servings of carbs you can eat at each meal.  Eat 4-6 oz (112-168 g) of lean protein each day, such as lean meat, chicken, fish, eggs, or tofu. One ounce (oz) of lean protein is equal to: ? 1 oz (28 g) of meat, chicken, or fish. ? 1 egg. ?  cup (62 g) of tofu.  Eat some foods each day that contain healthy fats, such as avocado, nuts, seeds, and fish.   What foods should I eat? Fruits Berries. Apples. Oranges. Peaches. Apricots. Plums. Grapes. Mango. Papaya. Pomegranate. Kiwi. Cherries. Vegetables Lettuce. Spinach. Leafy greens, including kale, chard, collard greens, and mustard greens. Beets. Cauliflower. Cabbage. Broccoli. Carrots. Green beans. Tomatoes. Peppers. Onions. Cucumbers. Brussels sprouts. Grains Whole grains, such as whole-wheat or whole-grain bread, crackers, tortillas, cereal, and pasta. Unsweetened oatmeal. Quinoa. Brown or wild rice. Meats and other proteins Seafood. Poultry without skin. Lean cuts of poultry and beef. Tofu. Nuts. Seeds. Dairy Low-fat or fat-free dairy products such as milk, yogurt, and cheese. The items listed above may not be a complete list of foods and beverages you can eat. Contact a dietitian for more information. What foods should I avoid? Fruits Fruits canned with  syrup. Vegetables Canned vegetables. Frozen vegetables with butter or cream sauce. Grains Refined white flour and flour products such as bread, pasta, snack foods, and cereals. Avoid all processed foods. Meats and other proteins Fatty cuts of meat. Poultry with skin. Breaded or fried meats. Processed meat. Avoid saturated fats. Dairy Full-fat yogurt, cheese, or milk. Beverages Sweetened drinks, such as soda or iced tea. The items listed above may not be a complete list of foods and beverages you should avoid. Contact a dietitian for more information. Questions to ask a health care provider  Do I need to meet with a diabetes educator?  Do I need to meet with a dietitian?  What number can I call if I have questions?  When are the best times to check my blood glucose? Where to find more information:  American Diabetes Association: diabetes.org  Academy of Nutrition and Dietetics: www.eatright.org  National Institute of Diabetes and Digestive and Kidney Diseases: www.niddk.nih.gov  Association of Diabetes Care and Education Specialists: www.diabeteseducator.org Summary  It is important to have healthy eating   habits because your blood sugar (glucose) levels are greatly affected by what you eat and drink.  A healthy meal plan will help you control your blood glucose and maintain a healthy lifestyle.  Your health care provider may recommend that you work with a dietitian to make a meal plan that is best for you.  Keep in mind that carbohydrates (carbs) and alcohol have immediate effects on your blood glucose levels. It is important to count carbs and to use alcohol carefully. This information is not intended to replace advice given to you by your health care provider. Make sure you discuss any questions you have with your health care provider. Document Revised: 02/11/2019 Document Reviewed: 02/11/2019 Elsevier Patient Education  2021 [employer]  

## 2020-08-26 ENCOUNTER — Telehealth: Payer: Self-pay | Admitting: Nurse Practitioner

## 2020-08-26 NOTE — Telephone Encounter (Signed)
Clarified order to reduce glipizide to 5mg  daily per Rayetta Pigg, FNP. Understanding voiced.

## 2020-08-26 NOTE — Telephone Encounter (Signed)
Cyril Mourning is calling from [facility] and is needing to clarify that the new medication for glipizide 5mg  is correct as in previous they have the pt on 10mg . Requesting a call back to confirm at [phone]. Reference # [id number]

## 2020-09-06 ENCOUNTER — Other Ambulatory Visit: Payer: Self-pay

## 2020-09-06 DIAGNOSIS — E119 Type 2 diabetes mellitus without complications: Secondary | ICD-10-CM

## 2020-09-06 MED ORDER — GLUCOSE BLOOD VI STRP: 1.0000 | ORAL_STRIP | Freq: Four times a day (QID) | 2 refills | Status: AC

## 2020-09-06 MED ORDER — ONETOUCH DELICA LANCETS 30G MISC: 1.0000 | Freq: Four times a day (QID) | 3 refills | Status: AC

## 2020-09-07 DIAGNOSIS — B351 Tinea unguium: Secondary | ICD-10-CM | POA: Diagnosis not present

## 2020-09-07 DIAGNOSIS — E1142 Type 2 diabetes mellitus with diabetic polyneuropathy: Secondary | ICD-10-CM | POA: Diagnosis not present

## 2020-09-07 DIAGNOSIS — G4733 Obstructive sleep apnea (adult) (pediatric): Secondary | ICD-10-CM | POA: Diagnosis not present

## 2020-09-08 ENCOUNTER — Ambulatory Visit: Payer: PPO | Admitting: Nurse Practitioner

## 2020-09-08 ENCOUNTER — Other Ambulatory Visit: Payer: Self-pay

## 2020-09-08 ENCOUNTER — Encounter: Payer: Self-pay | Admitting: Nurse Practitioner

## 2020-09-08 VITALS — BP 188/79 | HR 69 | Ht 73.0 in | Wt >= 6400 oz

## 2020-09-08 DIAGNOSIS — E782 Mixed hyperlipidemia: Secondary | ICD-10-CM

## 2020-09-08 DIAGNOSIS — E119 Type 2 diabetes mellitus without complications: Secondary | ICD-10-CM

## 2020-09-08 DIAGNOSIS — I10 Essential (primary) hypertension: Secondary | ICD-10-CM | POA: Diagnosis not present

## 2020-09-08 DIAGNOSIS — E039 Hypothyroidism, unspecified: Secondary | ICD-10-CM

## 2020-09-08 NOTE — Progress Notes (Signed)
Endocrinology Follow Up Note       09/08/2020, 11:15 AM   Subjective:    Patient ID: Ronald Heath, male    DOB: [birth year].  Ronald Heath is being seen in follow up after being seen in consultation for management of currently uncontrolled symptomatic diabetes requested by  Celene Squibb, MD.  He is transferring care from his previous Endocrinologist Dr. Elyse Hsu who is retiring.   Past Medical History:  Diagnosis Date   Depression    Diabetes mellitus without complication ([place])    Finger infection 12/31/2018   Hyperlipidemia    Hypertension    Hypothyroidism    Shortness of breath    with exertion   Sleep apnea    pt is supposed to wear CPAP, but doesn't wear it    Past Surgical History:  Procedure Laterality Date   AMPUTATION Left 01/01/2019   Procedure: AMPUTATION LEFT INDEX FINGER;  Surgeon: [name], MD;  Location: [facility];  Service: Orthopedics;  Laterality: Left;   CATARACT EXTRACTION W/PHACO Right 11/21/2012   Procedure: CATARACT EXTRACTION PHACO AND INTRAOCULAR LENS PLACEMENT (IOC);  Surgeon: Tonny Branch, MD;  Location: [facility];  Service: Ophthalmology;  Laterality: Right;  CDE:19.67   CATARACT EXTRACTION W/PHACO Left 12/19/2012   Procedure: CATARACT EXTRACTION PHACO AND INTRAOCULAR LENS PLACEMENT (IOC);  Surgeon: Tonny Branch, MD;  Location: [facility];  Service: Ophthalmology;  Laterality: Left;  CDE:8.00   KNEE ARTHROSCOPY Left    ROTATOR CUFF REPAIR Right     Social History   Socioeconomic History   Marital status: Widowed    Spouse name: Not on file   Number of children: Not on file   Years of education: Not on file   Highest education level: Not on file  Occupational History   Not on file  Tobacco Use   Smoking status: Former    Packs/day: 2.00    Years: 15.00    Pack years: 30.00    Types: Cigarettes   Smokeless tobacco: Never  Vaping Use   Vaping Use: Never used   Substance and Sexual Activity   Alcohol use: Yes    Alcohol/week: 2.0 standard drinks    Types: 2 Cans of beer per week   Drug use: No   Sexual activity: Not on file  Other Topics Concern   Not on file  Social History Narrative   Not on file   Social Determinants of Health   Financial Resource Strain: Not on file  Food Insecurity: Not on file  Transportation Needs: Not on file  Physical Activity: Not on file  Stress: Not on file  Social Connections: Not on file    Family History  Problem Relation Age of Onset   Diabetes Mellitus II Father    Stroke Father     Outpatient Encounter Medications as of 09/08/2020  Medication Sig   amLODipine (NORVASC) 10 MG tablet Take 5 mg by mouth daily.    aspirin EC 81 MG tablet Take 81 mg by mouth daily.   atorvastatin (LIPITOR) 20 MG tablet Take 20 mg by mouth daily.   glipiZIDE (GLUCOTROL XL) 5 MG 24 hr tablet Take 1 tablet (5 mg total) by  mouth daily with breakfast.   glucose blood test strip 1 each by Other route 4 (four) times daily. Use as instructed   hydrochlorothiazide (HYDRODIURIL) 25 MG tablet Take 1 tablet by mouth daily.   insulin glargine (LANTUS) 100 UNIT/ML injection Inject 50 Units into the skin at bedtime.   insulin lispro (HUMALOG) 100 UNIT/ML injection Inject 12-18 Units into the skin 3 (three) times daily after meals.   levothyroxine (SYNTHROID, LEVOTHROID) 137 MCG tablet Take 137 mcg by mouth daily before breakfast.   losartan (COZAAR) 100 MG tablet Take 100 mg by mouth daily. Taking half tablet daily.   metFORMIN (GLUCOPHAGE) 1000 MG tablet Take 1,000 mg by mouth 2 (two) times daily with a meal.   OneTouch Delica Lancets 20U MISC 1 each by Does not apply route 4 (four) times daily.   PARoxetine (PAXIL) 40 MG tablet Take 20 mg by mouth every morning.   pramipexole (MIRAPEX) 1 MG tablet Take 1 mg by mouth at bedtime.   Semaglutide,0.25 or 0.5MG /DOS, (OZEMPIC, 0.25 OR 0.5 MG/DOSE,) 2 MG/1.5ML SOPN Inject 0.5 mg into the  skin every Wednesday.   vitamin B-12 (CYANOCOBALAMIN) 1000 MCG tablet Take 1,000 mcg by mouth daily.   No facility-administered encounter medications on file as of 09/08/2020.    ALLERGIES: No Known Allergies  VACCINATION STATUS: Immunization History  Administered Date(s) Administered   Influenza, High Dose Seasonal PF 01/16/2018   Tdap 12/30/2018    Diabetes He presents for his follow-up diabetic visit. He has type 2 diabetes mellitus. Onset time: Diagnosed at approx age of 24. His disease course has been improving. Hypoglycemia symptoms include sweats. Pertinent negatives for hypoglycemia include no nervousness/anxiousness or tremors. Associated symptoms include foot paresthesias. Pertinent negatives for diabetes include no fatigue. Hypoglycemia complications include nocturnal hypoglycemia. Symptoms are stable. Diabetic complications include nephropathy, peripheral neuropathy and retinopathy. Risk factors for coronary artery disease include diabetes mellitus, dyslipidemia, family history, hypertension, male sex, obesity and sedentary lifestyle. Current diabetic treatment includes intensive insulin program and oral agent (dual therapy). He is compliant with treatment most of the time. His weight is fluctuating minimally. He is following a generally unhealthy diet. When asked about meal planning, he reported none. He has not had a previous visit with a dietitian. He never participates in exercise. His home blood glucose trend is decreasing steadily. His breakfast blood glucose range is generally 90-110 mg/dl. His lunch blood glucose range is generally 110-130 mg/dl. His dinner blood glucose range is generally 110-130 mg/dl. His bedtime blood glucose range is generally 180-200 mg/dl. (He presents today with his meter and logs showing stable glycemic profile overall with some mild, random events of hypoglycemia.  He reports he has been taking the higher dose of Glipizide (his original prescription) at  10 mg XL because he had not yet received the lower 5 mg XL dose.  Analysis of his meter shows 7-day average of 138, 14-day average of 130, 30-day average of 128.  I suspect he would have had better control of his diabetes is he is able to avoid hypoglycemia.) An ACE inhibitor/angiotensin II receptor blocker is being taken. He sees a podiatrist.Eye exam is current.  Hyperlipidemia This is a chronic problem. The current episode started more than 1 year ago. The problem is controlled. Recent lipid tests were reviewed and are normal. Exacerbating diseases include chronic renal disease, diabetes, hypothyroidism and obesity. Factors aggravating his hyperlipidemia include fatty foods and thiazides. Current antihyperlipidemic treatment includes statins. The current treatment provides significant improvement of lipids.  Compliance problems include adherence to diet and adherence to exercise.  Risk factors for coronary artery disease include diabetes mellitus, dyslipidemia, hypertension, male sex, obesity and a sedentary lifestyle.  Hypertension This is a chronic problem. The current episode started more than 1 year ago. The problem has been waxing and waning since onset. The problem is uncontrolled. Associated symptoms include peripheral edema and sweats. Agents associated with hypertension include thyroid hormones. Risk factors for coronary artery disease include diabetes mellitus, dyslipidemia, family history, obesity, male gender and sedentary lifestyle. Past treatments include calcium channel blockers, diuretics and angiotensin blockers. The current treatment provides mild improvement. Compliance problems include diet and exercise.  Hypertensive end-organ damage includes kidney disease and retinopathy. Identifiable causes of hypertension include chronic renal disease and a thyroid problem.  Thyroid Problem Presents for follow-up visit. Symptoms include weight gain. Patient reports no anxiety, fatigue or tremors. The  symptoms have been stable. His past medical history is significant for diabetes and hyperlipidemia.    Review of systems  Constitutional: + stable body weight, current Body mass index is 57 kg/m., + fatigue, no subjective hyperthermia, no subjective hypothermia Eyes: no blurry vision, no xerophthalmia ENT: no sore throat, no nodules palpated in throat, no dysphagia/odynophagia, no hoarseness Cardiovascular: no chest pain, no shortness of breath, no palpitations, + ankle swelling Respiratory: no cough, no shortness of breath Gastrointestinal: no nausea/vomiting/diarrhea Musculoskeletal: right knee pain (needs TKR) Skin: no rashes, no hyperemia Neurological: no tremors, no numbness, no tingling, no dizziness Psychiatric: no depression, no anxiety  Objective:     BP (!) 188/79   Pulse 69   Ht 6\' 1"  (1.854 m)   Wt (!) 432 lb (196 kg)   BMI 57.00 kg/m   Wt Readings from Last 3 Encounters:  09/08/20 (!) 432 lb (196 kg)  08/23/20 (!) 433 lb (196.4 kg)  11/03/19 (!) 427 lb (193.7 kg)     BP Readings from Last 3 Encounters:  09/08/20 (!) 188/79  08/23/20 (!) 177/66  11/03/19 (!) 187/70     Physical Exam- Limited  Constitutional:  Body mass index is 57 kg/m. , not in acute distress, normal state of mind Eyes:  EOMI, no exophthalmos Neck: Supple Cardiovascular: RRR, no murmurs, rubs, or gallops, nonpitting edema to BLE (ankles) Respiratory: Adequate breathing efforts, no crackles, rales, rhonchi, or wheezing Musculoskeletal: missing left 1st digit on his hand from previous accident, strength intact in all four extremities, no gross restriction of joint movements Skin:  no rashes, no hyperemia Neurological: no tremor with outstretched hands    POCT ABI Results 09/08/20   Right ABI:  1.05      Left ABI:  1.05  Right leg systolic / diastolic: 277/82 mmHg Left leg systolic / diastolic: 423/53 mmHg  Arm systolic / diastolic: 614/43 mmHG  Detailed report will be scanned  into patient chart.  CMP ( most recent) CMP     Component Value Date/Time   NA 136 11/03/2019 1035   K 4.8 11/03/2019 1035   CL 101 11/03/2019 1035   CO2 28 11/03/2019 1035   GLUCOSE 120 (H) 11/03/2019 1035   BUN 22 (A) 07/27/2020 0000   CREATININE 1.1 07/27/2020 0000   CREATININE 1.19 (H) 11/03/2019 1035   CALCIUM 8.9 11/03/2019 1035   PROT 6.5 01/29/2019 1130   ALBUMIN 3.1 (L) 01/29/2019 1130   AST 17 01/29/2019 1130   ALT 16 01/29/2019 1130   ALKPHOS 62 01/29/2019 1130   BILITOT 0.6 01/29/2019 1130   GFRNONAA 73 07/27/2020  0000   GFRNONAA 60 11/03/2019 1035   GFRAA 70 11/03/2019 1035     Diabetic Labs (most recent): Lab Results  Component Value Date   HGBA1C 6.8 07/27/2020   HGBA1C 6.9 (H) 12/31/2018   HGBA1C 6.8 (H) 12/30/2018     Lipid Panel ( most recent) Lipid Panel     Component Value Date/Time   TRIG 52 07/27/2020 0000   LDLCALC 55 07/27/2020 0000      No results found for: TSH, FREET4         Assessment & Plan:   1) Diabetes mellitus without complication ([place])  He presents today with his meter and logs showing stable glycemic profile overall with some mild, random events of hypoglycemia.  He reports he has been taking the higher dose of Glipizide (his original prescription) at 10 mg XL because he had not yet received the lower 5 mg XL dose.  Analysis of his meter shows 7-day average of 138, 14-day average of 130, 30-day average of 128.  I suspect he would have had better control of his diabetes is he is able to avoid hypoglycemia.  - Ronald Heath has currently uncontrolled symptomatic type 2 DM since 75 years of age, with most recent A1c of 6.8 %.   -Recent labs reviewed.  - I had a long discussion with him about the progressive nature of diabetes and the pathology behind its complications. 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 These are all discussed in detail with him.  - Nutritional counseling repeated at each appointment due to patients tendency to fall back in to old habits.  - The patient admits there is a room for improvement in their diet and drink choices. -  Suggestion is made for the patient to avoid simple carbohydrates from their diet including Cakes, Sweet Desserts / Pastries, Ice Cream, Soda (diet and regular), Sweet Tea, Candies, Chips, Cookies, Sweet Pastries, Store Bought Juices, Alcohol in Excess of 1-2 drinks a day, Artificial Sweeteners, Coffee Creamer, and "Sugar-free" Products. This will help patient to have stable blood glucose profile and potentially avoid unintended weight gain.   - I encouraged the patient to switch to unprocessed or minimally processed complex starch and increased protein intake (animal or plant source), fruits, and vegetables.   - Patient is advised to stick to a routine mealtimes to eat 3 meals a day and avoid unnecessary snacks (to snack only to correct hypoglycemia).  - he will be scheduled with Jearld Fenton, RDN, CDE for diabetes education.  - I have approached him with the following individualized plan to manage his diabetes and patient agrees:   -Based on his improving glycemic profile, he is advised to continue current medication regimen with the exception of his Glipizide.  He is advised to lower his dose of Glipizide (as previously discussed) to 5 mg XL daily with breakfast.  He can continue Lantus 50 units SQ  nightly, continue Humalog 12-18 units TID with meals, continue Metformin 1000 mg po twice daily with meals.  He can also continue his Ozempic 1 mg SQ weekly.  -he is encouraged to start monitoring glucose 4 times daily, before meals and before bed, and to call the clinic if he has readings less than 70 or greater than 300 for 3 tests in a row.  - he is warned not to take insulin without  proper monitoring per orders.  -  Adjustment parameters are given to him for hypo and hyperglycemia in writing.  - Specific targets for  A1c; LDL, HDL, and Triglycerides were discussed with the patient.  2) Blood Pressure /Hypertension:  his blood pressure is not controlled to target.  He has not yet taken his BP medications.  he is advised to continue his current medications including Norvasc 5 mg p.o. daily with breakfast, HCTZ 25 mg po daily, and Losartan 100 mg po daily.  3) Lipids/Hyperlipidemia:    Review of his recent lipid panel from 07/27/20 showed controlled LDL at 55 .  he is advised to continue Lipitor 20 mg daily at bedtime.  Side effects and precautions discussed with him.  4)  Weight/Diet:  his Body mass index is 57 kg/m.  -  clearly complicating his diabetes care.   he is a candidate for weight loss. I discussed with him the fact that loss of 5 - 10% of his  current body weight will have the most impact on his diabetes management.  Exercise, and detailed carbohydrates information provided  -  detailed on discharge instructions.  He wants to discuss medications to help him lose weight including "GoLo" supplement.  Will discuss further at his return appt in 2 weeks.  5) Hypothyroidism- unspecified He is currently on Levothyroxine 137 mcg po daily before breakfast.  There are no recent thyroid function tests to review.  We discussed the proper way to take his thyroid medication. Will recheck thyroid function tests prior to next visit and adjust dose if necessary.  6) Chronic Care/Health Maintenance: -he is on ACEI/ARB and Statin medications and is encouraged to initiate and continue to follow up with Ophthalmology, Dentist, Podiatrist at least yearly or according to recommendations, and advised to stay away from smoking. I have recommended yearly flu vaccine and pneumonia vaccine at least every 5 years; moderate intensity exercise for up to 150 minutes weekly; and sleep for at least 7 hours a day.  - he is advised to  maintain close follow up with Celene Squibb, MD for primary care needs, as well as his other providers for optimal and coordinated care.     I spent 43 minutes in the care of the patient today including review of labs from [facility], Lipids, Thyroid Function, Hematology (current and previous including abstractions from other facilities); face-to-face time discussing  his blood glucose readings/logs, discussing hypoglycemia and hyperglycemia episodes and symptoms, medications doses, his options of short and long term treatment based on the latest standards of care / guidelines;  discussion about incorporating lifestyle medicine;  and documenting the encounter.    Please refer to Patient Instructions for Blood Glucose Monitoring and Insulin/Medications Dosing Guide"  in media tab for additional information. Please  also refer to " Patient Self Inventory" in the Media  tab for reviewed elements of pertinent patient history.  Adelfa Koh participated in the discussions, expressed understanding, and voiced agreement with the above plans.  All questions were answered to his satisfaction. he is encouraged to contact clinic should he have any questions or concerns prior to his return visit.     Follow up plan: - Return in about 4 months (around 01/08/2021) for Diabetes F/U- A1c and UM in office, Thyroid follow up, Previsit labs, Bring meter and logs.    Rayetta Pigg, [facility] [address] [place], [place] [address] Phone: [phone] Fax: [phone]  09/08/2020, 11:15 AM

## 2020-09-08 NOTE — Patient Instructions (Signed)
Advice for Weight Management  -For most of us the best way to lose weight is by diet management. Generally speaking, diet management means consuming less calories intentionally which over time brings about progressive weight loss.  This can be achieved more effectively by restricting carbohydrate consumption to the minimum possible.  So, it is critically important to know your numbers: how much calorie you are consuming and how much calorie you need. More importantly, our carbohydrates sources should be unprocessed or minimally processed complex starch food items.   Sometimes, it is important to balance nutrition by increasing protein intake (animal or plant source), fruits, and vegetables.  -Sticking to a routine mealtime to eat 3 meals a day and avoiding unnecessary snacks is shown to have a big role in weight control. Under normal circumstances, the only time we lose real weight is when we are hungry, so allow hunger to take place- hunger means no food between meal times, only water.  It is not advisable to starve.   -It is better to avoid simple carbohydrates including: Cakes, Sweet Desserts, Ice Cream, Soda (diet and regular), Sweet Tea, Candies, Chips, Cookies, Store Bought Juices, Alcohol in Excess of  1-2 drinks a day, Artificial Sweeteners, Doughnuts, Coffee Creamers, "Sugar-free" Products, etc, etc.  This is not a complete list.....    -Consulting with certified diabetes educators is proven to provide you with the most accurate and current information on diet.  Also, you may be  interested in discussing diet options/exchanges , we can schedule a visit with Ronald Heath, RDN, CDE for individualized nutrition education.  -Exercise: If you are able: 30 -60 minutes a day ,4 days a week, or 150 minutes a week.  The longer the better.  Combine stretch, strength, and aerobic activities.  If you were told in the past that you have high risk for cardiovascular diseases, you may seek evaluation by  your heart doctor prior to initiating moderate to intense exercise programs.    

## 2020-10-06 DIAGNOSIS — H26492 Other secondary cataract, left eye: Secondary | ICD-10-CM | POA: Diagnosis not present

## 2020-10-06 DIAGNOSIS — E113313 Type 2 diabetes mellitus with moderate nonproliferative diabetic retinopathy with macular edema, bilateral: Secondary | ICD-10-CM | POA: Diagnosis not present

## 2020-10-06 DIAGNOSIS — H3582 Retinal ischemia: Secondary | ICD-10-CM | POA: Diagnosis not present

## 2020-10-06 DIAGNOSIS — H43813 Vitreous degeneration, bilateral: Secondary | ICD-10-CM | POA: Diagnosis not present

## 2020-10-07 DIAGNOSIS — G4733 Obstructive sleep apnea (adult) (pediatric): Secondary | ICD-10-CM | POA: Diagnosis not present

## 2020-11-07 DIAGNOSIS — G4733 Obstructive sleep apnea (adult) (pediatric): Secondary | ICD-10-CM | POA: Diagnosis not present

## 2020-11-16 DIAGNOSIS — E1142 Type 2 diabetes mellitus with diabetic polyneuropathy: Secondary | ICD-10-CM | POA: Diagnosis not present

## 2020-11-16 DIAGNOSIS — B351 Tinea unguium: Secondary | ICD-10-CM | POA: Diagnosis not present

## 2020-11-17 DIAGNOSIS — I10 Essential (primary) hypertension: Secondary | ICD-10-CM | POA: Diagnosis not present

## 2020-11-17 DIAGNOSIS — E1165 Type 2 diabetes mellitus with hyperglycemia: Secondary | ICD-10-CM | POA: Diagnosis not present

## 2020-12-08 DIAGNOSIS — G4733 Obstructive sleep apnea (adult) (pediatric): Secondary | ICD-10-CM | POA: Diagnosis not present

## 2020-12-17 DIAGNOSIS — E1165 Type 2 diabetes mellitus with hyperglycemia: Secondary | ICD-10-CM | POA: Diagnosis not present

## 2020-12-17 DIAGNOSIS — I10 Essential (primary) hypertension: Secondary | ICD-10-CM | POA: Diagnosis not present

## 2021-01-03 DIAGNOSIS — E039 Hypothyroidism, unspecified: Secondary | ICD-10-CM | POA: Diagnosis not present

## 2021-01-04 LAB — T4, FREE: Free T4: 1.27 ng/dL (ref 0.82–1.77)

## 2021-01-04 LAB — TSH: TSH: 1.44 u[IU]/mL (ref 0.450–4.500)

## 2021-01-07 DIAGNOSIS — G4733 Obstructive sleep apnea (adult) (pediatric): Secondary | ICD-10-CM | POA: Diagnosis not present

## 2021-01-10 ENCOUNTER — Encounter: Payer: Self-pay | Admitting: Nurse Practitioner

## 2021-01-10 ENCOUNTER — Ambulatory Visit (INDEPENDENT_AMBULATORY_CARE_PROVIDER_SITE_OTHER): Payer: PPO | Admitting: Nurse Practitioner

## 2021-01-10 VITALS — BP 152/68 | HR 62 | Ht 73.0 in | Wt >= 6400 oz

## 2021-01-10 DIAGNOSIS — E119 Type 2 diabetes mellitus without complications: Secondary | ICD-10-CM | POA: Diagnosis not present

## 2021-01-10 DIAGNOSIS — E039 Hypothyroidism, unspecified: Secondary | ICD-10-CM | POA: Diagnosis not present

## 2021-01-10 DIAGNOSIS — I10 Essential (primary) hypertension: Secondary | ICD-10-CM | POA: Diagnosis not present

## 2021-01-10 DIAGNOSIS — E782 Mixed hyperlipidemia: Secondary | ICD-10-CM

## 2021-01-10 LAB — POCT GLYCOSYLATED HEMOGLOBIN (HGB A1C): HbA1c, POC (controlled diabetic range): 6.7 % (ref 0.0–7.0)

## 2021-01-10 NOTE — Progress Notes (Signed)
Endocrinology Follow Up Note       01/10/2021, 11:16 AM   Subjective:    Patient ID: Ronald Heath, male    DOB: 18-Jan-1946.  Ronald Heath is being seen in follow up after being seen in consultation for management of currently uncontrolled symptomatic diabetes requested by  Celene Squibb, MD.  He is transferring care from his previous Endocrinologist Dr. Elyse Hsu who is retiring.   Past Medical History:  Diagnosis Date   Depression    Diabetes mellitus without complication ([place])    Finger infection 12/31/2018   Hyperlipidemia    Hypertension    Hypothyroidism    Shortness of breath    with exertion   Sleep apnea    pt is supposed to wear CPAP, but doesn't wear it    Past Surgical History:  Procedure Laterality Date   AMPUTATION Left 01/01/2019   Procedure: AMPUTATION LEFT INDEX FINGER;  Surgeon: [name], MD;  Location: [facility];  Service: Orthopedics;  Laterality: Left;   CATARACT EXTRACTION W/PHACO Right 11/21/2012   Procedure: CATARACT EXTRACTION PHACO AND INTRAOCULAR LENS PLACEMENT (IOC);  Surgeon: Tonny Branch, MD;  Location: [facility];  Service: Ophthalmology;  Laterality: Right;  CDE:19.67   CATARACT EXTRACTION W/PHACO Left 12/19/2012   Procedure: CATARACT EXTRACTION PHACO AND INTRAOCULAR LENS PLACEMENT (IOC);  Surgeon: Tonny Branch, MD;  Location: [facility];  Service: Ophthalmology;  Laterality: Left;  CDE:8.00   KNEE ARTHROSCOPY Left    ROTATOR CUFF REPAIR Right     Social History   Socioeconomic History   Marital status: Widowed    Spouse name: Not on file   Number of children: Not on file   Years of education: Not on file   Highest education level: Not on file  Occupational History   Not on file  Tobacco Use   Smoking status: Former    Packs/day: 2.00    Years: 15.00    Pack years: 30.00    Types: Cigarettes   Smokeless tobacco: Never  Vaping Use   Vaping Use: Never used   Substance and Sexual Activity   Alcohol use: Yes    Alcohol/week: 2.0 standard drinks    Types: 2 Cans of beer per week   Drug use: No   Sexual activity: Not on file  Other Topics Concern   Not on file  Social History Narrative   Not on file   Social Determinants of Health   Financial Resource Strain: Not on file  Food Insecurity: Not on file  Transportation Needs: Not on file  Physical Activity: Not on file  Stress: Not on file  Social Connections: Not on file    Family History  Problem Relation Age of Onset   Diabetes Mellitus II Father    Stroke Father     Outpatient Encounter Medications as of 01/10/2021  Medication Sig   amLODipine (NORVASC) 10 MG tablet Take 5 mg by mouth daily.    aspirin EC 81 MG tablet Take 81 mg by mouth daily.   atorvastatin (LIPITOR) 20 MG tablet Take 20 mg by mouth daily.   glipiZIDE (GLUCOTROL XL) 5 MG 24 hr tablet Take 1 tablet (5 mg total) by  mouth daily with breakfast.   glucose blood test strip 1 each by Other route 4 (four) times daily. Use as instructed   hydrochlorothiazide (HYDRODIURIL) 25 MG tablet Take 1 tablet by mouth daily.   insulin glargine (LANTUS) 100 UNIT/ML injection Inject 40 Units into the skin at bedtime.   insulin lispro (HUMALOG) 100 UNIT/ML injection Inject 8-14 Units into the skin 3 (three) times daily after meals.   levothyroxine (SYNTHROID, LEVOTHROID) 137 MCG tablet Take 137 mcg by mouth daily before breakfast.   losartan (COZAAR) 100 MG tablet Take 100 mg by mouth daily. Taking half tablet daily.   metFORMIN (GLUCOPHAGE) 1000 MG tablet Take 1,000 mg by mouth 2 (two) times daily with a meal.   OneTouch Delica Lancets 87F MISC 1 each by Does not apply route 4 (four) times daily.   PARoxetine (PAXIL) 40 MG tablet Take 20 mg by mouth every morning.   pramipexole (MIRAPEX) 1 MG tablet Take 1 mg by mouth at bedtime.   Semaglutide, 1 MG/DOSE, (OZEMPIC, 1 MG/DOSE,) 4 MG/3ML SOPN Inject 1 mg weekly for diabetes    vitamin B-12 (CYANOCOBALAMIN) 1000 MCG tablet Take 1,000 mcg by mouth daily.   [DISCONTINUED] Semaglutide,0.25 or 0.5MG /DOS, (OZEMPIC, 0.25 OR 0.5 MG/DOSE,) 2 MG/1.5ML SOPN Inject 0.5 mg into the skin every Wednesday. (Patient not taking: No sig reported)   No facility-administered encounter medications on file as of 01/10/2021.    ALLERGIES: No Known Allergies  VACCINATION STATUS: Immunization History  Administered Date(s) Administered   Influenza, High Dose Seasonal PF 01/16/2018   Tdap 12/30/2018    Diabetes He presents for his follow-up diabetic visit. He has type 2 diabetes mellitus. Onset time: Diagnosed at approx age of 3. His disease course has been improving. Hypoglycemia symptoms include sweats. Pertinent negatives for hypoglycemia include no nervousness/anxiousness or tremors. Associated symptoms include foot paresthesias and weight loss. Pertinent negatives for diabetes include no fatigue. Hypoglycemia complications include nocturnal hypoglycemia. Symptoms are stable. Diabetic complications include nephropathy, peripheral neuropathy and retinopathy. Risk factors for coronary artery disease include diabetes mellitus, dyslipidemia, family history, hypertension, male sex, obesity and sedentary lifestyle. Current diabetic treatment includes intensive insulin program and oral agent (dual therapy). He is compliant with treatment most of the time. His weight is decreasing rapidly. He is following a generally healthy diet. When asked about meal planning, he reported none. He has not had a previous visit with a dietitian. He never participates in exercise. His home blood glucose trend is decreasing steadily. His breakfast blood glucose range is generally <70 mg/dl. His lunch blood glucose range is generally 90-110 mg/dl. His dinner blood glucose range is generally 90-110 mg/dl. His bedtime blood glucose range is generally 90-110 mg/dl. His overall blood glucose range is 90-110 mg/dl. (He  presents today with his meter, no logs, showing frequent fasting hypoglycemia.  His POCT A1c today is 6.7%, essentially unchanged from previous visit of 6.8%.  Analysis of his meter shows 7-day average of 93, 14-day average of 94, 30-day average of 96, and 90-day average of 120.  He reports he sometimes forgets to take his Humalog as upper and will take it along with his Lantus at bedtime, contributing to his overnight lows.  He also went a period of 3 weeks without his Lantus due to difficulty getting his prescription.) An ACE inhibitor/angiotensin II receptor blocker is being taken. He sees a podiatrist.Eye exam is current.  Hyperlipidemia This is a chronic problem. The current episode started more than 1 year ago. The problem is  controlled. Recent lipid tests were reviewed and are normal. Exacerbating diseases include chronic renal disease, diabetes, hypothyroidism and obesity. Factors aggravating his hyperlipidemia include fatty foods and thiazides. Current antihyperlipidemic treatment includes statins. The current treatment provides significant improvement of lipids. Compliance problems include adherence to diet and adherence to exercise.  Risk factors for coronary artery disease include diabetes mellitus, dyslipidemia, hypertension, male sex, obesity and a sedentary lifestyle.  Hypertension This is a chronic problem. The current episode started more than 1 year ago. The problem has been gradually improving since onset. The problem is controlled. Associated symptoms include peripheral edema and sweats. Pertinent negatives include no palpitations. Agents associated with hypertension include thyroid hormones. Risk factors for coronary artery disease include diabetes mellitus, dyslipidemia, family history, obesity, male gender and sedentary lifestyle. Past treatments include calcium channel blockers, diuretics and angiotensin blockers. The current treatment provides mild improvement. Compliance problems  include diet and exercise.  Hypertensive end-organ damage includes kidney disease and retinopathy. Identifiable causes of hypertension include chronic renal disease and a thyroid problem.  Thyroid Problem Presents for follow-up visit. Symptoms include weight loss. Patient reports no anxiety, cold intolerance, constipation, depressed mood, fatigue, heat intolerance, leg swelling, palpitations, tremors or weight gain. The symptoms have been stable. His past medical history is significant for diabetes and hyperlipidemia.    Review of systems  Constitutional: + steadily decreasing body weight, current Body mass index is 54.96 kg/m.,  no subjective hyperthermia, no subjective hypothermia Eyes: no blurry vision, no xerophthalmia ENT: no sore throat, no nodules palpated in throat, no dysphagia/odynophagia, no hoarseness Cardiovascular: no chest pain, no shortness of breath, no palpitations, + ankle swelling Respiratory: no cough, no shortness of breath Gastrointestinal: no nausea/vomiting/diarrhea Musculoskeletal: right knee pain (needs TKR) Skin: no rashes, no hyperemia Neurological: no tremors, no numbness, no tingling, no dizziness Psychiatric: no depression, no anxiety  Objective:     BP (!) 152/68   Pulse 62   Ht 6\' 1"  (1.854 m)   Wt (!) 416 lb 9.6 oz (189 kg)   BMI 54.96 kg/m   Wt Readings from Last 3 Encounters:  01/10/21 (!) 416 lb 9.6 oz (189 kg)  09/08/20 (!) 432 lb (196 kg)  08/23/20 (!) 433 lb (196.4 kg)     BP Readings from Last 3 Encounters:  01/10/21 (!) 152/68  09/08/20 (!) 188/79  08/23/20 (!) 177/66     Physical Exam- Limited  Constitutional:  Body mass index is 54.96 kg/m. , not in acute distress, normal state of mind Eyes:  EOMI, no exophthalmos Neck: Supple Cardiovascular: RRR, no murmurs, rubs, or gallops, nonpitting edema to BLE (ankles) Respiratory: Adequate breathing efforts, no crackles, rales, rhonchi, or wheezing Musculoskeletal: missing left 1st  digit on his hand from previous accident, strength intact in all four extremities, no gross restriction of joint movements Skin:  no rashes, no hyperemia Neurological: no tremor with outstretched hands   CMP ( most recent) CMP     Component Value Date/Time   NA 136 11/03/2019 1035   K 4.8 11/03/2019 1035   CL 101 11/03/2019 1035   CO2 28 11/03/2019 1035   GLUCOSE 120 (H) 11/03/2019 1035   BUN 22 (A) 07/27/2020 0000   CREATININE 1.1 07/27/2020 0000   CREATININE 1.19 (H) 11/03/2019 1035   CALCIUM 8.9 11/03/2019 1035   PROT 6.5 01/29/2019 1130   ALBUMIN 3.1 (L) 01/29/2019 1130   AST 17 01/29/2019 1130   ALT 16 01/29/2019 1130   ALKPHOS 62 01/29/2019 1130  BILITOT 0.6 01/29/2019 1130   GFRNONAA 73 07/27/2020 0000   GFRNONAA 60 11/03/2019 1035   GFRAA 70 11/03/2019 1035     Diabetic Labs (most recent): Lab Results  Component Value Date   HGBA1C 6.7 01/10/2021   HGBA1C 6.8 07/27/2020   HGBA1C 6.9 (H) 12/31/2018     Lipid Panel ( most recent) Lipid Panel     Component Value Date/Time   TRIG 52 07/27/2020 0000   LDLCALC 55 07/27/2020 0000      Lab Results  Component Value Date   TSH 1.440 01/03/2021   FREET4 1.27 01/03/2021           Assessment & Plan:   1) Diabetes mellitus without complication ([place])  He presents today with his meter, no logs, showing frequent fasting hypoglycemia.  His POCT A1c today is 6.7%, essentially unchanged from previous visit of 6.8%.  Analysis of his meter shows 7-day average of 93, 14-day average of 94, 30-day average of 96, and 90-day average of 120.  He reports he sometimes forgets to take his Humalog as upper and will take it along with his Lantus at bedtime, contributing to his overnight lows.  He also went a period of 3 weeks without his Lantus due to difficulty getting his prescription.  - Ronald Heath has currently uncontrolled symptomatic type 2 DM since 75 years of age.  -Recent labs reviewed.  - I had a long  discussion with him about the progressive nature of diabetes and the pathology behind its complications. 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 These are all discussed in detail with him.  - Nutritional counseling repeated at each appointment due to patients tendency to fall back in to old habits.  - The patient admits there is a room for improvement in their diet and drink choices. -  Suggestion is made for the patient to avoid simple carbohydrates from their diet including Cakes, Sweet Desserts / Pastries, Ice Cream, Soda (diet and regular), Sweet Tea, Candies, Chips, Cookies, Sweet Pastries, Store Bought Juices, Alcohol in Excess of 1-2 drinks a day, Artificial Sweeteners, Coffee Creamer, and "Sugar-free" Products. This will help patient to have stable blood glucose profile and potentially avoid unintended weight gain.   - I encouraged the patient to switch to unprocessed or minimally processed complex starch and increased protein intake (animal or plant source), fruits, and vegetables.   - Patient is advised to stick to a routine mealtimes to eat 3 meals a day and avoid unnecessary snacks (to snack only to correct hypoglycemia).  - he will be scheduled with Jearld Fenton, RDN, CDE for diabetes education.  - I have approached him with the following individualized plan to manage his diabetes and patient agrees:   -Based on his frequent hypoglycemia, his Lantus will be reduced to 40 units SQ nightly and Humalog will be lowered to 8-14 units TID with meals if glucose is above 90 and he is eating (Specific instructions on how to titrate insulin dosage based on glucose readings given to patient in writing). He can continue Metformin 1000 mg po twice daily with meals, Glipizide 5 mg XL daily with breakfast and Ozempic 1 mg SQ weekly.  -he is encouraged to  start monitoring glucose 4 times daily, before meals and before bed, and to call the clinic if he has readings less than 70 or greater  than 300 for 3 tests in a row.  - he is warned not to take insulin without proper monitoring per orders.  - Adjustment parameters are given to him for hypo and hyperglycemia in writing.  - Specific targets for  A1c; LDL, HDL, and Triglycerides were discussed with the patient.  2) Blood Pressure /Hypertension:  his blood pressure is controlled to target.   he is advised to continue his current medications including Norvasc 5 mg p.o. daily with breakfast, HCTZ 25 mg po daily, and Losartan 100 mg po daily.  3) Lipids/Hyperlipidemia:    Review of his recent lipid panel from 07/27/20 showed controlled LDL at 55 .  he is advised to continue Lipitor 20 mg daily at bedtime.  Side effects and precautions discussed with him.  4)  Weight/Diet:  his Body mass index is 54.96 kg/m.  -  clearly complicating his diabetes care.   he is a candidate for weight loss. I discussed with him the fact that loss of 5 - 10% of his  current body weight will have the most impact on his diabetes management.  Exercise, and detailed carbohydrates information provided  -  detailed on discharge instructions.    5) Hypothyroidism- unspecified His previsit thyroid function tests are consistent with appropriate hormone replacement.  He is advised to continue Levothyroxine 137 mcg po daily before breakfast.   - The correct intake of thyroid hormone (Levothyroxine, Synthroid), is on empty stomach first thing in the morning, with water, separated by at least 30 minutes from breakfast and other medications,  and separated by more than 4 hours from calcium, iron, multivitamins, acid reflux medications (PPIs).  - This medication is a life-long medication and will be needed to correct thyroid hormone imbalances for the rest of your life.  The dose may change from time to time, based on thyroid blood  work.  - It is extremely important to be consistent taking this medication, near the same time each morning.  -AVOID TAKING PRODUCTS CONTAINING BIOTIN (commonly found in Hair, Skin, Nails vitamins) AS IT INTERFERES WITH THE VALIDITY OF THYROID FUNCTION BLOOD TESTS.  6) Chronic Care/Health Maintenance: -he is on ACEI/ARB and Statin medications and is encouraged to initiate and continue to follow up with Ophthalmology, Dentist, Podiatrist at least yearly or according to recommendations, and advised to stay away from smoking. I have recommended yearly flu vaccine and pneumonia vaccine at least every 5 years; moderate intensity exercise for up to 150 minutes weekly; and sleep for at least 7 hours a day.  - he is advised to maintain close follow up with Celene Squibb, MD for primary care needs, as well as his other providers for optimal and coordinated care.     I spent 39 minutes in the care of the patient today including review of labs from [facility], Lipids, Thyroid Function, Hematology (current and previous including abstractions from other facilities); face-to-face time discussing  his blood glucose readings/logs, discussing hypoglycemia and hyperglycemia episodes and symptoms, medications doses, his options of short and long term treatment based on the latest standards of care / guidelines;  discussion about incorporating lifestyle medicine;  and documenting the encounter.    Please refer to Patient Instructions for Blood Glucose Monitoring and Insulin/Medications Dosing Guide"  in media tab for additional information. Please  also refer to " Patient Self Inventory" in the Media  tab for reviewed elements of pertinent patient history.  Adelfa Koh participated in the discussions, expressed understanding, and voiced agreement  with the above plans.  All questions were answered to his satisfaction. he is encouraged to contact clinic should he have any questions or concerns prior to his return  visit.     Follow up plan: - Return in about 4 months (around 05/13/2021) for Diabetes F/U with A1c in office, No previsit labs, Bring meter and logs.    Rayetta Pigg, [facility] [address] [place], [place] [address] Phone: [phone] Fax: [phone]  01/10/2021, 11:16 AM

## 2021-01-10 NOTE — Patient Instructions (Signed)

## 2021-01-12 DIAGNOSIS — E785 Hyperlipidemia, unspecified: Secondary | ICD-10-CM | POA: Diagnosis not present

## 2021-01-12 DIAGNOSIS — I10 Essential (primary) hypertension: Secondary | ICD-10-CM | POA: Diagnosis not present

## 2021-01-12 LAB — BASIC METABOLIC PANEL
BUN: 22 — AB (ref 4–21)
CO2: 24 — AB (ref 13–22)
Chloride: 100 (ref 99–108)
Creatinine: 1.8 — AB (ref <=1.3)
Glucose: 114
Potassium: 4.8 (ref 3.4–5.3)
Sodium: 136 — AB (ref 137–147)

## 2021-01-12 LAB — CBC AND DIFFERENTIAL
HCT: 35 — AB (ref 41–53)
Hemoglobin: 11.9 — AB (ref 13.5–17.5)
Neutrophils Absolute: 4.5
Platelets: 216 (ref 150–399)
WBC: 6.4

## 2021-01-12 LAB — LIPID PANEL
Cholesterol: 95 (ref 0–200)
HDL: 39 (ref 35–70)
LDL Cholesterol: 43
Triglycerides: 56 (ref 40–160)

## 2021-01-12 LAB — HEPATIC FUNCTION PANEL
ALT: 15 (ref 10–40)
AST: 16 (ref 14–40)
Alkaline Phosphatase: 72 (ref 25–125)
Bilirubin, Total: 0.4

## 2021-01-12 LAB — COMPREHENSIVE METABOLIC PANEL
Albumin: 3.7 (ref 3.5–5.0)
Calcium: 8.9 (ref 8.7–10.7)
Globulin: 2.9

## 2021-01-12 LAB — CBC: RBC: 4.09 (ref 3.87–5.11)

## 2021-01-20 DIAGNOSIS — R0600 Dyspnea, unspecified: Secondary | ICD-10-CM | POA: Diagnosis not present

## 2021-01-20 DIAGNOSIS — Z23 Encounter for immunization: Secondary | ICD-10-CM | POA: Diagnosis not present

## 2021-01-20 DIAGNOSIS — E782 Mixed hyperlipidemia: Secondary | ICD-10-CM | POA: Diagnosis not present

## 2021-01-20 DIAGNOSIS — I10 Essential (primary) hypertension: Secondary | ICD-10-CM | POA: Diagnosis not present

## 2021-01-20 DIAGNOSIS — G2581 Restless legs syndrome: Secondary | ICD-10-CM | POA: Diagnosis not present

## 2021-01-20 DIAGNOSIS — D519 Vitamin B12 deficiency anemia, unspecified: Secondary | ICD-10-CM | POA: Diagnosis not present

## 2021-01-20 DIAGNOSIS — F331 Major depressive disorder, recurrent, moderate: Secondary | ICD-10-CM | POA: Diagnosis not present

## 2021-01-20 DIAGNOSIS — E039 Hypothyroidism, unspecified: Secondary | ICD-10-CM | POA: Diagnosis not present

## 2021-01-20 DIAGNOSIS — N1831 Chronic kidney disease, stage 3a: Secondary | ICD-10-CM | POA: Diagnosis not present

## 2021-01-20 DIAGNOSIS — E1165 Type 2 diabetes mellitus with hyperglycemia: Secondary | ICD-10-CM | POA: Diagnosis not present

## 2021-01-25 DIAGNOSIS — E1142 Type 2 diabetes mellitus with diabetic polyneuropathy: Secondary | ICD-10-CM | POA: Diagnosis not present

## 2021-01-25 DIAGNOSIS — B351 Tinea unguium: Secondary | ICD-10-CM | POA: Diagnosis not present

## 2021-02-07 DIAGNOSIS — G4733 Obstructive sleep apnea (adult) (pediatric): Secondary | ICD-10-CM | POA: Diagnosis not present

## 2021-02-25 DIAGNOSIS — G4733 Obstructive sleep apnea (adult) (pediatric): Secondary | ICD-10-CM | POA: Diagnosis not present

## 2021-03-02 DIAGNOSIS — E1165 Type 2 diabetes mellitus with hyperglycemia: Secondary | ICD-10-CM | POA: Diagnosis not present

## 2021-03-02 DIAGNOSIS — R0600 Dyspnea, unspecified: Secondary | ICD-10-CM | POA: Diagnosis not present

## 2021-03-02 DIAGNOSIS — D519 Vitamin B12 deficiency anemia, unspecified: Secondary | ICD-10-CM | POA: Diagnosis not present

## 2021-03-02 DIAGNOSIS — G2581 Restless legs syndrome: Secondary | ICD-10-CM | POA: Diagnosis not present

## 2021-03-02 DIAGNOSIS — I10 Essential (primary) hypertension: Secondary | ICD-10-CM | POA: Diagnosis not present

## 2021-03-02 DIAGNOSIS — F331 Major depressive disorder, recurrent, moderate: Secondary | ICD-10-CM | POA: Diagnosis not present

## 2021-03-02 DIAGNOSIS — E039 Hypothyroidism, unspecified: Secondary | ICD-10-CM | POA: Diagnosis not present

## 2021-03-02 DIAGNOSIS — N1831 Chronic kidney disease, stage 3a: Secondary | ICD-10-CM | POA: Diagnosis not present

## 2021-03-09 DIAGNOSIS — G4733 Obstructive sleep apnea (adult) (pediatric): Secondary | ICD-10-CM | POA: Diagnosis not present

## 2021-03-18 DIAGNOSIS — I10 Essential (primary) hypertension: Secondary | ICD-10-CM | POA: Diagnosis not present

## 2021-03-18 DIAGNOSIS — E782 Mixed hyperlipidemia: Secondary | ICD-10-CM | POA: Diagnosis not present

## 2021-03-22 DIAGNOSIS — E1139 Type 2 diabetes mellitus with other diabetic ophthalmic complication: Secondary | ICD-10-CM | POA: Diagnosis not present

## 2021-03-22 DIAGNOSIS — E785 Hyperlipidemia, unspecified: Secondary | ICD-10-CM | POA: Diagnosis not present

## 2021-03-22 DIAGNOSIS — I509 Heart failure, unspecified: Secondary | ICD-10-CM | POA: Diagnosis not present

## 2021-03-22 DIAGNOSIS — G4733 Obstructive sleep apnea (adult) (pediatric): Secondary | ICD-10-CM | POA: Diagnosis not present

## 2021-03-22 DIAGNOSIS — F330 Major depressive disorder, recurrent, mild: Secondary | ICD-10-CM | POA: Diagnosis not present

## 2021-03-22 DIAGNOSIS — E1142 Type 2 diabetes mellitus with diabetic polyneuropathy: Secondary | ICD-10-CM | POA: Diagnosis not present

## 2021-03-22 DIAGNOSIS — E261 Secondary hyperaldosteronism: Secondary | ICD-10-CM | POA: Diagnosis not present

## 2021-03-22 DIAGNOSIS — G2581 Restless legs syndrome: Secondary | ICD-10-CM | POA: Diagnosis not present

## 2021-03-22 DIAGNOSIS — I110 Hypertensive heart disease with heart failure: Secondary | ICD-10-CM | POA: Diagnosis not present

## 2021-03-22 DIAGNOSIS — E1169 Type 2 diabetes mellitus with other specified complication: Secondary | ICD-10-CM | POA: Diagnosis not present

## 2021-03-22 DIAGNOSIS — Z794 Long term (current) use of insulin: Secondary | ICD-10-CM | POA: Diagnosis not present

## 2021-04-05 DIAGNOSIS — B351 Tinea unguium: Secondary | ICD-10-CM | POA: Diagnosis not present

## 2021-04-05 DIAGNOSIS — E1142 Type 2 diabetes mellitus with diabetic polyneuropathy: Secondary | ICD-10-CM | POA: Diagnosis not present

## 2021-04-06 DIAGNOSIS — H43813 Vitreous degeneration, bilateral: Secondary | ICD-10-CM | POA: Diagnosis not present

## 2021-04-06 DIAGNOSIS — H3582 Retinal ischemia: Secondary | ICD-10-CM | POA: Diagnosis not present

## 2021-04-06 DIAGNOSIS — E113313 Type 2 diabetes mellitus with moderate nonproliferative diabetic retinopathy with macular edema, bilateral: Secondary | ICD-10-CM | POA: Diagnosis not present

## 2021-04-06 DIAGNOSIS — H26493 Other secondary cataract, bilateral: Secondary | ICD-10-CM | POA: Diagnosis not present

## 2021-04-09 DIAGNOSIS — G4733 Obstructive sleep apnea (adult) (pediatric): Secondary | ICD-10-CM | POA: Diagnosis not present

## 2021-04-19 DIAGNOSIS — E782 Mixed hyperlipidemia: Secondary | ICD-10-CM | POA: Diagnosis not present

## 2021-04-19 DIAGNOSIS — I10 Essential (primary) hypertension: Secondary | ICD-10-CM | POA: Diagnosis not present

## 2021-05-10 DIAGNOSIS — G4733 Obstructive sleep apnea (adult) (pediatric): Secondary | ICD-10-CM | POA: Diagnosis not present

## 2021-05-16 ENCOUNTER — Ambulatory Visit: Payer: PPO | Admitting: "Endocrinology

## 2021-05-16 ENCOUNTER — Encounter: Payer: Self-pay | Admitting: "Endocrinology

## 2021-05-16 ENCOUNTER — Ambulatory Visit: Payer: PPO | Admitting: Nurse Practitioner

## 2021-05-16 ENCOUNTER — Other Ambulatory Visit: Payer: Self-pay

## 2021-05-16 VITALS — BP 176/86 | HR 64 | Ht 73.0 in | Wt >= 6400 oz

## 2021-05-16 DIAGNOSIS — E119 Type 2 diabetes mellitus without complications: Secondary | ICD-10-CM | POA: Diagnosis not present

## 2021-05-16 DIAGNOSIS — I10 Essential (primary) hypertension: Secondary | ICD-10-CM | POA: Diagnosis not present

## 2021-05-16 DIAGNOSIS — E782 Mixed hyperlipidemia: Secondary | ICD-10-CM | POA: Diagnosis not present

## 2021-05-16 DIAGNOSIS — E039 Hypothyroidism, unspecified: Secondary | ICD-10-CM

## 2021-05-16 LAB — POCT GLYCOSYLATED HEMOGLOBIN (HGB A1C): HbA1c, POC (controlled diabetic range): 6.9 % (ref 0.0–7.0)

## 2021-05-16 NOTE — Patient Instructions (Signed)

## 2021-05-16 NOTE — Progress Notes (Signed)
Endocrinology Follow Up Note       05/16/2021, 8:20 PM   Subjective:    Patient ID: Ronald Heath, male    DOB: [birth year].  Ronald Heath is being seen in follow up after being seen in consultation for management of currently uncontrolled symptomatic diabetes requested by  Celene Squibb, MD.  He is transferring care from his previous Endocrinologist Dr. Elyse Hsu who is retiring.   Past Medical History:  Diagnosis Date   Depression    Diabetes mellitus without complication ([place])    Finger infection 12/31/2018   Hyperlipidemia    Hypertension    Hypothyroidism    Shortness of breath    with exertion   Sleep apnea    pt is supposed to wear CPAP, but doesn't wear it    Past Surgical History:  Procedure Laterality Date   AMPUTATION Left 01/01/2019   Procedure: AMPUTATION LEFT INDEX FINGER;  Surgeon: [name], MD;  Location: [facility];  Service: Orthopedics;  Laterality: Left;   CATARACT EXTRACTION W/PHACO Right 11/21/2012   Procedure: CATARACT EXTRACTION PHACO AND INTRAOCULAR LENS PLACEMENT (IOC);  Surgeon: Tonny Branch, MD;  Location: [facility];  Service: Ophthalmology;  Laterality: Right;  CDE:19.67   CATARACT EXTRACTION W/PHACO Left 12/19/2012   Procedure: CATARACT EXTRACTION PHACO AND INTRAOCULAR LENS PLACEMENT (IOC);  Surgeon: Tonny Branch, MD;  Location: [facility];  Service: Ophthalmology;  Laterality: Left;  CDE:8.00   KNEE ARTHROSCOPY Left    ROTATOR CUFF REPAIR Right     Social History   Socioeconomic History   Marital status: Widowed    Spouse name: Not on file   Number of children: Not on file   Years of education: Not on file   Highest education level: Not on file  Occupational History   Not on file  Tobacco Use   Smoking status: Former    Packs/day: 2.00    Years: 15.00    Pack years: 30.00    Types: Cigarettes   Smokeless tobacco: Never  Vaping Use   Vaping Use: Never used   Substance and Sexual Activity   Alcohol use: Yes    Alcohol/week: 2.0 standard drinks    Types: 2 Cans of beer per week   Drug use: No   Sexual activity: Not on file  Other Topics Concern   Not on file  Social History Narrative   Not on file   Social Determinants of Health   Financial Resource Strain: Not on file  Food Insecurity: Not on file  Transportation Needs: Not on file  Physical Activity: Not on file  Stress: Not on file  Social Connections: Not on file    Family History  Problem Relation Age of Onset   Diabetes Mellitus II Father    Stroke Father     Outpatient Encounter Medications as of 05/16/2021  Medication Sig   amLODipine (NORVASC) 10 MG tablet Take 5 mg by mouth daily.    aspirin EC 81 MG tablet Take 81 mg by mouth daily.   atorvastatin (LIPITOR) 20 MG tablet Take 20 mg by mouth daily.   glucose blood test strip 1 each by Other route 4 (four) times daily. Use as  instructed   hydrALAZINE (APRESOLINE) 50 MG tablet Take 50 mg by mouth 3 (three) times daily.   insulin glargine (LANTUS) 100 UNIT/ML injection Inject 40 Units into the skin at bedtime.   insulin lispro (HUMALOG) 100 UNIT/ML injection Inject 10-16 Units into the skin 3 (three) times daily before meals.   levothyroxine (SYNTHROID, LEVOTHROID) 137 MCG tablet Take 137 mcg by mouth daily before breakfast.   metFORMIN (GLUCOPHAGE) 1000 MG tablet Take 1,000 mg by mouth 2 (two) times daily with a meal.   olmesartan (BENICAR) 40 MG tablet Take 40 mg by mouth daily.   OneTouch Delica Lancets 70J MISC 1 each by Does not apply route 4 (four) times daily.   PARoxetine (PAXIL) 40 MG tablet Take 20 mg by mouth every morning.   pramipexole (MIRAPEX) 1 MG tablet Take 1 mg by mouth at bedtime.   Semaglutide, 1 MG/DOSE, (OZEMPIC, 1 MG/DOSE,) 4 MG/3ML SOPN Inject 1 mg weekly for diabetes   vitamin B-12 (CYANOCOBALAMIN) 1000 MCG tablet Take 1,000 mcg by mouth daily.   [DISCONTINUED] glipiZIDE (GLUCOTROL XL) 5 MG 24 hr  tablet Take 1 tablet (5 mg total) by mouth daily with breakfast.   [DISCONTINUED] hydrochlorothiazide (HYDRODIURIL) 25 MG tablet Take 1 tablet by mouth daily.   [DISCONTINUED] losartan (COZAAR) 100 MG tablet Take 100 mg by mouth daily. Taking half tablet daily.   No facility-administered encounter medications on file as of 05/16/2021.    ALLERGIES: No Known Allergies  VACCINATION STATUS: Immunization History  Administered Date(s) Administered   Influenza, High Dose Seasonal PF 01/16/2018   Tdap 12/30/2018    Diabetes He presents for his follow-up diabetic visit. He has type 2 diabetes mellitus. Onset time: Diagnosed at approx age of 76. His disease course has been worsening. Hypoglycemia symptoms include sweats. Pertinent negatives for hypoglycemia include no nervousness/anxiousness or tremors. Associated symptoms include foot paresthesias and weight loss. Pertinent negatives for diabetes include no fatigue. Hypoglycemia complications include nocturnal hypoglycemia. Symptoms are worsening. Diabetic complications include nephropathy, peripheral neuropathy and retinopathy. Risk factors for coronary artery disease include diabetes mellitus, dyslipidemia, family history, hypertension, male sex, obesity and sedentary lifestyle. Current diabetic treatment includes intensive insulin program and oral agent (dual therapy). He is compliant with treatment most of the time. His weight is fluctuating minimally. He is following a generally healthy diet. When asked about meal planning, he reported none. He has not had a previous visit with a dietitian. He never participates in exercise. His home blood glucose trend is decreasing steadily. His breakfast blood glucose range is generally 140-180 mg/dl. His lunch blood glucose range is generally 140-180 mg/dl. His dinner blood glucose range is generally 140-180 mg/dl. His bedtime blood glucose range is generally 140-180 mg/dl. His overall blood glucose range is  140-180 mg/dl. (He presents with controlled glycemic profile averaging between 140-175.  His previsit point-of-care A1c is 6.9%.  He did not document or report hypoglycemia.   ) An ACE inhibitor/angiotensin II receptor blocker is being taken. He sees a podiatrist.Eye exam is current.  Hyperlipidemia This is a chronic problem. The current episode started more than 1 year ago. The problem is controlled. Recent lipid tests were reviewed and are normal. Exacerbating diseases include chronic renal disease, diabetes, hypothyroidism and obesity. Factors aggravating his hyperlipidemia include fatty foods and thiazides. Current antihyperlipidemic treatment includes statins. The current treatment provides significant improvement of lipids. Compliance problems include adherence to diet and adherence to exercise.  Risk factors for coronary artery disease include diabetes mellitus, dyslipidemia, hypertension,  male sex, obesity and a sedentary lifestyle.  Hypertension This is a chronic problem. The current episode started more than 1 year ago. The problem has been gradually improving since onset. The problem is controlled. Associated symptoms include peripheral edema and sweats. Pertinent negatives include no palpitations. Agents associated with hypertension include thyroid hormones. Risk factors for coronary artery disease include diabetes mellitus, dyslipidemia, family history, obesity, male gender and sedentary lifestyle. Past treatments include calcium channel blockers, diuretics and angiotensin blockers. The current treatment provides mild improvement. Compliance problems include diet and exercise.  Hypertensive end-organ damage includes kidney disease and retinopathy. Identifiable causes of hypertension include chronic renal disease and a thyroid problem.  Thyroid Problem Presents for follow-up visit. Symptoms include weight loss. Patient reports no anxiety, cold intolerance, constipation, depressed mood, fatigue,  heat intolerance, leg swelling, palpitations, tremors or weight gain. The symptoms have been stable. His past medical history is significant for diabetes and hyperlipidemia.    Review of systems  Constitutional: + steadily decreasing body weight, current Body mass index is 53.54 kg/m.,  no subjective hyperthermia, no subjective hypothermia Eyes: no blurry vision, no xerophthalmia ENT: no sore throat, no nodules palpated in throat, no dysphagia/odynophagia, no hoarseness Cardiovascular: no chest pain, no shortness of breath, no palpitations, + ankle swelling Respiratory: no cough, no shortness of breath Gastrointestinal: no nausea/vomiting/diarrhea Musculoskeletal: right knee pain (needs TKR) Skin: no rashes, no hyperemia Neurological: no tremors, no numbness, no tingling, no dizziness Psychiatric: no depression, no anxiety  Objective:     BP (!) 176/86    Pulse 64    Ht 6\' 1"  (1.854 m)    Wt (!) 405 lb 12.8 oz (184.1 kg)    BMI 53.54 kg/m   Wt Readings from Last 3 Encounters:  05/16/21 (!) 405 lb 12.8 oz (184.1 kg)  01/10/21 (!) 416 lb 9.6 oz (189 kg)  09/08/20 (!) 432 lb (196 kg)     BP Readings from Last 3 Encounters:  05/16/21 (!) 176/86  01/10/21 (!) 152/68  09/08/20 (!) 188/79     Physical Exam- Limited  Constitutional:  Body mass index is 53.54 kg/m. , not in acute distress, normal state of mind Eyes:  EOMI, no exophthalmos Neck: Supple    CMP ( most recent) CMP     Component Value Date/Time   NA 136 (A) 01/12/2021 0000   K 4.8 01/12/2021 0000   CL 100 01/12/2021 0000   CO2 24 (A) 01/12/2021 0000   GLUCOSE 120 (H) 11/03/2019 1035   BUN 22 (A) 01/12/2021 0000   CREATININE 1.8 (A) 01/12/2021 0000   CREATININE 1.19 (H) 11/03/2019 1035   CALCIUM 8.9 01/12/2021 0000   PROT 6.5 01/29/2019 1130   ALBUMIN 3.7 01/12/2021 0000   AST 16 01/12/2021 0000   ALT 15 01/12/2021 0000   ALKPHOS 72 01/12/2021 0000   BILITOT 0.6 01/29/2019 1130   GFRNONAA 73 07/27/2020  0000   GFRNONAA 60 11/03/2019 1035   GFRAA 70 11/03/2019 1035     Diabetic Labs (most recent): Lab Results  Component Value Date   HGBA1C 6.9 05/16/2021   HGBA1C 6.7 01/10/2021   HGBA1C 6.8 07/27/2020     Lipid Panel ( most recent) Lipid Panel     Component Value Date/Time   CHOL 95 01/12/2021 0000   TRIG 56 01/12/2021 0000   HDL 39 01/12/2021 0000   LDLCALC 43 01/12/2021 0000      Lab Results  Component Value Date   TSH 1.440 01/03/2021  FREET4 1.27 01/03/2021           Assessment & Plan:   1) Diabetes mellitus without complication ([place])  He presents with controlled glycemic profile averaging between 140-175.  His previsit point-of-care A1c is 6.9%.  He did not document or report hypoglycemia.    - Ronald Heath has currently uncontrolled symptomatic type 2 DM since 76 years of age.  -Recent labs reviewed.  - I had a long discussion with him about the progressive nature of diabetes and the pathology behind its complications. 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 These are all discussed in detail with him.  - Nutritional counseling repeated at each appointment due to patients tendency to fall back in to old habits.  - he acknowledges that there is a room for improvement in his food and drink choices. - Suggestion is made for him to avoid simple carbohydrates  from his diet including Cakes, Sweet Desserts, Ice Cream, Soda (diet and regular), Sweet Tea, Candies, Chips, Cookies, Store Bought Juices, Alcohol , Artificial Sweeteners,  Coffee Creamer, and "Sugar-free" Products, Lemonade. This will help patient to have more stable blood glucose profile and potentially avoid unintended weight gain.  The following Lifestyle Medicine recommendations according to [facility]) were  discussed and and offered to patient and he  agrees to start the journey:  A. Whole Foods, Plant-Based Nutrition comprising of fruits and vegetables, plant-based proteins, whole-grain carbohydrates was discussed in detail with the patient.   A list for source of those nutrients were also provided to the patient.  Patient will use only water or unsweetened tea for hydration. B.  The need to stay away from risky substances including alcohol, smoking; obtaining 7 to 9 hours of restorative sleep, at least 150 minutes of moderate intensity exercise weekly, the importance of healthy social connections,  and stress management techniques were discussed. C.  A full color page of  Calorie density of various food groups per pound showing examples of each food groups was provided to the patient.  - Patient is advised to stick to a routine mealtimes to eat 3 meals a day and avoid unnecessary snacks (to snack only to correct hypoglycemia). - I have approached him with the following individualized plan to manage his diabetes and patient agrees:   -Based on his presentation, he is advised to continue Lantus 40 units nightly, advised to continue Humalog 10-16 units 3 times daily AC for Premeal blood glucose readings above 90 mg per DL.  At this time, he is advised to discontinue glipizide.  He would benefit from Ozempic 1 mg subcutaneously weekly, metformin 1000 mg p.o. twice daily.   -he is encouraged to start monitoring glucose 4 times daily, before meals and before bed, and to call the clinic if he has readings less than 70 or greater than 200 for 3 tests in a row.  - he is warned not to take insulin without proper monitoring per orders.  - Adjustment parameters are given to him for hypo and hyperglycemia in writing.  - Specific targets for  A1c; LDL, HDL, and Triglycerides were discussed with the patient.  2) Blood Pressure /Hypertension: -His blood pressure is not controlled to target.  he is advised to  continue his current medications including Norvasc 5 mg p.o. daily with breakfast, HCTZ 25 mg po  daily, and Losartan 100 mg po daily.  3) Lipids/Hyperlipidemia:    Review of his recent lipid panel showed controlled LDL at 43.  He is advised to continue Lipitor 20 mg p.o. daily at bedtime.     Side effects and precautions discussed with him.  4)  Weight/Diet:  his Body mass index is 53.54 kg/m.  -  clearly complicating his diabetes care.   he is a candidate for weight loss. I discussed with him the fact that loss of 5 - 10% of his  current body weight will have the most impact on his diabetes management.  Exercise, and detailed carbohydrates information provided  -  detailed on discharge instructions.    5) Hypothyroidism- unspecified His previsit thyroid function tests are consistent with appropriate hormone replacement.  He is advised to continue levothyroxine 137 mcg p.o. daily before breakfast.    - The correct intake of thyroid hormone (Levothyroxine, Synthroid), is on empty stomach first thing in the morning, with water, separated by at least 30 minutes from breakfast and other medications,  and separated by more than 4 hours from calcium, iron, multivitamins, acid reflux medications (PPIs).  - This medication is a life-long medication and will be needed to correct thyroid hormone imbalances for the rest of your life.  The dose may change from time to time, based on thyroid blood work.  - It is extremely important to be consistent taking this medication, near the same time each morning.  -AVOID TAKING PRODUCTS CONTAINING BIOTIN (commonly found in Hair, Skin, Nails vitamins) AS IT INTERFERES WITH THE VALIDITY OF THYROID FUNCTION BLOOD TESTS.  6) Chronic Care/Health Maintenance: -he is on ACEI/ARB and Statin medications and is encouraged to initiate and continue to follow up with Ophthalmology, Dentist, Podiatrist at least yearly or according to recommendations, and advised to stay away from  smoking. I have recommended yearly flu vaccine and pneumonia vaccine at least every 5 years; moderate intensity exercise for up to 150 minutes weekly; and sleep for at least 7 hours a day.  - he is advised to maintain close follow up with Celene Squibb, MD for primary care needs, as well as his other providers for optimal and coordinated care.   I spent 45 minutes in the care of the patient today including review of labs from [facility], Lipids, Thyroid Function, Hematology (current and previous including abstractions from other facilities); face-to-face time discussing  his blood glucose readings/logs, discussing hypoglycemia and hyperglycemia episodes and symptoms, medications doses, his options of short and long term treatment based on the latest standards of care / guidelines;  discussion about incorporating lifestyle medicine;  and documenting the encounter.    Please refer to Patient Instructions for Blood Glucose Monitoring and Insulin/Medications Dosing Guide"  in media tab for additional information. Please  also refer to " Patient Self Inventory" in the Media  tab for reviewed elements of pertinent patient history.  Ronald Heath participated in the discussions, expressed understanding, and voiced agreement with the above plans.  All questions were answered to his satisfaction. he is encouraged to contact clinic should he have any questions or concerns prior to his return visit.   Follow up plan: - Return in about 3 months (around 08/13/2021) for F/U with Pre-visit Labs, Meter, Logs, A1c here.Rayetta Pigg, FNP-BC [facility] [address] [place], [place] [address] Phone: [phone] Fax: [phone]  05/16/2021, 8:20 PM

## 2021-05-17 DIAGNOSIS — E782 Mixed hyperlipidemia: Secondary | ICD-10-CM | POA: Diagnosis not present

## 2021-05-17 DIAGNOSIS — I10 Essential (primary) hypertension: Secondary | ICD-10-CM | POA: Diagnosis not present

## 2021-06-07 DIAGNOSIS — G4733 Obstructive sleep apnea (adult) (pediatric): Secondary | ICD-10-CM | POA: Diagnosis not present

## 2021-06-09 DIAGNOSIS — R0600 Dyspnea, unspecified: Secondary | ICD-10-CM | POA: Diagnosis not present

## 2021-06-09 DIAGNOSIS — I10 Essential (primary) hypertension: Secondary | ICD-10-CM | POA: Diagnosis not present

## 2021-06-09 DIAGNOSIS — E1165 Type 2 diabetes mellitus with hyperglycemia: Secondary | ICD-10-CM | POA: Diagnosis not present

## 2021-06-09 DIAGNOSIS — E039 Hypothyroidism, unspecified: Secondary | ICD-10-CM | POA: Diagnosis not present

## 2021-06-09 DIAGNOSIS — D519 Vitamin B12 deficiency anemia, unspecified: Secondary | ICD-10-CM | POA: Diagnosis not present

## 2021-06-09 DIAGNOSIS — R011 Cardiac murmur, unspecified: Secondary | ICD-10-CM | POA: Diagnosis not present

## 2021-06-09 DIAGNOSIS — N1831 Chronic kidney disease, stage 3a: Secondary | ICD-10-CM | POA: Diagnosis not present

## 2021-06-09 DIAGNOSIS — G2581 Restless legs syndrome: Secondary | ICD-10-CM | POA: Diagnosis not present

## 2021-06-09 DIAGNOSIS — E782 Mixed hyperlipidemia: Secondary | ICD-10-CM | POA: Diagnosis not present

## 2021-06-09 DIAGNOSIS — F331 Major depressive disorder, recurrent, moderate: Secondary | ICD-10-CM | POA: Diagnosis not present

## 2021-06-17 DIAGNOSIS — E1165 Type 2 diabetes mellitus with hyperglycemia: Secondary | ICD-10-CM | POA: Diagnosis not present

## 2021-06-17 DIAGNOSIS — I10 Essential (primary) hypertension: Secondary | ICD-10-CM | POA: Diagnosis not present

## 2021-07-07 DIAGNOSIS — E1142 Type 2 diabetes mellitus with diabetic polyneuropathy: Secondary | ICD-10-CM | POA: Diagnosis not present

## 2021-07-07 DIAGNOSIS — B351 Tinea unguium: Secondary | ICD-10-CM | POA: Diagnosis not present

## 2021-07-08 DIAGNOSIS — G4733 Obstructive sleep apnea (adult) (pediatric): Secondary | ICD-10-CM | POA: Diagnosis not present

## 2021-07-11 ENCOUNTER — Encounter: Payer: Self-pay | Admitting: Cardiovascular Disease

## 2021-07-11 ENCOUNTER — Ambulatory Visit: Payer: PPO | Admitting: Cardiovascular Disease

## 2021-07-11 VITALS — BP 162/86 | HR 73 | Ht 73.0 in | Wt 389.8 lb

## 2021-07-11 DIAGNOSIS — I447 Left bundle-branch block, unspecified: Secondary | ICD-10-CM | POA: Diagnosis not present

## 2021-07-11 DIAGNOSIS — I6529 Occlusion and stenosis of unspecified carotid artery: Secondary | ICD-10-CM

## 2021-07-11 DIAGNOSIS — R011 Cardiac murmur, unspecified: Secondary | ICD-10-CM

## 2021-07-11 NOTE — Progress Notes (Signed)
?Cardiology Office Note:   ? ?Date:  07/11/2021  ? ?ID:  Ronald Heath, DOB [birth year], MRN [id number] ? ?PCP:  Celene Squibb, MD ?  ?[facility] Providers ?Cardiologist:   Graceanna Theissen  ?  ? ?Referring MD: Celene Squibb, MD  ? ?Chief Complaint  ?Patient presents with  ? Heart Murmur  ? ? ?History of Present Illness:   ? ?Ronald Heath is a 76 y.o. male with a hx of left bundle branch block, premature ventricular contractions, heart murmur.  We are asked to see him today by Dr. Nevada Crane for further evaluation of his heart murmur and left left bundle branch block. ? ?No CP  ?No hx of exercise  ?Did accounting work in the past  ?No yard work , limited because of knee issues  ?Has seen ortho ? ? ?Hx of obesity ?Peak weight was 454 lbs  ?Now is 389  ? ?He had a heart catheterization many years ago.  Heart cath was reportedly normal. ? ? ?Past Medical History:  ?Diagnosis Date  ? Depression   ? Diabetes mellitus without complication ([place])   ? Finger infection 12/31/2018  ? Hyperlipidemia   ? Hypertension   ? Hypothyroidism   ? Shortness of breath   ? with exertion  ? Sleep apnea   ? pt is supposed to wear CPAP, but doesn't wear it  ? ? ?Past Surgical History:  ?Procedure Laterality Date  ? AMPUTATION Left 01/01/2019  ? Procedure: AMPUTATION LEFT INDEX FINGER;  Surgeon: [name], MD;  Location: [facility];  Service: Orthopedics;  Laterality: Left;  ? CATARACT EXTRACTION W/PHACO Right 11/21/2012  ? Procedure: CATARACT EXTRACTION PHACO AND INTRAOCULAR LENS PLACEMENT (IOC);  Surgeon: Tonny Branch, MD;  Location: [facility];  Service: Ophthalmology;  Laterality: Right;  CDE:19.67  ? CATARACT EXTRACTION W/PHACO Left 12/19/2012  ? Procedure: CATARACT EXTRACTION PHACO AND INTRAOCULAR LENS PLACEMENT (IOC);  Surgeon: Tonny Branch, MD;  Location: [facility];  Service: Ophthalmology;  Laterality: Left;  CDE:8.00  ? KNEE ARTHROSCOPY Left   ? ROTATOR CUFF REPAIR Right   ? ? ?Current Medications: ?Current Meds  ?Medication Sig  ? albuterol (VENTOLIN  HFA) 108 (90 Base) MCG/ACT inhaler 2 Puff(s) By Mouth Every 4 Hours PRN  ? amLODipine (NORVASC) 10 MG tablet Take 5 mg by mouth daily.   ? aspirin EC 81 MG tablet Take 81 mg by mouth daily.  ? atorvastatin (LIPITOR) 20 MG tablet Take 20 mg by mouth daily.  ? glucose blood test strip 1 each by Other route 4 (four) times daily. Use as instructed  ? hydrALAZINE (APRESOLINE) 50 MG tablet Take 50 mg by mouth 3 (three) times daily.  ? insulin glargine (LANTUS) 100 UNIT/ML injection Inject 40 Units into the skin at bedtime.  ? insulin lispro (HUMALOG) 100 UNIT/ML injection Inject 10-16 Units into the skin 3 (three) times daily before meals.  ? levothyroxine (SYNTHROID, LEVOTHROID) 137 MCG tablet Take 137 mcg by mouth daily before breakfast.  ? metFORMIN (GLUCOPHAGE-XR) 500 MG 24 hr tablet Take 1,000 mg by mouth 2 (two) times daily.  ? olmesartan (BENICAR) 40 MG tablet Take 40 mg by mouth daily.  ? OneTouch Delica Lancets 98X MISC 1 each by Does not apply route 4 (four) times daily.  ? PARoxetine (PAXIL) 40 MG tablet Take 20 mg by mouth every morning.  ? pramipexole (MIRAPEX) 1 MG tablet Take 1 mg by mouth at bedtime.  ? Semaglutide, 1 MG/DOSE, (OZEMPIC, 1 MG/DOSE,) 4 MG/3ML SOPN Inject  1 mg weekly for diabetes  ? vitamin B-12 (CYANOCOBALAMIN) 1000 MCG tablet Take 1,000 mcg by mouth daily.  ? [DISCONTINUED] hydrALAZINE (APRESOLINE) 25 MG tablet Take 25 mg by mouth 2 (two) times daily.  ?  ? ?Allergies:   Patient has no known allergies.  ? ?Social History  ? ?Socioeconomic History  ? Marital status: Widowed  ?  Spouse name: Not on file  ? Number of children: Not on file  ? Years of education: Not on file  ? Highest education level: Not on file  ?Occupational History  ? Not on file  ?Tobacco Use  ? Smoking status: Former  ?  Packs/day: 2.00  ?  Years: 15.00  ?  Pack years: 30.00  ?  Types: Cigarettes  ? Smokeless tobacco: Never  ?Vaping Use  ? Vaping Use: Never used  ?Substance and Sexual Activity  ? Alcohol use: Yes  ?   Alcohol/week: 2.0 standard drinks  ?  Types: 2 Cans of beer per week  ? Drug use: No  ? Sexual activity: Not on file  ?Other Topics Concern  ? Not on file  ?Social History Narrative  ? Not on file  ? ?Social Determinants of Health  ? ?Financial Resource Strain: Not on file  ?Food Insecurity: Not on file  ?Transportation Needs: Not on file  ?Physical Activity: Not on file  ?Stress: Not on file  ?Social Connections: Not on file  ?  ? ?Family History: ?The patient's family history includes Diabetes Mellitus II in his father; Stroke in his father. ? ?ROS:   ?Please see the history of present illness.    ? All other systems reviewed and are negative. ? ?EKGs/Labs/Other Studies Reviewed:   ? ?The following studies were reviewed today: ? ? ?EKG:   July 11, 2021: Normal sinus rhythm with occasional premature ventricular contractions.  Left bundle branch block. ? ?Recent Labs: ?01/03/2021: TSH 1.440 ?01/12/2021: ALT 15; BUN 22; Creatinine 1.8; Hemoglobin 11.9; Platelets 216; Potassium 4.8; Sodium 136  ?Recent Lipid Panel ?   ?Component Value Date/Time  ? CHOL 95 01/12/2021 0000  ? TRIG 56 01/12/2021 0000  ? HDL 39 01/12/2021 0000  ? [place] 43 01/12/2021 0000  ? ? ? ?Risk Assessment/Calculations:   ?  ? ?    ? ?Physical Exam:   ? ?VS:  BP (!) 162/86 (BP Location: Left Arm, Patient Position: Sitting, Cuff Size: Large)   Pulse 73   Ht '6\' 1"'$  (1.854 m)   Wt (!) 389 lb 12.8 oz (176.8 kg)   SpO2 96%   BMI 51.43 kg/m?    ? ?Wt Readings from Last 3 Encounters:  ?07/11/21 (!) 389 lb 12.8 oz (176.8 kg)  ?05/16/21 (!) 405 lb 12.8 oz (184.1 kg)  ?01/10/21 (!) 416 lb 9.6 oz (189 kg)  ?  ? ?GEN:  Well nourished, well developed in no acute distress ?HEENT: Normal ?NECK: No JVD; Bilat  carotid bruits, his systolic murmur radiates to the Carotids bilaterally. ?LYMPHATICS: No lymphadenopathy ?CARDIAC: RR , 2/6 systolic murmur at LSB  ?RESPIRATORY:  Clear to auscultation without rales, wheezing or rhonchi  ?ABDOMEN: Soft, non-tender,  non-distended ?MUSCULOSKELETAL:  + chronic stasis changes in lower legs.   1-2 + pitting edema SKIN: Warm and dry ?NEUROLOGIC:  Alert and oriented x 3 ?PSYCHIATRIC:  Normal affect  ? ?ASSESSMENT:   ? ?1. Left bundle branch block (LBBB)   ?2. Heart murmur   ?3. Stenosis of carotid artery, unspecified laterality   ? ?PLAN:   ? ?  ? ?  Left bundle branch block: Patient presents with a left bundle branch block.  I like to get an echocardiogram to assess his LV function and Lexiscan Myoview study for further evaluation of possible cardiac ischemia. ?He had a heart catheterization 10 to 15 years ago that was reportedly normal.  The results were not found in our current [place] link system. ? ?2.  Cardiac murmur: He has a systolic murmur.  There is radiation up into the carotids versus  carotid bruits.  Get an echocardiogram for further evaluation. ? ?3.  Carotid bruits: We will get a carotid duplex scan. ? ?4.  Obesity: He is lost approximately 60 pounds.  His goal is to lose another 100 pounds.  His orthopedic surgeon said that he would potentially get a knee operation if he could get below 300 pounds. ? ?5.  Premature ventricular contractions: He is asymptomatic from a palpitation standpoint Was found incidentally on EKG. ? ?Shared Decision Making/Informed Consent ?The risks [chest pain, shortness of breath, cardiac arrhythmias, dizziness, blood pressure fluctuations, myocardial infarction, stroke/transient ischemic attack, nausea, vomiting, allergic reaction, radiation exposure, metallic taste sensation and life-threatening complications (estimated to be 1 in 10,000)], benefits (risk stratification, diagnosing coronary artery disease, treatment guidance) and alternatives of a nuclear stress test were discussed in detail with Mr. Sayas and he agrees to proceed.  ? ? ?Medication Adjustments/Labs and Tests Ordered: ?Current medicines are reviewed at length with the patient today.  Concerns regarding medicines are  outlined above.  ?Orders Placed This Encounter  ?Procedures  ? Cardiac Stress Test: Informed Consent Details: Physician/Practitioner Attestation; Transcribe to consent form and obtain patient signature  ? MYOCARDIAL

## 2021-07-11 NOTE — Patient Instructions (Addendum)
Medication Instructions:  ?CONTINUE Hydralazine '50mg'$  three times daily ?*If you need a refill on your cardiac medications before your next appointment, please call your pharmacy* ? ? ?Lab Work: ?NONE ?If you have labs (blood work) drawn today and your tests are completely normal, you will receive your results only by: ?MyChart Message (if you have MyChart) OR ?A paper copy in the mail ?If you have any lab test that is abnormal or we need to change your treatment, we will call you to review the results. ? ? ?Testing/Procedures: ?Leane Call Stress Test ?Your physician has requested that you have a lexiscan myoview. For further information please visit [email or web address]. Please follow instruction sheet, as given. ? ?ECHO ?Your physician has requested that you have an echocardiogram. Echocardiography is a painless test that uses sound waves to create images of your heart. It provides your doctor with information about the size and shape of your heart and how well your heart?s chambers and valves are working. This procedure takes approximately one hour. There are no restrictions for this procedure. ? ?Carotid Ultrasound ?Your physician has requested that you have a carotid duplex. This test is an ultrasound of the carotid arteries in your neck. It looks at blood flow through these arteries that supply the brain with blood. Allow one hour for this exam. There are no restrictions or special instructions. ? ? ?Follow-Up: ?At [facility], you and your health needs are our priority.  As part of our continuing mission to provide you with exceptional heart care, we have created designated Provider Care Teams.  These Care Teams include your primary Cardiologist (physician) and Advanced Practice Providers (APPs -  Physician Assistants and Nurse Practitioners) who all work together to provide you with the care you need, when you need it. ? ?We recommend signing up for the patient portal called "MyChart".  Sign up  information is provided on this After Visit Summary.  MyChart is used to connect with patients for Virtual Visits (Telemedicine).  Patients are able to view lab/test results, encounter notes, upcoming appointments, etc.  Non-urgent messages can be sent to your provider as well.   ?To learn more about what you can do with MyChart, go to [email or web address].   ? ?Your next appointment:   ?6 month(s) ? ?The format for your next appointment:   ?In Person ? ?Provider:   ?Rosine Beat, or Kathlen Mody ? ?Other Instructions ?See Separate sheet for stress test instructions ? ?  ? ?Important Information About Sugar ? ? ? ? ?  ?

## 2021-07-13 ENCOUNTER — Ambulatory Visit (HOSPITAL_COMMUNITY)
Admission: RE | Admit: 2021-07-13 | Discharge: 2021-07-13 | Disposition: A | Discharge status: Home / Self Care | Payer: PPO | Source: Ambulatory Visit | Attending: Cardiovascular Disease | Admitting: Cardiovascular Disease

## 2021-07-13 DIAGNOSIS — I6523 Occlusion and stenosis of bilateral carotid arteries: Secondary | ICD-10-CM | POA: Diagnosis not present

## 2021-07-13 DIAGNOSIS — I779 Disorder of arteries and arterioles, unspecified: Secondary | ICD-10-CM

## 2021-07-13 DIAGNOSIS — I6529 Occlusion and stenosis of unspecified carotid artery: Secondary | ICD-10-CM | POA: Diagnosis not present

## 2021-07-13 DIAGNOSIS — R011 Cardiac murmur, unspecified: Secondary | ICD-10-CM | POA: Diagnosis not present

## 2021-07-13 DIAGNOSIS — I447 Left bundle-branch block, unspecified: Secondary | ICD-10-CM | POA: Diagnosis not present

## 2021-07-13 HISTORY — DX: Disorder of arteries and arterioles, unspecified: I77.9

## 2021-07-14 ENCOUNTER — Telehealth: Payer: Self-pay

## 2021-07-14 NOTE — Telephone Encounter (Signed)
-----   Message from Thayer Headings, MD sent at 07/13/2021  9:21 PM EDT ----- ?Mild bilateral carotid artery disease ? ?

## 2021-07-14 NOTE — Telephone Encounter (Signed)
Spoke with pt's son, DPR and advised per Dr Acie Fredrickson, doppler shows mild bilateral carotid artery disease.  Pt's son verbalizes understanding and will let the pt know.  He thanked Therapist, sports for the call. ?

## 2021-07-26 ENCOUNTER — Telehealth (HOSPITAL_COMMUNITY): Payer: Self-pay | Admitting: *Deleted

## 2021-07-26 NOTE — Telephone Encounter (Signed)
Patient given detailed instructions per Myocardial Perfusion Study Information Sheet for the test on 08/01/2021 at 10:00. Patient notified to arrive 15 minutes early and that it is imperative to arrive on time for appointment to keep from having the test rescheduled. ? If you need to cancel or reschedule your appointment, please call the office within 24 hours of your appointment. . Patient verbalized understanding.Ronald Heath ? ? ?

## 2021-08-01 ENCOUNTER — Ambulatory Visit (HOSPITAL_COMMUNITY): Payer: PPO | Attending: Cardiovascular Disease

## 2021-08-01 ENCOUNTER — Ambulatory Visit (HOSPITAL_BASED_OUTPATIENT_CLINIC_OR_DEPARTMENT_OTHER): Payer: PPO

## 2021-08-01 DIAGNOSIS — I77810 Thoracic aortic ectasia: Secondary | ICD-10-CM

## 2021-08-01 DIAGNOSIS — R011 Cardiac murmur, unspecified: Secondary | ICD-10-CM

## 2021-08-01 DIAGNOSIS — I6529 Occlusion and stenosis of unspecified carotid artery: Secondary | ICD-10-CM | POA: Diagnosis not present

## 2021-08-01 DIAGNOSIS — I5020 Unspecified systolic (congestive) heart failure: Secondary | ICD-10-CM

## 2021-08-01 DIAGNOSIS — I447 Left bundle-branch block, unspecified: Secondary | ICD-10-CM

## 2021-08-01 DIAGNOSIS — I359 Nonrheumatic aortic valve disorder, unspecified: Secondary | ICD-10-CM

## 2021-08-01 DIAGNOSIS — I5022 Chronic systolic (congestive) heart failure: Secondary | ICD-10-CM

## 2021-08-01 DIAGNOSIS — I350 Nonrheumatic aortic (valve) stenosis: Secondary | ICD-10-CM

## 2021-08-01 HISTORY — DX: Nonrheumatic aortic (valve) stenosis: I35.0

## 2021-08-01 HISTORY — DX: Chronic systolic (congestive) heart failure: I50.22

## 2021-08-01 HISTORY — DX: Unspecified systolic (congestive) heart failure: I50.20

## 2021-08-01 HISTORY — DX: Thoracic aortic ectasia: I77.810

## 2021-08-01 HISTORY — DX: Nonrheumatic aortic valve disorder, unspecified: I35.9

## 2021-08-01 LAB — ECHOCARDIOGRAM COMPLETE
AR max vel: 1.44 cm2
AV Area VTI: 1.26 cm2
AV Area mean vel: 1.48 cm2
AV Mean grad: 11.5 mmHg
AV Peak grad: 20.8 mmHg
Ao pk vel: 2.28 m/s
Area-P 1/2: 2.69 cm2
S' Lateral: 4 cm

## 2021-08-01 MED ORDER — REGADENOSON 0.4 MG/5ML IV SOLN
0.4000 mg | Freq: Once | INTRAVENOUS | Status: AC
  Administered 2021-08-01: 0.4 mg via INTRAVENOUS

## 2021-08-01 MED ORDER — TECHNETIUM TC 99M TETROFOSMIN IV KIT
32.8000 | PACK | Freq: Once | INTRAVENOUS | Status: AC | PRN
  Administered 2021-08-01: 32.8 via INTRAVENOUS

## 2021-08-01 MED ORDER — PERFLUTREN LIPID MICROSPHERE
3.0000 mL | INTRAVENOUS | Status: AC | PRN
  Administered 2021-08-01: 3 mL via INTRAVENOUS

## 2021-08-02 ENCOUNTER — Telehealth: Payer: Self-pay

## 2021-08-02 ENCOUNTER — Ambulatory Visit (HOSPITAL_COMMUNITY): Payer: PPO | Attending: Internal Medicine

## 2021-08-02 DIAGNOSIS — R9430 Abnormal result of cardiovascular function study, unspecified: Secondary | ICD-10-CM

## 2021-08-02 LAB — MYOCARDIAL PERFUSION IMAGING
LV dias vol: 183 mL (ref 62–150)
LV sys vol: 121 mL
Nuc Stress EF: 34 %
Peak HR: 88 {beats}/min
Rest HR: 71 {beats}/min
Rest Nuclear Isotope Dose: 32.9 mCi
SDS: 0
SRS: 2
SSS: 2
ST Depression (mm): 0 mm
Stress Nuclear Isotope Dose: 32.8 mCi
TID: 1.04

## 2021-08-02 NOTE — Telephone Encounter (Signed)
-----   Message from Fay Records, MD sent at 08/02/2021  5:31 AM EDT ----- ?AV is narrowed moderately.   Mean gradeitn 12 mm HG   Will need to follow up with echo next January ?LV function is decreaed.   LVEF 40 to 45% ?Note that pt had stress test started today   ?

## 2021-08-02 NOTE — Telephone Encounter (Signed)
Left detailed message per DPR. Repeat ECHO order placed for January 2024.  ?

## 2021-08-11 DIAGNOSIS — E119 Type 2 diabetes mellitus without complications: Secondary | ICD-10-CM | POA: Diagnosis not present

## 2021-08-11 IMAGING — MR MR [PERSON_NAME]*[PERSON_NAME]* WO/W CM
4 of 9 series · 18 of 40 positions shown · IV contrast (gadavist)
Comparison: Plain films left hand 12/30/2018.

CLINICAL DATA: Nonhealing wounds since the patient suffered a
laceration on the left index and long fingers with Paulus N Ceejay 8
months ago. Purulent drainage for 1 week.

EXAM:
MRI OF THE LEFT HAND WITHOUT AND WITH CONTRAST
TECHNIQUE: Multiplanar, multisequence MR imaging of the left hand was performed
before and after the administration of intravenous contrast.
CONTRAST:  10 mL GADAVIST IV SOLN

[Series 5: T1 · oblique · 5.0mm · 0.31mm/px · 5 of 30 slices shown]
[im 1/30]
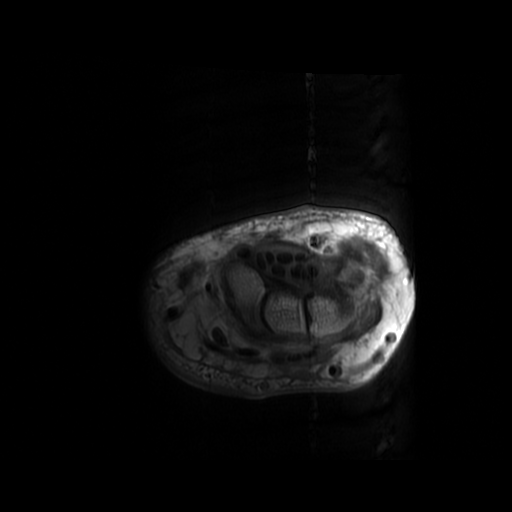
[im 6/30]
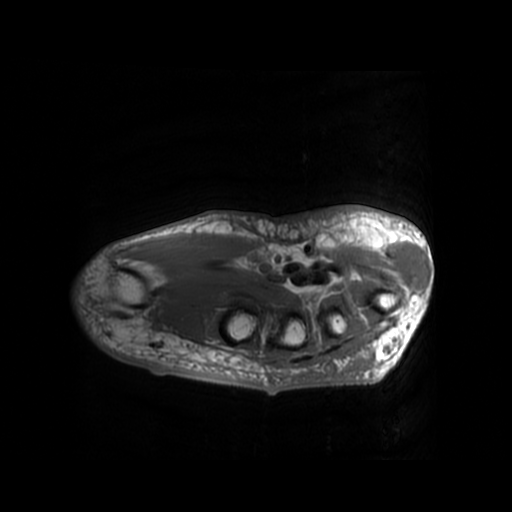
[im 12/30]
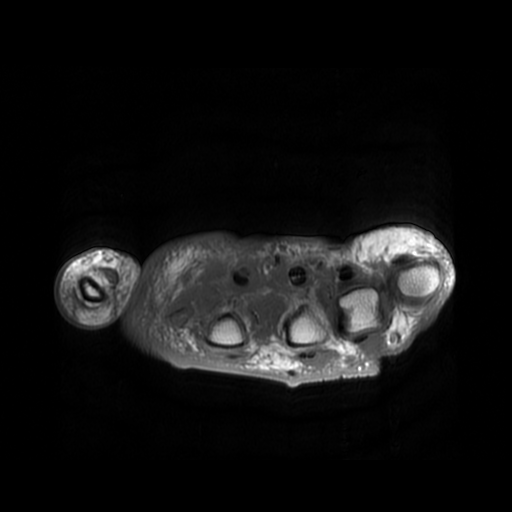
[im 18/30]
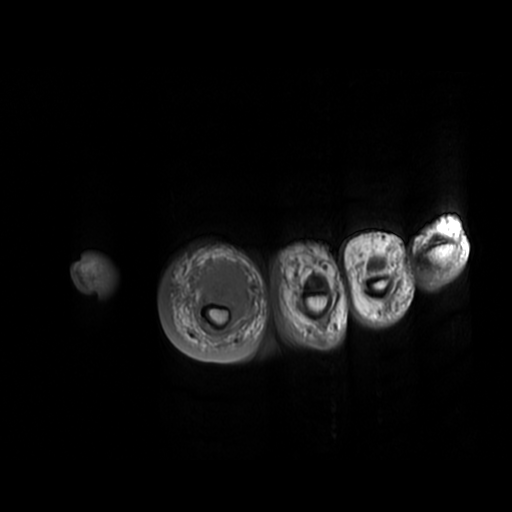
[im 30/30]
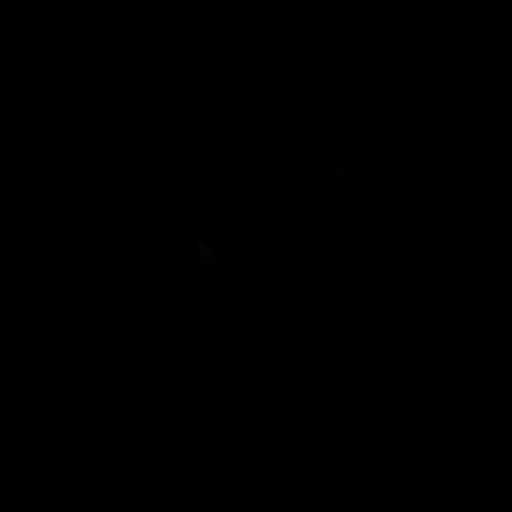

[Series 6: T2 fat-sat · oblique · 5.0mm · 0.31mm/px · 5 of 30 slices shown (1 of 3)]
[im 1/30]
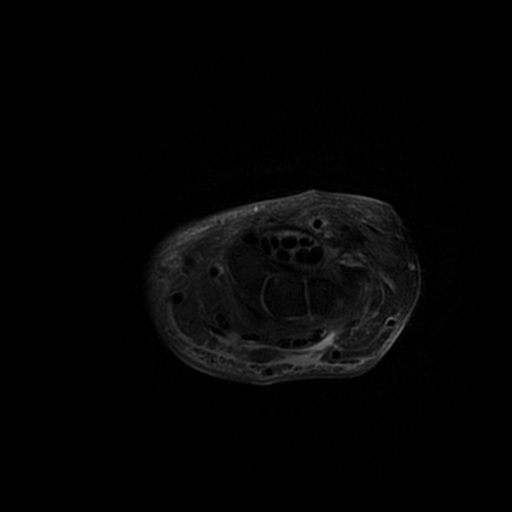
[im 8/30]
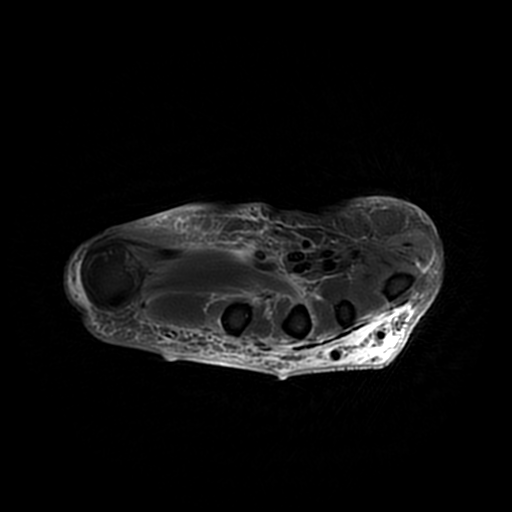
[im 15/30]
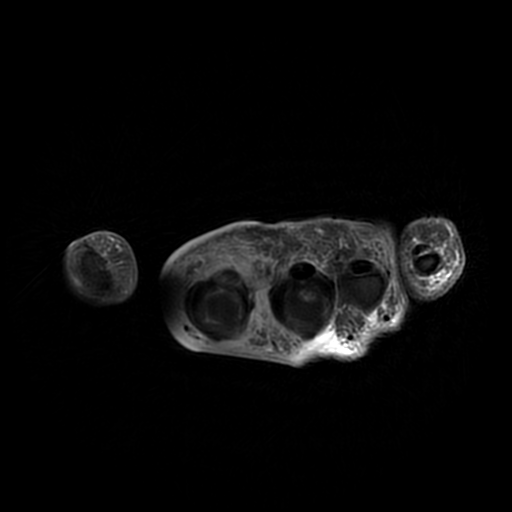
[im 22/30]
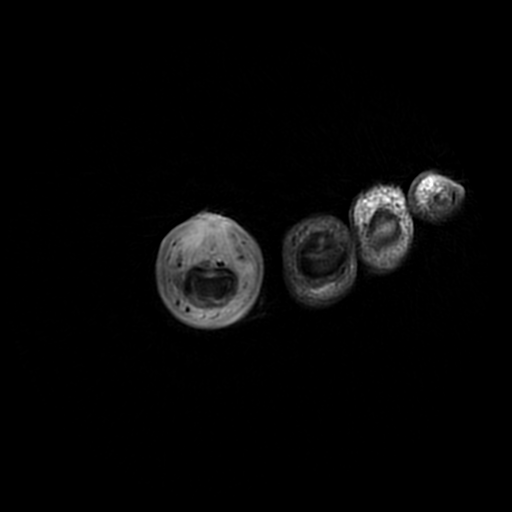
[im 30/30]
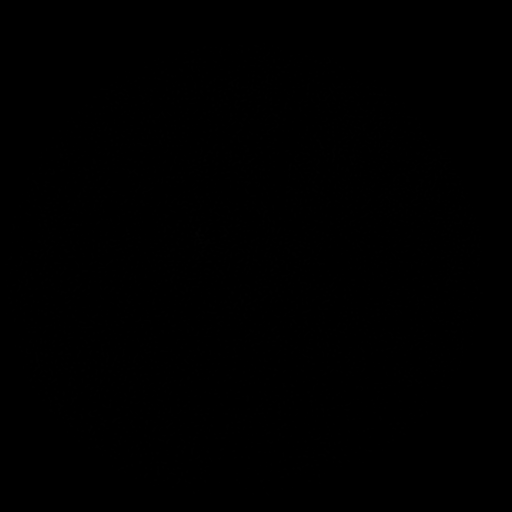

[Series 9: T2 fat-sat · coronal · 4.0mm · 0.37mm/px · 3 of 18 slices shown (2 of 3)]
[im 1/18]
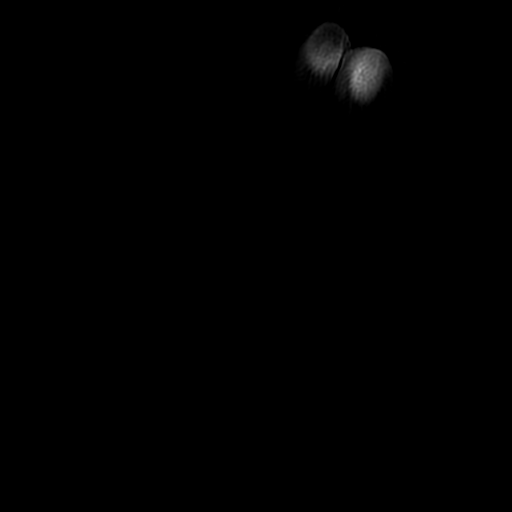
[im 9/18]
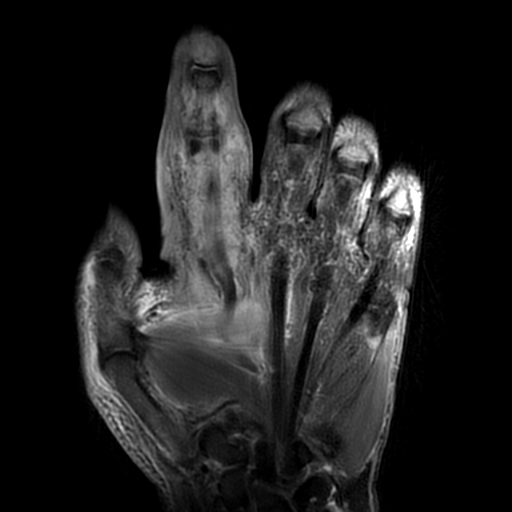
[im 18/18]
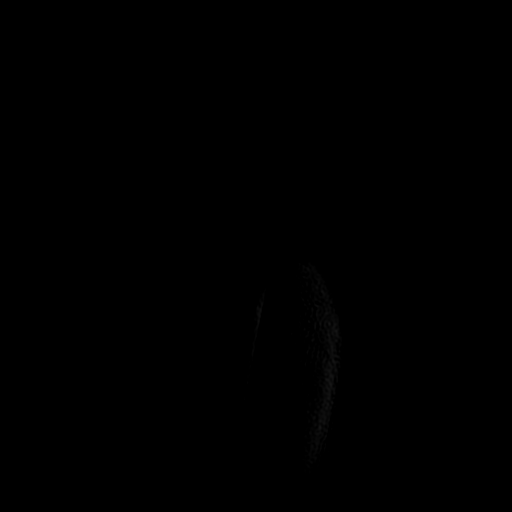

[Series 11: T2 fat-sat · oblique · 5.0mm · 0.39mm/px · 5 of 26 slices shown (3 of 3)]
[im 1/26]
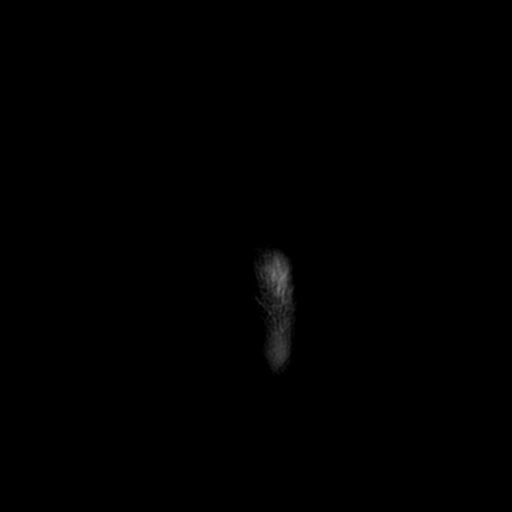
[im 7/26]
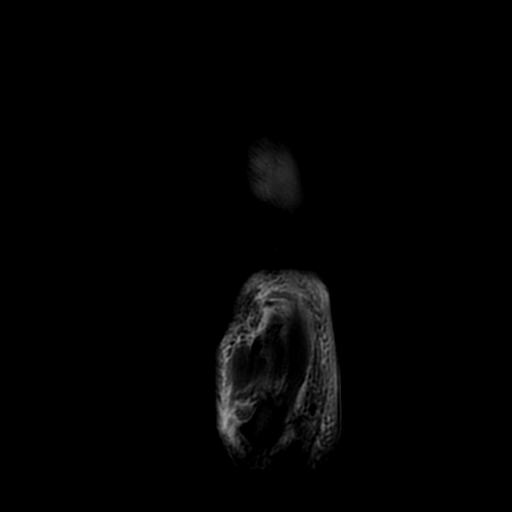
[im 13/26]
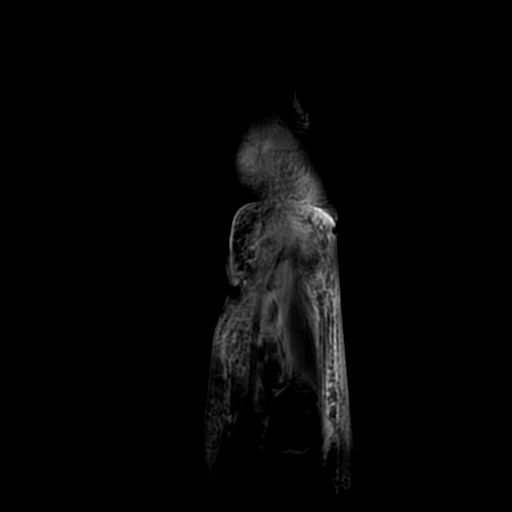
[im 19/26]
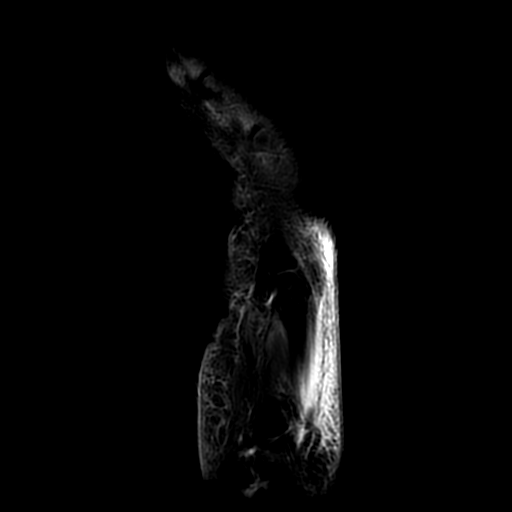
[im 26/26]
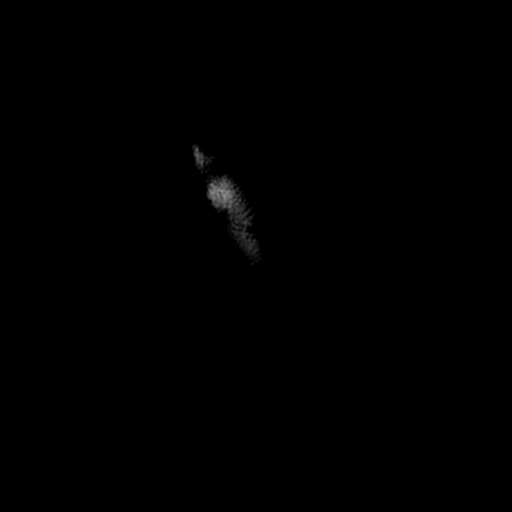

[18 of 40 positions shown; findings below may reference images not displayed]

FINDINGS: Bones/Joint/Cartilage

Decreased T1 signal, increased T2 signal and postcontrast
enhancement are seen throughout the middle and distal phalanges the
index finger consistent osteomyelitis. Osteolysis the tuft of the
index finger is identified and there is a transverse pathologic
fracture through the base of the distal phalanx. There is loss of
fat saturation in the middle and distal phalanges of the long, ring
and little fingers without corresponding decreased T1 signal to
suggest osteomyelitis. Small cyst or enchondroma in the head of the
third metacarpal is incidentally noted.

Ligaments

Intact.

Muscles and Tendons

There is edema and enhancement of the sheath of the flexor tendons
of the index finger and fluid in the sheath. The flexor digitorum
profundus is torn and retracted just proximal to the head the second
metacarpal.

Soft tissues

Extensive subcutaneous edema and enhancement are present about the
index finger consistent with cellulitis. A skin wound is seen on the
volar surface of the index finger at the level of the PIP joint. A
fluid collection measuring 0.4 cm transverse x 0.6 cm AP x 4.5 cm
craniocaudal tracks in the subcutaneous tissues from the PIP joint
to the second MCP joint and is consistent with abscess.
IMPRESSION: Intense cellulitis about the index finger with an abscess in the
volar soft tissues tracking from a skin wound at the level of the
PIP joint to approximately the level of the second MCP joint.

Osteomyelitis throughout the middle and distal phalanges of the
index finger. Osteolysis of the tuft of the distal phalanx of the
index finger and pathologic transverse fracture through the proximal
metaphysis of the distal phalanx are seen.

Septic tenosynovitis with a complete tear of the flexor digitorum
profundus tendon. The tendon is retracted just proximal to the
articular surface of the head of the second metacarpal.

## 2021-08-11 IMAGING — CR DG HAND COMPLETE 3+V*L*
3 series · 3 of 3 positions shown · non-contrast
Comparison: None

CLINICAL DATA: Left index finger pain for 2 weeks. Cut the tips of
index finger.

EXAM:
LEFT HAND - COMPLETE 3+ VIEW

[hand pa]
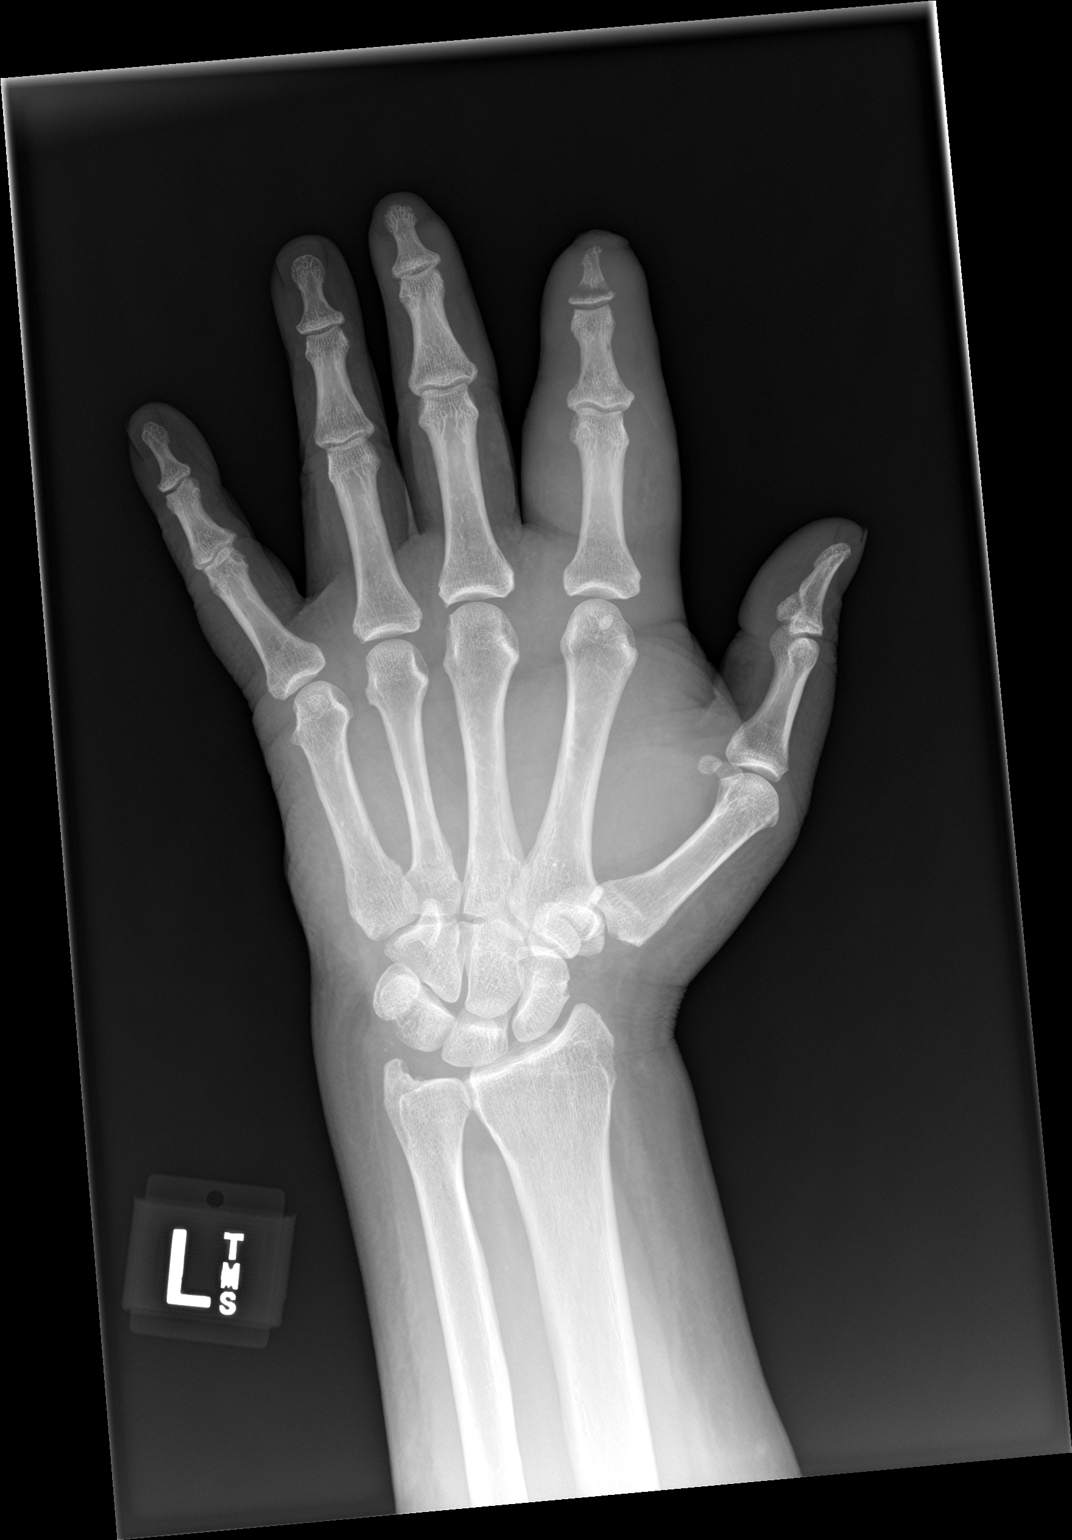

[hand obl]
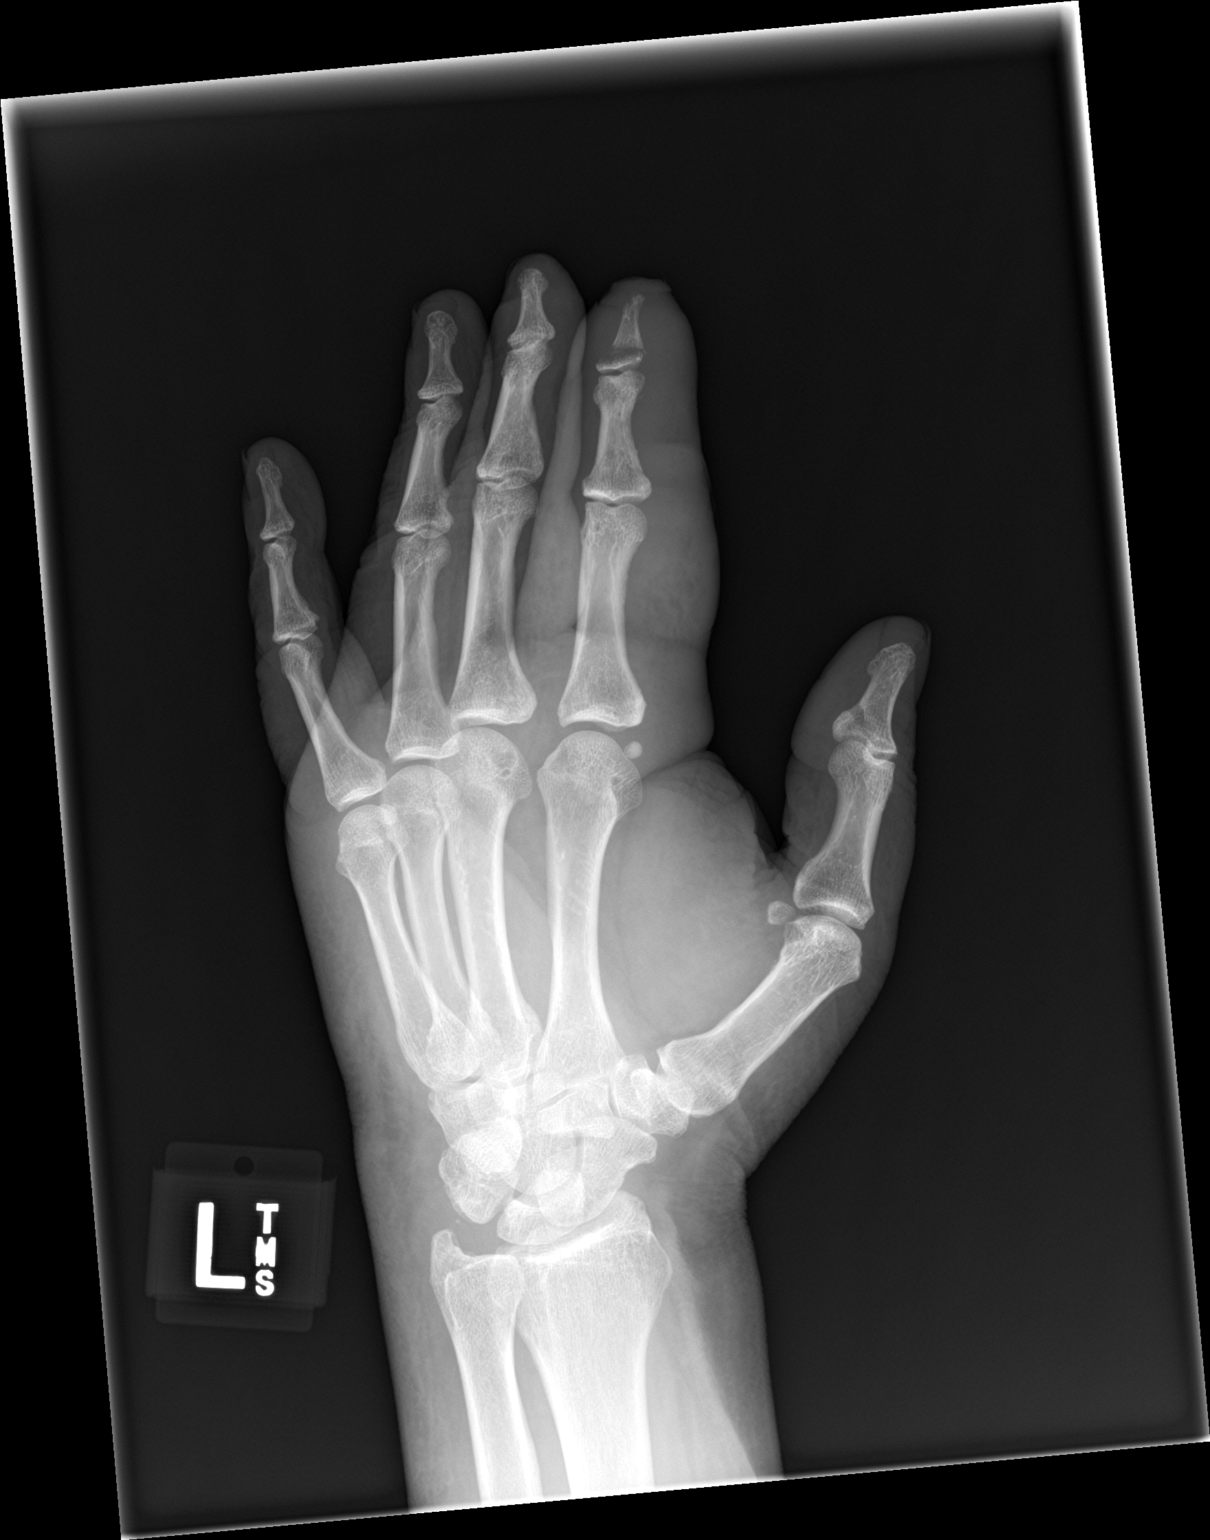

[hand lat]
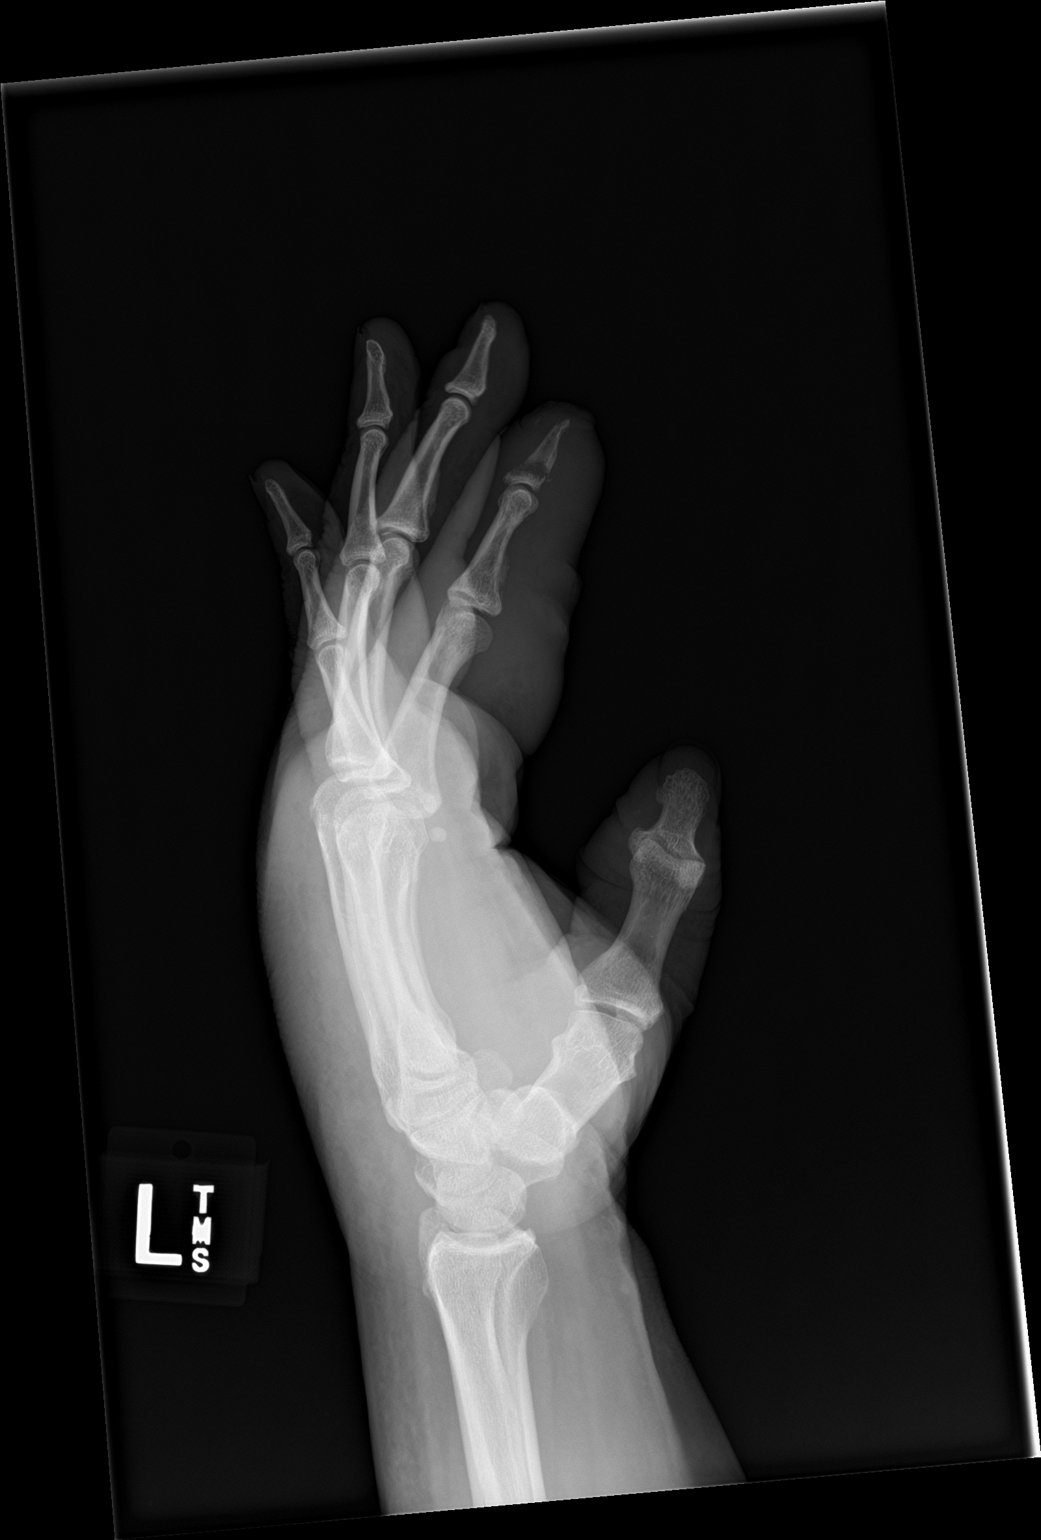

[3 of 3 positions shown; findings below may reference images not displayed]

FINDINGS: Diffuse swelling about the hand but worse in the left index finger.
Linear lucency and tuft resorption in the distal phalanx. Lucency at
the base of the distal phalanx.

Swelling involves the dorsum and volar surface of the hand
proximally.
IMPRESSION: Marked soft tissue swelling particularly about the index finger with
signs of open fracture and/or osteomyelitis of the distal phalanx.
No radiopaque foreign body.

## 2021-08-12 LAB — COMPREHENSIVE METABOLIC PANEL
ALT: 18 IU/L (ref 0–44)
AST: 18 IU/L (ref 0–40)
Albumin/Globulin Ratio: 1.4 (ref 1.2–2.2)
Albumin: 3.7 g/dL (ref 3.7–4.7)
Alkaline Phosphatase: 75 IU/L (ref 44–121)
BUN/Creatinine Ratio: 19 (ref 10–24)
BUN: 21 mg/dL (ref 8–27)
Bilirubin Total: 0.3 mg/dL (ref 0.0–1.2)
CO2: 23 mmol/L (ref 20–29)
Calcium: 8.8 mg/dL (ref 8.6–10.2)
Chloride: 104 mmol/L (ref 96–106)
Creatinine, Ser: 1.08 mg/dL (ref 0.76–1.27)
Globulin, Total: 2.6 g/dL (ref 1.5–4.5)
Glucose: 152 mg/dL — ABNORMAL HIGH (ref 70–99)
Potassium: 5.1 mmol/L (ref 3.5–5.2)
Sodium: 138 mmol/L (ref 134–144)
Total Protein: 6.3 g/dL (ref 6.0–8.5)
eGFR: 72 mL/min/{1.73_m2} (ref >59)

## 2021-08-12 LAB — LIPID PANEL
Chol/HDL Ratio: 2.2 ratio (ref 0.0–5.0)
Cholesterol, Total: 91 mg/dL — ABNORMAL LOW (ref 100–199)
HDL: 41 mg/dL (ref >39)
LDL Chol Calc (NIH): 36 mg/dL (ref 0–99)
Triglycerides: 59 mg/dL (ref 0–149)
VLDL Cholesterol Cal: 14 mg/dL (ref 5–40)

## 2021-08-12 LAB — TSH: TSH: 1.34 u[IU]/mL (ref 0.450–4.500)

## 2021-08-12 LAB — T4, FREE: Free T4: 1.46 ng/dL (ref 0.82–1.77)

## 2021-08-17 ENCOUNTER — Encounter: Payer: PPO | Admitting: Nurse Practitioner

## 2021-08-17 VITALS — Ht 73.0 in | Wt 379.0 lb

## 2021-08-17 NOTE — Patient Instructions (Signed)
Diabetes Mellitus and Foot Care Foot care is an important part of your health, especially when you have diabetes. Diabetes may cause you to have problems because of poor blood flow (circulation) to your feet and legs, which can cause your skin to: Become thinner and drier. Break more easily. Heal more slowly. Peel and crack. You may also have nerve damage (neuropathy) in your legs and feet, causing decreased feeling in them. This means that you may not notice minor injuries to your feet that could lead to more serious problems. Noticing and addressing any potential problems early is the best way to prevent future foot problems. How to care for your feet Foot hygiene  Wash your feet daily with warm water and mild soap. Do not use hot water. Then, pat your feet and the areas between your toes until they are completely dry. Do not soak your feet as this can dry your skin. Trim your toenails straight across. Do not dig under them or around the cuticle. File the edges of your nails with an emery board or nail file. Apply a moisturizing lotion or petroleum jelly to the skin on your feet and to dry, brittle toenails. Use lotion that does not contain alcohol and is unscented. Do not apply lotion between your toes. Shoes and socks Wear clean socks or stockings every day. Make sure they are not too tight. Do not wear knee-high stockings since they may decrease blood flow to your legs. Wear shoes that fit properly and have enough cushioning. Always look in your shoes before you put them on to be sure there are no objects inside. To break in new shoes, wear them for just a few hours a day. This prevents injuries on your feet. Wounds, scrapes, corns, and calluses  Check your feet daily for blisters, cuts, bruises, sores, and redness. If you cannot see the bottom of your feet, use a mirror or ask someone for help. Do not cut corns or calluses or try to remove them with medicine. If you find a minor scrape,  cut, or break in the skin on your feet, keep it and the skin around it clean and dry. You may clean these areas with mild soap and water. Do not clean the area with peroxide, alcohol, or iodine. If you have a wound, scrape, corn, or callus on your foot, look at it several times a day to make sure it is healing and not infected. Check for: Redness, swelling, or pain. Fluid or blood. Warmth. Pus or a bad smell. General tips Do not cross your legs. This may decrease blood flow to your feet. Do not use heating pads or hot water bottles on your feet. They may burn your skin. If you have lost feeling in your feet or legs, you may not know this is happening until it is too late. Protect your feet from hot and cold by wearing shoes, such as at the beach or on hot pavement. Schedule a complete foot exam at least once a year (annually) or more often if you have foot problems. Report any cuts, sores, or bruises to your health care provider immediately. Where to find more information American Diabetes Association: www.diabetes.org Association of Diabetes Care & Education Specialists: www.diabeteseducator.org Contact a health care provider if: You have a medical condition that increases your risk of infection and you have any cuts, sores, or bruises on your feet. You have an injury that is not healing. You have redness on your legs or feet. You   feel burning or tingling in your legs or feet. You have pain or cramps in your legs and feet. Your legs or feet are numb. Your feet always feel cold. You have pain around any toenails. Get help right away if: You have a wound, scrape, corn, or callus on your foot and: You have pain, swelling, or redness that gets worse. You have fluid or blood coming from the wound, scrape, corn, or callus. Your wound, scrape, corn, or callus feels warm to the touch. You have pus or a bad smell coming from the wound, scrape, corn, or callus. You have a fever. You have a red  line going up your leg. Summary Check your feet every day for blisters, cuts, bruises, sores, and redness. Apply a moisturizing lotion or petroleum jelly to the skin on your feet and to dry, brittle toenails. Wear shoes that fit properly and have enough cushioning. If you have foot problems, report any cuts, sores, or bruises to your health care provider immediately. Schedule a complete foot exam at least once a year (annually) or more often if you have foot problems. This information is not intended to replace advice given to you by your health care provider. Make sure you discuss any questions you have with your health care provider. Document Revised: 09/25/2019 Document Reviewed: 09/25/2019 Elsevier Patient Education  2023 [employer]  

## 2021-08-17 NOTE — Progress Notes (Signed)
Erroneous encounter-patient did not bring glucose readings

## 2021-09-07 NOTE — Patient Instructions (Signed)
Diabetes Mellitus and Foot Care Foot care is an important part of your health, especially when you have diabetes. Diabetes may cause you to have problems because of poor blood flow (circulation) to your feet and legs, which can cause your skin to: Become thinner and drier. Break more easily. Heal more slowly. Peel and crack. You may also have nerve damage (neuropathy) in your legs and feet, causing decreased feeling in them. This means that you may not notice minor injuries to your feet that could lead to more serious problems. Noticing and addressing any potential problems early is the best way to prevent future foot problems. How to care for your feet Foot hygiene  Wash your feet daily with warm water and mild soap. Do not use hot water. Then, pat your feet and the areas between your toes until they are completely dry. Do not soak your feet as this can dry your skin. Trim your toenails straight across. Do not dig under them or around the cuticle. File the edges of your nails with an emery board or nail file. Apply a moisturizing lotion or petroleum jelly to the skin on your feet and to dry, brittle toenails. Use lotion that does not contain alcohol and is unscented. Do not apply lotion between your toes. Shoes and socks Wear clean socks or stockings every day. Make sure they are not too tight. Do not wear knee-high stockings since they may decrease blood flow to your legs. Wear shoes that fit properly and have enough cushioning. Always look in your shoes before you put them on to be sure there are no objects inside. To break in new shoes, wear them for just a few hours a day. This prevents injuries on your feet. Wounds, scrapes, corns, and calluses  Check your feet daily for blisters, cuts, bruises, sores, and redness. If you cannot see the bottom of your feet, use a mirror or ask someone for help. Do not cut corns or calluses or try to remove them with medicine. If you find a minor scrape,  cut, or break in the skin on your feet, keep it and the skin around it clean and dry. You may clean these areas with mild soap and water. Do not clean the area with peroxide, alcohol, or iodine. If you have a wound, scrape, corn, or callus on your foot, look at it several times a day to make sure it is healing and not infected. Check for: Redness, swelling, or pain. Fluid or blood. Warmth. Pus or a bad smell. General tips Do not cross your legs. This may decrease blood flow to your feet. Do not use heating pads or hot water bottles on your feet. They may burn your skin. If you have lost feeling in your feet or legs, you may not know this is happening until it is too late. Protect your feet from hot and cold by wearing shoes, such as at the beach or on hot pavement. Schedule a complete foot exam at least once a year (annually) or more often if you have foot problems. Report any cuts, sores, or bruises to your health care provider immediately. Where to find more information American Diabetes Association: www.diabetes.org Association of Diabetes Care & Education Specialists: www.diabeteseducator.org Contact a health care provider if: You have a medical condition that increases your risk of infection and you have any cuts, sores, or bruises on your feet. You have an injury that is not healing. You have redness on your legs or feet. You   feel burning or tingling in your legs or feet. You have pain or cramps in your legs and feet. Your legs or feet are numb. Your feet always feel cold. You have pain around any toenails. Get help right away if: You have a wound, scrape, corn, or callus on your foot and: You have pain, swelling, or redness that gets worse. You have fluid or blood coming from the wound, scrape, corn, or callus. Your wound, scrape, corn, or callus feels warm to the touch. You have pus or a bad smell coming from the wound, scrape, corn, or callus. You have a fever. You have a red  line going up your leg. Summary Check your feet every day for blisters, cuts, bruises, sores, and redness. Apply a moisturizing lotion or petroleum jelly to the skin on your feet and to dry, brittle toenails. Wear shoes that fit properly and have enough cushioning. If you have foot problems, report any cuts, sores, or bruises to your health care provider immediately. Schedule a complete foot exam at least once a year (annually) or more often if you have foot problems. This information is not intended to replace advice given to you by your health care provider. Make sure you discuss any questions you have with your health care provider. Document Revised: 09/25/2019 Document Reviewed: 09/25/2019 Elsevier Patient Education  2023 [employer]  

## 2021-09-08 ENCOUNTER — Ambulatory Visit: Payer: PPO | Admitting: Nurse Practitioner

## 2021-09-08 ENCOUNTER — Encounter: Payer: Self-pay | Admitting: Nurse Practitioner

## 2021-09-08 VITALS — BP 165/82 | HR 71 | Ht 73.0 in | Wt 377.0 lb

## 2021-09-08 DIAGNOSIS — I10 Essential (primary) hypertension: Secondary | ICD-10-CM | POA: Diagnosis not present

## 2021-09-08 DIAGNOSIS — E119 Type 2 diabetes mellitus without complications: Secondary | ICD-10-CM

## 2021-09-08 DIAGNOSIS — E039 Hypothyroidism, unspecified: Secondary | ICD-10-CM | POA: Diagnosis not present

## 2021-09-08 DIAGNOSIS — E782 Mixed hyperlipidemia: Secondary | ICD-10-CM

## 2021-09-08 LAB — POCT GLYCOSYLATED HEMOGLOBIN (HGB A1C): HbA1c POC (<> result, manual entry): 7.6 % (ref 4.0–5.6)

## 2021-09-08 MED ORDER — TIRZEPATIDE 5 MG/0.5ML ~~LOC~~ SOAJ
5.0000 mg | SUBCUTANEOUS | 3 refills | Status: DC
Start: 2021-09-08 — End: 2021-09-08

## 2021-09-08 MED ORDER — TIRZEPATIDE 5 MG/0.5ML ~~LOC~~ SOAJ: 5.0000 mg | SUBCUTANEOUS | 3 refills | Status: DC

## 2021-09-08 NOTE — Progress Notes (Signed)
Endocrinology Follow Up Note       09/08/2021, 3:04 PM   Subjective:    Patient ID: Ronald Heath, male    DOB: Nov 16, 1945.  Ronald Heath is being seen in follow up after being seen in consultation for management of currently uncontrolled symptomatic diabetes requested by  Celene Squibb, MD.  He is transferring care from his previous Endocrinologist Dr. Elyse Hsu who is retiring.   Past Medical History:  Diagnosis Date   Depression    Diabetes mellitus without complication ([place])    Finger infection 12/31/2018   Hyperlipidemia    Hypertension    Hypothyroidism    Shortness of breath    with exertion   Sleep apnea    pt is supposed to wear CPAP, but doesn't wear it    Past Surgical History:  Procedure Laterality Date   AMPUTATION Left 01/01/2019   Procedure: AMPUTATION LEFT INDEX FINGER;  Surgeon: [name], MD;  Location: [facility];  Service: Orthopedics;  Laterality: Left;   CATARACT EXTRACTION W/PHACO Right 11/21/2012   Procedure: CATARACT EXTRACTION PHACO AND INTRAOCULAR LENS PLACEMENT (IOC);  Surgeon: Tonny Branch, MD;  Location: [facility];  Service: Ophthalmology;  Laterality: Right;  CDE:19.67   CATARACT EXTRACTION W/PHACO Left 12/19/2012   Procedure: CATARACT EXTRACTION PHACO AND INTRAOCULAR LENS PLACEMENT (IOC);  Surgeon: Tonny Branch, MD;  Location: [facility];  Service: Ophthalmology;  Laterality: Left;  CDE:8.00   KNEE ARTHROSCOPY Left    ROTATOR CUFF REPAIR Right     Social History   Socioeconomic History   Marital status: Widowed    Spouse name: Not on file   Number of children: Not on file   Years of education: Not on file   Highest education level: Not on file  Occupational History   Not on file  Tobacco Use   Smoking status: Former    Packs/day: 2.00    Years: 15.00    Total pack years: 30.00    Types: Cigarettes   Smokeless tobacco: Never  Vaping Use   Vaping Use: Never used   Substance and Sexual Activity   Alcohol use: Yes    Alcohol/week: 2.0 standard drinks of alcohol    Types: 2 Cans of beer per week   Drug use: No   Sexual activity: Not on file  Other Topics Concern   Not on file  Social History Narrative   Not on file   Social Determinants of Health   Financial Resource Strain: Not on file  Food Insecurity: Not on file  Transportation Needs: Not on file  Physical Activity: Not on file  Stress: Not on file  Social Connections: Not on file    Family History  Problem Relation Age of Onset   Diabetes Mellitus II Father    Stroke Father     Outpatient Encounter Medications as of 09/08/2021  Medication Sig   [DISCONTINUED] tirzepatide (MOUNJARO) 5 MG/0.5ML Pen Inject 5 mg into the skin once a week.   albuterol (VENTOLIN HFA) 108 (90 Base) MCG/ACT inhaler 2 Puff(s) By Mouth Every 4 Hours PRN   amLODipine (NORVASC) 10 MG tablet Take 5 mg by mouth daily.    aspirin EC 81  MG tablet Take 81 mg by mouth daily.   atorvastatin (LIPITOR) 20 MG tablet Take 20 mg by mouth daily.   glucose blood test strip 1 each by Other route 4 (four) times daily. Use as instructed   hydrALAZINE (APRESOLINE) 50 MG tablet Take 50 mg by mouth 3 (three) times daily.   insulin detemir (LEVEMIR FLEXPEN) 100 UNIT/ML FlexPen Inject 42 Units into the skin at bedtime.   insulin lispro (HUMALOG) 100 UNIT/ML KwikPen Inject 8-14 Units into the skin 3 (three) times daily.   levothyroxine (SYNTHROID, LEVOTHROID) 137 MCG tablet Take 137 mcg by mouth daily before breakfast.   metFORMIN (GLUCOPHAGE-XR) 500 MG 24 hr tablet Take 1,000 mg by mouth 2 (two) times daily.   olmesartan (BENICAR) 40 MG tablet Take 40 mg by mouth daily.   OneTouch Delica Lancets 95A MISC 1 each by Does not apply route 4 (four) times daily.   PARoxetine (PAXIL) 40 MG tablet Take 20 mg by mouth every morning.   pramipexole (MIRAPEX) 1 MG tablet Take 1 mg by mouth at bedtime.   tirzepatide [facility]) 5 MG/0.5ML Pen  Inject 5 mg into the skin once a week.   vitamin B-12 (CYANOCOBALAMIN) 1000 MCG tablet Take 1,000 mcg by mouth daily.   [DISCONTINUED] Semaglutide, 1 MG/DOSE, (OZEMPIC, 1 MG/DOSE,) 4 MG/3ML SOPN Inject 1 mg weekly for diabetes   No facility-administered encounter medications on file as of 09/08/2021.    ALLERGIES: No Known Allergies  VACCINATION STATUS: Immunization History  Administered Date(s) Administered   Influenza, High Dose Seasonal PF 01/16/2018   Tdap 12/30/2018    Diabetes He presents for his follow-up diabetic visit. He has type 2 diabetes mellitus. Onset time: Diagnosed at approx age of 40. His disease course has been stable. Hypoglycemia symptoms include sweats. Pertinent negatives for hypoglycemia include no nervousness/anxiousness or tremors. Associated symptoms include foot paresthesias and weight loss. Pertinent negatives for diabetes include no fatigue. There are no hypoglycemic complications. Symptoms are stable. Diabetic complications include nephropathy, peripheral neuropathy and retinopathy. Risk factors for coronary artery disease include diabetes mellitus, dyslipidemia, family history, hypertension, male sex, obesity and sedentary lifestyle. Current diabetic treatment includes intensive insulin program and oral agent (monotherapy) (and Ozempic). He is compliant with treatment most of the time. His weight is decreasing steadily. He is following a generally healthy diet. When asked about meal planning, he reported none. He has not had a previous visit with a dietitian. He never participates in exercise. His home blood glucose trend is fluctuating minimally. His breakfast blood glucose range is generally 140-180 mg/dl. His lunch blood glucose range is generally 140-180 mg/dl. His dinner blood glucose range is generally 140-180 mg/dl. His bedtime blood glucose range is generally 140-180 mg/dl. His overall blood glucose range is 140-180 mg/dl. (He presents today with his meter and  logs showing stable, slightly above target glycemic profile.  His POCT A1c today is 7.6%, increasing from last visit of 6.9%.  He did have several occasions where his glucose dropped into the 50s during the day.  Analysis of his meter shows 7-day average of 156, 14-day average of 156, 30-day average of 151, 90-day average of 147.  He can no longer afford the copay for Ozempic. ) An ACE inhibitor/angiotensin II receptor blocker is being taken. He sees a podiatrist.Eye exam is current.  Hyperlipidemia This is a chronic problem. The current episode started more than 1 year ago. The problem is controlled. Recent lipid tests were reviewed and are normal. Exacerbating diseases include chronic  renal disease, diabetes, hypothyroidism and obesity. Factors aggravating his hyperlipidemia include fatty foods and thiazides. Current antihyperlipidemic treatment includes statins. The current treatment provides significant improvement of lipids. Compliance problems include adherence to diet and adherence to exercise.  Risk factors for coronary artery disease include diabetes mellitus, dyslipidemia, hypertension, male sex, obesity and a sedentary lifestyle.  Hypertension This is a chronic problem. The current episode started more than 1 year ago. The problem has been gradually improving since onset. The problem is controlled. Associated symptoms include peripheral edema and sweats. Pertinent negatives include no palpitations. Agents associated with hypertension include thyroid hormones. Risk factors for coronary artery disease include diabetes mellitus, dyslipidemia, family history, obesity, male gender and sedentary lifestyle. Past treatments include calcium channel blockers, diuretics and angiotensin blockers. The current treatment provides mild improvement. Compliance problems include diet and exercise.  Hypertensive end-organ damage includes kidney disease and retinopathy. Identifiable causes of hypertension include chronic  renal disease and a thyroid problem.  Thyroid Problem Presents for follow-up visit. Symptoms include weight loss. Patient reports no anxiety, cold intolerance, constipation, depressed mood, fatigue, heat intolerance, leg swelling, palpitations, tremors or weight gain. The symptoms have been stable. His past medical history is significant for diabetes and hyperlipidemia.     Review of systems  Constitutional: + steadily decreasing body weight, current Body mass index is 49.74 kg/m.,  no subjective hyperthermia, no subjective hypothermia Eyes: no blurry vision, no xerophthalmia ENT: no sore throat, no nodules palpated in throat, no dysphagia/odynophagia, no hoarseness Cardiovascular: no chest pain, no shortness of breath, no palpitations, + ankle swelling Respiratory: no cough, no shortness of breath Gastrointestinal: no nausea/vomiting/diarrhea Musculoskeletal: right knee pain (needs TKR) Skin: no rashes, no hyperemia Neurological: no tremors, no numbness, no tingling, no dizziness Psychiatric: no depression, no anxiety  Objective:     BP (!) 165/82   Pulse 71   Ht '6\' 1"'$  (1.854 m)   Wt (!) 377 lb (171 kg)   BMI 49.74 kg/m   Wt Readings from Last 3 Encounters:  09/08/21 (!) 377 lb (171 kg)  08/17/21 (!) 379 lb (171.9 kg)  07/11/21 (!) 389 lb 12.8 oz (176.8 kg)     BP Readings from Last 3 Encounters:  09/08/21 (!) 165/82  07/11/21 (!) 162/86  05/16/21 (!) 176/86      Physical Exam- Limited  Constitutional:  Body mass index is 49.74 kg/m. , not in acute distress, normal state of mind Eyes:  EOMI, no exophthalmos Neck: Supple Cardiovascular: RRR, no murmurs, rubs, or gallops, no edema Respiratory: Adequate breathing efforts, no crackles, rales, rhonchi, or wheezing Musculoskeletal: no gross deformities, strength intact in all four extremities, no gross restriction of joint movements Skin:  no rashes, no hyperemia Neurological: no tremor with outstretched  hands  Diabetic Foot Exam - Simple   Simple Foot Form Diabetic Foot exam was performed with the following findings: Yes 09/08/2021  3:02 PM  Visual Inspection See comments: Yes Sensation Testing See comments: Yes Pulse Check Posterior Tibialis and Dorsalis pulse intact bilaterally: Yes Comments Decreased sensation to monofilament tool bilaterally; dry flaky skin bilaterally      CMP ( most recent) CMP     Component Value Date/Time   NA 138 08/11/2021 0825   K 5.1 08/11/2021 0825   CL 104 08/11/2021 0825   CO2 23 08/11/2021 0825   GLUCOSE 152 (H) 08/11/2021 0825   GLUCOSE 120 (H) 11/03/2019 1035   BUN 21 08/11/2021 0825   CREATININE 1.08 08/11/2021 0825   CREATININE  1.19 (H) 11/03/2019 1035   CALCIUM 8.8 08/11/2021 0825   PROT 6.3 08/11/2021 0825   ALBUMIN 3.7 08/11/2021 0825   AST 18 08/11/2021 0825   ALT 18 08/11/2021 0825   ALKPHOS 75 08/11/2021 0825   BILITOT 0.3 08/11/2021 0825   GFRNONAA 73 07/27/2020 0000   GFRNONAA 60 11/03/2019 1035   GFRAA 70 11/03/2019 1035     Diabetic Labs (most recent): Lab Results  Component Value Date   HGBA1C 7.6 09/08/2021   HGBA1C 6.9 05/16/2021   HGBA1C 6.7 01/10/2021     Lipid Panel ( most recent) Lipid Panel     Component Value Date/Time   CHOL 91 (L) 08/11/2021 0825   TRIG 59 08/11/2021 0825   HDL 41 08/11/2021 0825   CHOLHDL 2.2 08/11/2021 0825   LDLCALC 36 08/11/2021 0825   LABVLDL 14 08/11/2021 0825      Lab Results  Component Value Date   TSH 1.340 08/11/2021   TSH 1.440 01/03/2021   FREET4 1.46 08/11/2021   FREET4 1.27 01/03/2021           Assessment & Plan:   1) Diabetes mellitus without complication ([place])  He presents today with his meter and logs showing stable, slightly above target glycemic profile.  His POCT A1c today is 7.6%, increasing from last visit of 6.9%.  He did have several occasions where his glucose dropped into the 50s during the day.  Analysis of his meter shows 7-day average  of 156, 14-day average of 156, 30-day average of 151, 90-day average of 147.  He can no longer afford the copay for Ozempic.  - Ronald Heath has currently uncontrolled symptomatic type 2 DM since 76 years of age.  -Recent labs reviewed.  - I had a long discussion with him about the progressive nature of diabetes and the pathology behind its complications. 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 These are all discussed in detail with him.  - Nutritional counseling repeated at each appointment due to patients tendency to fall back in to old habits.  - The patient admits there is a room for improvement in their diet and drink choices. -  Suggestion is made for the patient to avoid simple carbohydrates from their diet including Cakes, Sweet Desserts / Pastries, Ice Cream, Soda (diet and regular), Sweet Tea, Candies, Chips, Cookies, Sweet Pastries, Store Bought Juices, Alcohol in Excess of 1-2 drinks a day, Artificial Sweeteners, Coffee Creamer, and "Sugar-free" Products. This will help patient to have stable blood glucose profile and potentially avoid unintended weight gain.   - I encouraged the patient to switch to unprocessed or minimally processed complex starch and increased protein intake (animal or plant source), fruits, and vegetables.   - Patient is advised to stick to a routine mealtimes to eat 3 meals a day and avoid unnecessary snacks (to snack only to correct hypoglycemia).  The following Lifestyle Medicine recommendations according to [place]  [facility]) were discussed and and offered to patient and he  agrees to start the journey:  A. Whole Foods, Plant-Based Nutrition comprising of fruits and vegetables, plant-based proteins, whole-grain carbohydrates was discussed in detail with the patient.   A list for  source of those nutrients were also provided to the patient.  Patient will use only water or unsweetened tea for hydration. B.  The  need to stay away from risky substances including alcohol, smoking; obtaining 7 to 9 hours of restorative sleep, at least 150 minutes of moderate intensity exercise weekly, the importance of healthy social connections,  and stress management techniques were discussed. C.  A full color page of  Calorie density of various food groups per pound showing examples of each food groups was provided to the patient.  - I have approached him with the following individualized plan to manage his diabetes and patient agrees:   -He is advised to continue his Lantus 42 units SQ nightly, Humalog 10-16 units TID with meals if glucose is above 90 and he is eating (Specific instructions on how to titrate insulin dosage based on glucose readings given to patient in writing), and Metformin 1000 mg po twice daily.  Will try switching to Mounjaro 5 mg SQ weekly to see if insurance covers it better.  This will continue to aide him in weight loss attempts as well as manage his glucose.   -he is encouraged to continue monitoring glucose 4 times daily, before meals and before bed, and to call the clinic if he has readings less than 70 or greater than 200 for 3 tests in a row.  He could greatly benefit from CGM device, especially since he has random hypoglycemia.  Sent in order to [employer] for Dexcom G7.  - he is warned not to take insulin without proper monitoring per orders.  - Adjustment parameters are given to him for hypo and hyperglycemia in writing.  - Specific targets for  A1c; LDL, HDL, and Triglycerides were discussed with the patient.  2) Blood Pressure /Hypertension: -His blood pressure is not controlled to target but is stable.  he is advised to continue his current medications including Norvasc 10 mg p.o. daily with breakfast, Hydralazine 50 mg po TID, and Olmesartan 40 mg po daily.    3) Lipids/Hyperlipidemia:    Review of his recent lipid panel from 08/11/21 showed controlled LDL at 36.  He is advised to continue Lipitor 20 mg p.o. daily at bedtime.  Side effects and precautions discussed with him.  4)  Weight/Diet:  his Body mass index is 49.74 kg/m.  -  clearly complicating his diabetes care.  he is a candidate for weight loss. I discussed with him the fact that loss of 5 - 10% of his  current body weight will have the most impact on his diabetes management.  Exercise, and detailed carbohydrates information provided  -  detailed on discharge instructions.    5) Hypothyroidism- unspecified His previsit thyroid function tests are consistent with appropriate hormone replacement.  He is advised to continue levothyroxine 137 mcg p.o. daily before breakfast.    - The correct intake of thyroid hormone (Levothyroxine, Synthroid), is on empty stomach first thing in the morning, with water, separated by at least 30 minutes from breakfast and other medications,  and separated by more than 4 hours from calcium, iron, multivitamins, acid reflux medications (PPIs).  - This medication is a life-long medication and will be needed to correct thyroid hormone imbalances for the rest of your life.  The dose may change from time to time, based on thyroid blood work.  - It is extremely important to be consistent taking this medication, near the same time each morning.  -AVOID TAKING PRODUCTS CONTAINING BIOTIN (commonly found in Hair, Skin, Nails vitamins) AS IT INTERFERES WITH THE VALIDITY OF THYROID FUNCTION BLOOD TESTS.  6) Chronic Care/Health Maintenance: -he is on ACEI/ARB  and Statin medications and is encouraged to initiate and continue to follow up with Ophthalmology, Dentist, Podiatrist at least yearly or according to recommendations, and advised to stay away from smoking. I have recommended yearly flu vaccine and pneumonia vaccine at least every 5 years; moderate intensity exercise for  up to 150 minutes weekly; and sleep for at least 7 hours a day.  - he is advised to maintain close follow up with Celene Squibb, MD for primary care needs, as well as his other providers for optimal and coordinated care.    I spent 46 minutes in the care of the patient today including review of labs from [facility], Lipids, Thyroid Function, Hematology (current and previous including abstractions from other facilities); face-to-face time discussing  his blood glucose readings/logs, discussing hypoglycemia and hyperglycemia episodes and symptoms, medications doses, his options of short and long term treatment based on the latest standards of care / guidelines;  discussion about incorporating lifestyle medicine;  and documenting the encounter.    Please refer to Patient Instructions for Blood Glucose Monitoring and Insulin/Medications Dosing Guide"  in media tab for additional information. Please  also refer to " Patient Self Inventory" in the Media  tab for reviewed elements of pertinent patient history.  Ronald Heath participated in the discussions, expressed understanding, and voiced agreement with the above plans.  All questions were answered to his satisfaction. he is encouraged to contact clinic should he have any questions or concerns prior to his return visit.   Follow up plan: - Return in about 3 months (around 12/09/2021) for Diabetes F/U with A1c in office, No previsit labs, Bring meter and logs.   Ronald Heath, [facility] [address] [place], [place] [address] Phone: [phone] Fax: [phone]  09/08/2021, 3:04 PM

## 2021-09-15 DIAGNOSIS — E1142 Type 2 diabetes mellitus with diabetic polyneuropathy: Secondary | ICD-10-CM | POA: Diagnosis not present

## 2021-09-15 DIAGNOSIS — B351 Tinea unguium: Secondary | ICD-10-CM | POA: Diagnosis not present

## 2021-09-16 DIAGNOSIS — I129 Hypertensive chronic kidney disease with stage 1 through stage 4 chronic kidney disease, or unspecified chronic kidney disease: Secondary | ICD-10-CM | POA: Diagnosis not present

## 2021-09-16 DIAGNOSIS — N1831 Chronic kidney disease, stage 3a: Secondary | ICD-10-CM | POA: Diagnosis not present

## 2021-09-16 DIAGNOSIS — E1165 Type 2 diabetes mellitus with hyperglycemia: Secondary | ICD-10-CM | POA: Diagnosis not present

## 2021-09-16 DIAGNOSIS — E08649 Diabetes mellitus due to underlying condition with hypoglycemia without coma: Secondary | ICD-10-CM | POA: Diagnosis not present

## 2021-09-16 DIAGNOSIS — E782 Mixed hyperlipidemia: Secondary | ICD-10-CM | POA: Diagnosis not present

## 2021-09-16 DIAGNOSIS — E1142 Type 2 diabetes mellitus with diabetic polyneuropathy: Secondary | ICD-10-CM | POA: Diagnosis not present

## 2021-10-13 DIAGNOSIS — E785 Hyperlipidemia, unspecified: Secondary | ICD-10-CM | POA: Diagnosis not present

## 2021-10-13 DIAGNOSIS — E1165 Type 2 diabetes mellitus with hyperglycemia: Secondary | ICD-10-CM | POA: Diagnosis not present

## 2021-10-13 DIAGNOSIS — E039 Hypothyroidism, unspecified: Secondary | ICD-10-CM | POA: Diagnosis not present

## 2021-10-14 LAB — LAB REPORT - SCANNED
A1c: 7.7
Albumin, Urine POC: 685.2
Calcium: 8.4
Creatinine, POC: 68.8 mg/dL
EGFR: 66
Free T4: 1.42 ng/dL
Microalb Creat Ratio: 996
TSH: 1.36 (ref 0.41–5.90)

## 2021-10-16 DIAGNOSIS — E08649 Diabetes mellitus due to underlying condition with hypoglycemia without coma: Secondary | ICD-10-CM | POA: Diagnosis not present

## 2021-10-16 DIAGNOSIS — E1142 Type 2 diabetes mellitus with diabetic polyneuropathy: Secondary | ICD-10-CM | POA: Diagnosis not present

## 2021-10-20 DIAGNOSIS — R0600 Dyspnea, unspecified: Secondary | ICD-10-CM | POA: Diagnosis not present

## 2021-10-20 DIAGNOSIS — F331 Major depressive disorder, recurrent, moderate: Secondary | ICD-10-CM | POA: Diagnosis not present

## 2021-10-20 DIAGNOSIS — M868X4 Other osteomyelitis, hand: Secondary | ICD-10-CM | POA: Diagnosis not present

## 2021-10-20 DIAGNOSIS — E039 Hypothyroidism, unspecified: Secondary | ICD-10-CM | POA: Diagnosis not present

## 2021-10-20 DIAGNOSIS — L089 Local infection of the skin and subcutaneous tissue, unspecified: Secondary | ICD-10-CM | POA: Diagnosis not present

## 2021-10-20 DIAGNOSIS — E782 Mixed hyperlipidemia: Secondary | ICD-10-CM | POA: Diagnosis not present

## 2021-10-20 DIAGNOSIS — R809 Proteinuria, unspecified: Secondary | ICD-10-CM | POA: Diagnosis not present

## 2021-10-20 DIAGNOSIS — I10 Essential (primary) hypertension: Secondary | ICD-10-CM | POA: Diagnosis not present

## 2021-10-20 DIAGNOSIS — E1165 Type 2 diabetes mellitus with hyperglycemia: Secondary | ICD-10-CM | POA: Diagnosis not present

## 2021-10-20 DIAGNOSIS — N1831 Chronic kidney disease, stage 3a: Secondary | ICD-10-CM | POA: Diagnosis not present

## 2021-10-27 ENCOUNTER — Ambulatory Visit (INDEPENDENT_AMBULATORY_CARE_PROVIDER_SITE_OTHER): Payer: PPO | Admitting: Internal Medicine

## 2021-10-27 ENCOUNTER — Other Ambulatory Visit: Payer: Self-pay

## 2021-10-27 ENCOUNTER — Encounter: Payer: Self-pay | Admitting: Internal Medicine

## 2021-10-27 VITALS — BP 168/63 | HR 71 | Temp 97.7°F | Wt 370.0 lb

## 2021-10-27 DIAGNOSIS — M869 Osteomyelitis, unspecified: Secondary | ICD-10-CM

## 2021-10-27 NOTE — Progress Notes (Signed)
[place] for Infectious Disease  Reason for Consult: Chronic osteomyelitis of left long finger Referring Provider: Dr. 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  Cultures from his left index finger in 2020 grew MSSA, Eikenella, Prevotella and other mixed anaerobic flora.  I favor stopping his empiric trimethoprim/sulfamethoxazole so that we can get optimal cultures to guide future antibiotic therapy.  Even if he undergoes amputation he will need postoperative antibiotics to assure effective wound healing.  He is in agreement with that plan.  Plan: Discontinue trimethoprim/sulfamethoxazole Evaluation by Dr. Grandville Silos next Tuesday, 11/01/2021 Follow-up here on 11/15/2021 (he will be away at [facility] from 11/04/2021 - 11/11/2021)  Patient Active Problem List   Diagnosis Date Noted   Chronic osteomyelitis of left hand [facility])     Priority: High   Morbid obesity ([place]) 05/16/2021   MSSA (methicillin susceptible Staphylococcus aureus) infection    Finger infection-Left index finger 12/30/2018   RLS (restless legs syndrome) 12/30/2018   Hypothyroidism    Primary hypertension    Mixed hyperlipidemia    Diabetes mellitus without complication ([place])    Depression    AKI (acute kidney injury) ([place])    Lactic acidosis    OSA on CPAP     Patient's Medications  New Prescriptions   No medications on file  Previous Medications   ALBUTEROL (VENTOLIN HFA) 108 (90 BASE) MCG/ACT INHALER    2 Puff(s) By Mouth Every 4 Hours PRN   AMLODIPINE (NORVASC) 10 MG TABLET    Take 5 mg by mouth daily.    ASPIRIN EC 81 MG TABLET    Take 81 mg by mouth daily.   ATORVASTATIN (LIPITOR) 20 MG TABLET    Take 20 mg by mouth daily.   FARXIGA 10 MG TABS TABLET    Take 10 mg by mouth daily.   GLUCOSE BLOOD TEST STRIP    1 each by Other route 4 (four) times daily. Use as instructed   HYDRALAZINE (APRESOLINE) 50 MG TABLET    Take 50 mg by mouth 3 (three) times  daily.   INSULIN DETEMIR (LEVEMIR FLEXPEN) 100 UNIT/ML FLEXPEN    Inject 42 Units into the skin at bedtime.   INSULIN LISPRO (HUMALOG) 100 UNIT/ML KWIKPEN    Inject 8-14 Units into the skin 3 (three) times daily.   LEVOTHYROXINE (SYNTHROID, LEVOTHROID) 137 MCG TABLET    Take 137 mcg by mouth daily before breakfast.   METFORMIN (GLUCOPHAGE-XR) 500 MG 24 HR TABLET    Take 1,000 mg by mouth 2 (two) times daily.   OLMESARTAN (BENICAR) 40 MG TABLET    Take 40 mg by mouth daily.   ONETOUCH DELICA LANCETS 10C MISC    1 each by Does not apply route 4 (four) times daily.   PAROXETINE (PAXIL) 40 MG TABLET    Take 20 mg by mouth every morning.   PRAMIPEXOLE (MIRAPEX) 1 MG TABLET    Take 1 mg by mouth at bedtime.   TIRZEPATIDE (MOUNJARO) 5 MG/0.5ML PEN    Inject 5 mg into the skin once a week.   VITAMIN B-12 (CYANOCOBALAMIN) 1000 MCG TABLET    Take 1,000 mcg by mouth daily.  Modified Medications   No medications on file  Discontinued Medications   SULFAMETHOXAZOLE-TRIMETHOPRIM (BACTRIM DS) 800-160 MG TABLET    Take 1 tablet by mouth 2 (two) times daily.    HPI: Ronald Heath is a 76 y.o. male with  diabetes who is struggled with left hand infections for several years.  He developed infection of his left index finger in 2020 and required amputation.  He was seen by my partner, Dr. Tommy Medal, at that time.  He received a 6-week course of IV antibiotic therapy followed by several months of oral amoxicillin/clavulanate.  It appears that that was stopped at the time of his last visit with Dr. Vaughan Browner in August 2021.  He had also had some infection of the tip of his left long finger.  A plain x-ray done in December 2020 showed osteolysis compatible with osteomyelitis of the distal tuft of the left long finger.  Picture taken in August 2021 showed what appeared to be some chronic swelling and redness around the left long finger nailbed with a dystrophic fingernail.  He says that his finger has just been  progressively swelling over the past 2 years.  He has only slight discomfort when he tries to flex the finger.  He has lost the fingernail.  He has had persistent bloody drainage from the tip of his finger and several weeks ago a piece of bone worked its way out of the tip of his finger.  He saw his PCP, Dr. Stephens Shire call, 1 week ago who started him on empiric trimethoprim/sulfamethoxazole.  He is scheduled to see his hand surgeon, Dr. [name], on 11/01/2021.  He says that he expects Dr. Grandville Silos to recommend amputation of the finger.  He is right-handed.  Review of Systems: Review of Systems  Constitutional:  Negative for chills, diaphoresis and fever.  Musculoskeletal:  Positive for joint pain.      Past Medical History:  Diagnosis Date   Depression    Diabetes mellitus without complication ([place])    Finger infection 12/31/2018   Hyperlipidemia    Hypertension    Hypothyroidism    Shortness of breath    with exertion   Sleep apnea    pt is supposed to wear CPAP, but doesn't wear it    Social History   Tobacco Use   Smoking status: Former    Packs/day: 2.00    Years: 15.00    Total pack years: 30.00    Types: Cigarettes   Smokeless tobacco: Never  Vaping Use   Vaping Use: Never used  Substance Use Topics   Alcohol use: Yes    Alcohol/week: 2.0 standard drinks of alcohol    Types: 2 Cans of beer per week   Drug use: No    Family History  Problem Relation Age of Onset   Diabetes Mellitus II Father    Stroke Father    No Known Allergies  OBJECTIVE: Vitals:   10/27/21 0859  Weight: (!) 370 lb (167.8 kg)   Body mass index is 48.82 kg/m.   Physical Exam Constitutional:      Comments: He is cracking jokes and in good spirits.  Musculoskeletal:     Comments: His left index finger is surgically absent.  His left long finger is diffusely swollen with slight erythema.  There is bloody drainage from the tip of his finger.        Microbiology: No results  found for this or any previous visit (from the past 240 hour(s)).  Michel Bickers, MD [place] for Infectious [facility] [phone] pager   [phone] cell 10/27/2021, 9:18 AM

## 2021-11-01 DIAGNOSIS — M86142 Other acute osteomyelitis, left hand: Secondary | ICD-10-CM | POA: Diagnosis not present

## 2021-11-03 ENCOUNTER — Telehealth: Payer: Self-pay | Admitting: *Deleted

## 2021-11-03 NOTE — Telephone Encounter (Signed)
   Name: Ronald Heath  DOB: [birth year]  MRN: [id number]  Primary Cardiologist: None   Preoperative team, please contact this patient and set up a phone call appointment for further preoperative risk assessment. Please obtain consent and complete medication review. Thank you for your help.  I confirm that guidance regarding antiplatelet and oral anticoagulation therapy has been completed and, if necessary, noted below.  Per office protocol, if patient is without any new symptoms or concerns at the time of their virtual visit, he may hold Aspirin for 5-7 days prior to procedure. Please resume Aspirin as soon as possible postprocedure, at the discretion of the surgeon.    Lenna Sciara, NP 11/03/2021, 11:25 AM [place]

## 2021-11-03 NOTE — Telephone Encounter (Signed)
Patient is returning call. Please advise? 

## 2021-11-03 NOTE — Telephone Encounter (Signed)
   Pre-operative Risk Assessment    Patient Name: Ronald Heath  DOB: 1945-12-04 MRN: [id number]      Request for Surgical Clearance    Procedure:   LEFT MIDDLE FINGER AMUPTATION THROUGH METACARPOPHALANGEAL JOINT  Date of Surgery:  Clearance 11/18/21                                 Surgeon:  DAVID A. Grandville Silos, MD Surgeon's Group or Practice Name:  [facility] Phone number:  [phone] Fax number:  [phone]   Type of Clearance Requested:   - Medical  - Pharmacy:  Hold Aspirin NOT INDICATED   Type of Anesthesia:   MAC W/ PRE-OP BLOCK   Additional requests/questions:    [name]   11/03/2021, 7:27 AM

## 2021-11-03 NOTE — Telephone Encounter (Signed)
1st attempt to reach pt regarding surgical clearance and the need for a tele visit.  Left a message for pt to call back and ask for the preop team. 

## 2021-11-04 ENCOUNTER — Telehealth: Payer: Self-pay

## 2021-11-04 NOTE — Telephone Encounter (Signed)
Patient is scheduled for a tele visit on 11/19/21 for preop clearance.

## 2021-11-04 NOTE — Telephone Encounter (Signed)
  Patient Consent for Virtual Visit         Ronald Heath has provided verbal consent on 11/04/2021 for a virtual visit (video or telephone).   CONSENT FOR VIRTUAL VISIT FOR:  Ronald Heath  By participating in this virtual visit I agree to the following:  I hereby voluntarily request, consent and authorize [facility] and its employed or contracted physicians, physician assistants, nurse practitioners or other licensed health care professionals (the Practitioner), to provide me with telemedicine health care services (the "Services") as deemed necessary by the treating Practitioner. I acknowledge and consent to receive the Services by the Practitioner via telemedicine. I understand that the telemedicine visit will involve communicating with the Practitioner through live audiovisual communication technology and the disclosure of certain medical information by electronic transmission. I acknowledge that I have been given the opportunity to request an in-person assessment or other available alternative prior to the telemedicine visit and am voluntarily participating in the telemedicine visit.  I understand that I have the right to withhold or withdraw my consent to the use of telemedicine in the course of my care at any time, without affecting my right to future care or treatment, and that the Practitioner or I may terminate the telemedicine visit at any time. I understand that I have the right to inspect all information obtained and/or recorded in the course of the telemedicine visit and may receive copies of available information for a reasonable fee.  I understand that some of the potential risks of receiving the Services via telemedicine include:  Delay or interruption in medical evaluation due to technological equipment failure or disruption; Information transmitted may not be sufficient (e.g. poor resolution of images) to allow for appropriate medical decision making by the  Practitioner; and/or  In rare instances, security protocols could fail, causing a breach of personal health information.  Furthermore, I acknowledge that it is my responsibility to provide information about my medical history, conditions and care that is complete and accurate to the best of my ability. I acknowledge that Practitioner's advice, recommendations, and/or decision may be based on factors not within their control, such as incomplete or inaccurate data provided by me or distortions of diagnostic images or specimens that may result from electronic transmissions. I understand that the practice of medicine is not an exact science and that Practitioner makes no warranties or guarantees regarding treatment outcomes. I acknowledge that a copy of this consent can be made available to me via my patient portal ([facility]), or I can request a printed copy by calling the office of [facility].    I understand that my insurance will be billed for this visit.   I have read or had this consent read to me. I understand the contents of this consent, which adequately explains the benefits and risks of the Services being provided via telemedicine.  I have been provided ample opportunity to ask questions regarding this consent and the Services and have had my questions answered to my satisfaction. I give my informed consent for the services to be provided through the use of telemedicine in my medical care

## 2021-11-09 NOTE — Telephone Encounter (Signed)
Ronald Heath from [facility] called stating that the surgery has been moved up to 11/16/21.

## 2021-11-09 NOTE — Telephone Encounter (Signed)
Pt has tele pre op appt 11/11/21.

## 2021-11-10 ENCOUNTER — Telehealth: Payer: Self-pay

## 2021-11-10 NOTE — Telephone Encounter (Signed)
Spoke with Colletta Maryland at [facility], she will fax over office notes. Asked her to please send cultures as well if any were done, she will look and send over any she finds.   Beryle Flock, RN

## 2021-11-10 NOTE — Telephone Encounter (Signed)
Dr. Corliss Coggeshall Salon would like office notes and any cultures performed faxed to him for review before the patient's appointment on 8/29 from Dr. [name] at [facility].   Called Dr. Biagio Borg office, no answer and unable to leave a message.   P: [phone]  Beryle Flock, RN

## 2021-11-10 NOTE — Telephone Encounter (Signed)
Received office note, no cultures received. Placed in provider's box for review.   Beryle Flock, RN

## 2021-11-11 ENCOUNTER — Ambulatory Visit (INDEPENDENT_AMBULATORY_CARE_PROVIDER_SITE_OTHER): Payer: PPO | Admitting: Physician Assistant

## 2021-11-11 DIAGNOSIS — Z01810 Encounter for preprocedural cardiovascular examination: Secondary | ICD-10-CM | POA: Diagnosis not present

## 2021-11-11 NOTE — Progress Notes (Signed)
Virtual Visit via Telephone Note   Because of Ronald Heath's co-morbid illnesses, he is at least at moderate risk for complications without adequate follow up.  This format is felt to be most appropriate for this patient at this time.  The patient did not have access to video technology/had technical difficulties with video requiring transitioning to audio format only (telephone).  All issues noted in this document were discussed and addressed.  No physical exam could be performed with this format.  Please refer to the patient's chart for his consent to telehealth for [facility].  Evaluation Performed:  Preoperative cardiovascular risk assessment _____________   Date:  11/11/2021   Patient ID:  Ronald Heath, Ronald Heath, Ronald Heath, MRN [id number] Patient Location:  Home Provider location:   Office  Primary Care Provider:  Celene Squibb, MD Primary Cardiologist:  None  Chief Complaint / Patient Profile   76 y.o. y/o male with a h/o left bundle branch block, premature ventricular contractions, heart murmur who is pending left middle finger amputation through metacarpophalangeal joint and presents today for telephonic preoperative cardiovascular risk assessment.  Past Medical History    Past Medical History:  Diagnosis Date   Depression    Diabetes mellitus without complication ([place])    Finger infection 12/31/2018   Hyperlipidemia    Hypertension    Hypothyroidism    Shortness of breath    with exertion   Sleep apnea    pt is supposed to wear CPAP, but doesn't wear it   Past Surgical History:  Procedure Laterality Date   AMPUTATION Left 01/01/2019   Procedure: AMPUTATION LEFT INDEX FINGER;  Surgeon: [name], MD;  Location: [facility];  Service: Orthopedics;  Laterality: Left;   CATARACT EXTRACTION W/PHACO Right 11/21/2012   Procedure: CATARACT EXTRACTION PHACO AND INTRAOCULAR LENS PLACEMENT (IOC);  Surgeon: Tonny Branch, MD;  Location: [facility];  Service: Ophthalmology;   Laterality: Right;  CDE:19.67   CATARACT EXTRACTION W/PHACO Left 12/19/2012   Procedure: CATARACT EXTRACTION PHACO AND INTRAOCULAR LENS PLACEMENT (IOC);  Surgeon: Tonny Branch, MD;  Location: [facility];  Service: Ophthalmology;  Laterality: Left;  CDE:8.00   KNEE ARTHROSCOPY Left    ROTATOR CUFF REPAIR Right     Allergies  No Known Allergies  History of Present Illness    Ronald Heath is a 76 y.o. male who presents via audio/video conferencing for a telehealth visit today.  Pt was last seen in cardiology clinic on 07/11/2021 by Dr. Acie Fredrickson.  At that time Ronald Heath presented with a left bundle branch block.  Echocardiogram and Lexiscan Myoview were ordered.  Stress test showed normal perfusion, calculated ejection fraction 34%.  Echocardiogram showed moderate aortic stenosis with recommended echocardiogram in January 2024 and LVEF 40 to 45%.  The patient is now pending procedure as outlined above. Since his last visit, he has been doing pretty well.  He denies chest pain, shortness of breath, dizziness, lightheadedness.  He does pretty good with walking however sometimes his legs get weak and feel like they are going to give out.  He states he has a bad right knee and they will replace it because he is overweight.  He has lost 70 pounds but states that he probably needs to lose another 70.  He is taking Ozempic which is helping.  He is currently at [facility] with his son.  He does do his yard using a riding mower but not a Chiropractor.  He did power washing his  driveway but has to use a rolling chair to do so since his legs give out.  This is not a new problem.  Blood pressure has been running a little high lately.  When he went to see his primary care he was in the 696E systolic.  He was started on hydralazine 50 mg 3 times a day.  He does admit that it is hard to get all 3 pills in throughout the day.  He has scored a 4.64 METS on the DASI.  This exceeds the 4 METS minimum requirement.  Per  office protocol, he may hold Aspirin for 5-7 days prior to procedure. Please resume Aspirin as soon as possible postprocedure, at the discretion of the surgeon.  Home Medications    Prior to Admission medications   Medication Sig Start Date End Date Taking? Authorizing Provider  albuterol (VENTOLIN HFA) 108 (90 Base) MCG/ACT inhaler 2 Puff(s) By Mouth Every 4 Hours PRN 06/22/21   [provider]  amLODipine (NORVASC) 10 MG tablet Take 5 mg by mouth daily.     [provider]  aspirin EC 81 MG tablet Take 81 mg by mouth daily.    [provider]  atorvastatin (LIPITOR) 20 MG tablet Take 20 mg by mouth daily.    [provider]  FARXIGA 10 MG TABS tablet Take 10 mg by mouth daily. 10/21/21   [provider]  glucose blood test strip 1 each by Other route 4 (four) times daily. Use as instructed 09/06/20   Brita Romp, NP  hydrALAZINE (APRESOLINE) 50 MG tablet Take 50 mg by mouth 3 (three) times daily. 03/29/21   [provider]  insulin detemir (LEVEMIR FLEXPEN) 100 UNIT/ML FlexPen Inject 42 Units into the skin at bedtime.    [provider]  insulin lispro (HUMALOG) 100 UNIT/ML KwikPen Inject 8-14 Units into the skin 3 (three) times daily.    [provider]  levothyroxine (SYNTHROID, LEVOTHROID) 137 MCG tablet Take 137 mcg by mouth daily before breakfast.    [provider]  metFORMIN (GLUCOPHAGE-XR) 500 MG 24 hr tablet Take 1,000 mg by mouth 2 (two) times daily. 05/27/21   [provider]  olmesartan (BENICAR) 40 MG tablet Take 40 mg by mouth daily. 05/12/21   [provider]  OneTouch Delica Lancets 95M MISC 1 each by Does not apply route 4 (four) times daily. 09/06/20   Brita Romp, NP  PARoxetine (PAXIL) 40 MG tablet Take 20 mg by mouth every morning.    [provider]  pramipexole (MIRAPEX) 1 MG tablet Take 1 mg by mouth at bedtime. 11/06/18   [provider]  tirzepatide  Darcel Bayley) 5 MG/0.5ML Pen Inject 5 mg into the skin once a week. 09/08/21   Brita Romp, NP  vitamin B-12 (CYANOCOBALAMIN) 1000 MCG tablet Take 1,000 mcg by mouth daily.    [provider]    Physical Exam    Vital Signs:  Ronald Heath does not have vital signs available for review today.  168/63 when he saw his primary  Given telephonic nature of communication, physical exam is limited. AAOx3. NAD. Normal affect.  Speech and respirations are unlabored.  Accessory Clinical Findings    None  Assessment & Plan    1.  Preoperative Cardiovascular Risk Assessment:  Ronald Heath perioperative risk of a major cardiac event is 0.9% according to the Revised Cardiac Risk Index (RCRI).  Therefore, he is at low risk for perioperative complications.  His functional capacity is fair at 4.64 METs according to the [place] Activity Status Index (DASI). Recommendations: According to ACC/AHA guidelines, no further cardiovascular testing needed.  The patient may proceed to surgery at acceptable risk.   Antiplatelet and/or Anticoagulation Recommendations: Aspirin can be held for 5-7 days prior to his surgery.  Please resume Aspirin post operatively when it is felt to be safe from a bleeding standpoint.   A copy of this note will be routed to requesting surgeon.  Time:   Today, I have spent 10 minutes with the patient with telehealth technology discussing medical history, symptoms, and management plan.     Elgie Collard, PA-C  11/11/2021, 1:47 PM

## 2021-11-15 ENCOUNTER — Other Ambulatory Visit: Payer: Self-pay

## 2021-11-15 ENCOUNTER — Ambulatory Visit (INDEPENDENT_AMBULATORY_CARE_PROVIDER_SITE_OTHER): Payer: PPO | Admitting: Internal Medicine

## 2021-11-15 DIAGNOSIS — M869 Osteomyelitis, unspecified: Secondary | ICD-10-CM

## 2021-11-15 NOTE — Progress Notes (Signed)
Virtual Visit via Telephone Note  I connected with Adelfa Koh on 11/15/21 at  2:15 PM EDT by telephone and verified that I am speaking with the correct person using two identifiers.  Location: Patient: Home Provider: [facility]   I discussed the limitations, risks, security and privacy concerns of performing an evaluation and management service by telephone and the availability of in person appointments. I also discussed with the patient that there may be a patient responsible charge related to this service. The patient expressed understanding and agreed to proceed.   History of Present Illness: I called and spoke with Bill today.  He has chronic osteomyelitis of his left long finger.  After his initial visit with me a few weeks ago he did follow-up with his hand surgeon, Dr. [name], who plans to amputate the finger tomorrow.     Observations/Objective:   Assessment and Plan: Amputation of his finger should cure his chronic osteomyelitis.  Follow Up Instructions: Start Augmentin 875 mg twice daily and doxycycline 100 mg twice daily postoperatively I will arrange phone follow-up on 11/22/2021   I discussed the assessment and treatment plan with the patient. The patient was provided an opportunity to ask questions and all were answered. The patient agreed with the plan and demonstrated an understanding of the instructions.   The patient was advised to call back or seek an in-person evaluation if the symptoms worsen or if the condition fails to improve as anticipated.  I provided 15 minutes of non-face-to-face time during this encounter.   Michel Bickers, MD

## 2021-11-16 DIAGNOSIS — M868X4 Other osteomyelitis, hand: Secondary | ICD-10-CM | POA: Diagnosis not present

## 2021-11-16 DIAGNOSIS — E1142 Type 2 diabetes mellitus with diabetic polyneuropathy: Secondary | ICD-10-CM | POA: Diagnosis not present

## 2021-11-16 DIAGNOSIS — M869 Osteomyelitis, unspecified: Secondary | ICD-10-CM | POA: Diagnosis not present

## 2021-11-16 DIAGNOSIS — M86642 Other chronic osteomyelitis, left hand: Secondary | ICD-10-CM | POA: Diagnosis not present

## 2021-11-16 DIAGNOSIS — E08649 Diabetes mellitus due to underlying condition with hypoglycemia without coma: Secondary | ICD-10-CM | POA: Diagnosis not present

## 2021-11-17 DIAGNOSIS — E1165 Type 2 diabetes mellitus with hyperglycemia: Secondary | ICD-10-CM | POA: Diagnosis not present

## 2021-11-17 DIAGNOSIS — E782 Mixed hyperlipidemia: Secondary | ICD-10-CM | POA: Diagnosis not present

## 2021-11-17 DIAGNOSIS — N1831 Chronic kidney disease, stage 3a: Secondary | ICD-10-CM | POA: Diagnosis not present

## 2021-11-17 DIAGNOSIS — I129 Hypertensive chronic kidney disease with stage 1 through stage 4 chronic kidney disease, or unspecified chronic kidney disease: Secondary | ICD-10-CM | POA: Diagnosis not present

## 2021-11-22 ENCOUNTER — Ambulatory Visit (INDEPENDENT_AMBULATORY_CARE_PROVIDER_SITE_OTHER): Payer: PPO | Admitting: Internal Medicine

## 2021-11-22 ENCOUNTER — Telehealth: Payer: Self-pay

## 2021-11-22 ENCOUNTER — Other Ambulatory Visit: Payer: Self-pay

## 2021-11-22 ENCOUNTER — Encounter: Payer: Self-pay | Admitting: Internal Medicine

## 2021-11-22 DIAGNOSIS — M86642 Other chronic osteomyelitis, left hand: Secondary | ICD-10-CM

## 2021-11-22 NOTE — Telephone Encounter (Signed)
Called Surgical center to follow up on pathology results. Spoke with medical records who will fax results to triage Leatrice Jewels, RMA

## 2021-11-22 NOTE — Progress Notes (Signed)
[facility]  Patient Active Problem List   Diagnosis Date Noted   Chronic osteomyelitis of left hand [facility])     Priority: High   Morbid obesity ([place]) 05/16/2021   MSSA (methicillin susceptible Staphylococcus aureus) infection    Finger infection-Left index finger 12/30/2018   RLS (restless legs syndrome) 12/30/2018   Hypothyroidism    Primary hypertension    Mixed hyperlipidemia    Diabetes mellitus without complication (HCC)    Depression    AKI (acute kidney injury) ([place])    Lactic acidosis    OSA on CPAP     Patient's Medications  New Prescriptions   No medications on file  Previous Medications   ALBUTEROL (VENTOLIN HFA) 108 (90 BASE) MCG/ACT INHALER    2 Puff(s) By Mouth Every 4 Hours PRN   AMLODIPINE (NORVASC) 10 MG TABLET    Take 5 mg by mouth daily.    AMOXICILLIN-CLAVULANATE (AUGMENTIN) 875-125 MG TABLET    Take 1 tablet by mouth 2 (two) times daily.   ASPIRIN EC 81 MG TABLET    Take 81 mg by mouth daily.   ATORVASTATIN (LIPITOR) 20 MG TABLET    Take 20 mg by mouth daily.   DOXYCYCLINE (VIBRA-TABS) 100 MG TABLET    Take 100 mg by mouth 2 (two) times daily.   FARXIGA 10 MG TABS TABLET    Take 10 mg by mouth daily.   GLUCOSE BLOOD TEST STRIP    1 each by Other route 4 (four) times daily. Use as instructed   HYDRALAZINE (APRESOLINE) 50 MG TABLET    Take 50 mg by mouth 3 (three) times daily.   INSULIN DETEMIR (LEVEMIR FLEXPEN) 100 UNIT/ML FLEXPEN    Inject 42 Units into the skin at bedtime.   INSULIN LISPRO (HUMALOG) 100 UNIT/ML KWIKPEN    Inject 8-14 Units into the skin 3 (three) times daily.   LEVOTHYROXINE (SYNTHROID, LEVOTHROID) 137 MCG TABLET    Take 137 mcg by mouth daily before breakfast.   METFORMIN (GLUCOPHAGE-XR) 500 MG 24 HR TABLET    Take 1,000 mg by mouth 2 (two) times daily.   OLMESARTAN (BENICAR) 40 MG TABLET    Take 40 mg by mouth daily.   ONETOUCH DELICA LANCETS 78G MISC    1 each by Does not apply route 4 (four) times  daily.   PAROXETINE (PAXIL) 40 MG TABLET    Take 20 mg by mouth every morning.   PRAMIPEXOLE (MIRAPEX) 1 MG TABLET    Take 1 mg by mouth at bedtime.   TIRZEPATIDE (MOUNJARO) 5 MG/0.5ML PEN    Inject 5 mg into the skin once a week.   VITAMIN B-12 (CYANOCOBALAMIN) 1000 MCG TABLET    Take 1,000 mcg by mouth daily.  Modified Medications   No medications on file  Discontinued Medications   No medications on file    Subjective: Ronald Heath is in for his routine follow-up visit.  He had chronic osteomyelitis of his left long finger.  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  After he saw me a few weeks ago he followed up with his hand surgeon, Dr. [name].  X-rays done there showed considerable deterioration and loss of bone compared with his last x-ray.  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  He was placed on empiric amoxicillin/clavulanate and doxycycline postoperatively.  He feels like he is doing very well.  He is not having  any problems tolerating his antibiotics.  We have called in Dr. Biagio Borg office and the surgical center but have not been able to receive results of operative Gram stain and cultures yet.  Review of Systems: Review of Systems  Constitutional:  Negative for chills, diaphoresis and fever.  Gastrointestinal:  Negative for diarrhea, nausea and vomiting.    Past Medical History:  Diagnosis Date   Depression    Diabetes mellitus without complication ([place])    Finger infection 12/31/2018   Hyperlipidemia    Hypertension    Hypothyroidism    Shortness of breath    with exertion   Sleep apnea    pt is supposed to wear CPAP, but doesn't wear it    Social History   Tobacco Use   Smoking status: Former    Packs/day: 2.00    Years: 15.00    Total pack years: 30.00    Types: Cigarettes   Smokeless tobacco: Never  Vaping Use   Vaping Use: Never used  Substance Use Topics   Alcohol use: Yes    Alcohol/week: 2.0 standard  drinks of alcohol    Types: 2 Cans of beer per week   Drug use: No    Family History  Problem Relation Age of Onset   Diabetes Mellitus II Father    Stroke Father     No Known Allergies  Objective: Vitals:   11/22/21 1408  BP: (!) 177/63  Pulse: 80  Temp: 98.2 F (36.8 C)  TempSrc: Oral  SpO2: 95%  Weight: (!) 369 lb 3.2 oz (167.5 kg)   Body mass index is 48.71 kg/m.  Physical Exam Constitutional:      Comments: He is in good spirits.  Musculoskeletal:     Comments: His left index and long fingers are surgically absent.  His surgical incision seems to be healing well.  There is only 1 small area that is still open.  There is no drainage, malodor, redness or unusual swelling.      Lab Results    Problem List Items Addressed This Visit       High   Chronic osteomyelitis of left hand ([place])    I suspect that his recent amputation was curative for his chronic osteomyelitis.  He will complete 2 more days of his antibiotics.  I will arrange follow-up in 2 weeks.        Michel Bickers, MD [facility] [phone] pager   [phone] cell 11/22/2021, 2:24 PM

## 2021-11-22 NOTE — Assessment & Plan Note (Signed)
I suspect that his recent amputation was curative for his chronic osteomyelitis.  He will complete 2 more days of his antibiotics.  I will arrange follow-up in 2 weeks.

## 2021-11-22 NOTE — Telephone Encounter (Signed)
Dr. Megan Salon requesting Op culture report from 8/30. Staff called [facility] to obtain records. Call transferred to [facility] in medical records advised triage staff  will need to contact [facility] for requested report.   Triage contacted [facility] and requested pathology report. Currently awaiting results to be faxed to triage [facility]

## 2021-12-06 ENCOUNTER — Ambulatory Visit: Payer: PPO | Admitting: Internal Medicine

## 2021-12-13 ENCOUNTER — Encounter: Payer: Self-pay | Admitting: Nurse Practitioner

## 2021-12-13 ENCOUNTER — Ambulatory Visit: Payer: PPO | Admitting: Nurse Practitioner

## 2021-12-13 VITALS — Ht 73.0 in | Wt 371.8 lb

## 2021-12-13 DIAGNOSIS — E039 Hypothyroidism, unspecified: Secondary | ICD-10-CM | POA: Diagnosis not present

## 2021-12-13 DIAGNOSIS — I10 Essential (primary) hypertension: Secondary | ICD-10-CM

## 2021-12-13 DIAGNOSIS — E119 Type 2 diabetes mellitus without complications: Secondary | ICD-10-CM

## 2021-12-13 DIAGNOSIS — E782 Mixed hyperlipidemia: Secondary | ICD-10-CM | POA: Diagnosis not present

## 2021-12-13 LAB — POCT GLYCOSYLATED HEMOGLOBIN (HGB A1C): Hemoglobin A1C: 7 % — AB (ref 4.0–5.6)

## 2021-12-13 NOTE — Progress Notes (Signed)
Endocrinology Follow Up Note       12/13/2021, 1:36 PM   Subjective:    Patient ID: Ronald Heath, male    DOB: 27-Jun-1945.  Ronald Heath is being seen in follow up after being seen in consultation for management of currently uncontrolled symptomatic diabetes requested by  Celene Squibb, MD.  He is transferring care from his previous Endocrinologist Dr. Elyse Hsu who is retiring.   Past Medical History:  Diagnosis Date   Depression    Diabetes mellitus without complication ([place])    Finger infection 12/31/2018   Hyperlipidemia    Hypertension    Hypothyroidism    Shortness of breath    with exertion   Sleep apnea    pt is supposed to wear CPAP, but doesn't wear it    Past Surgical History:  Procedure Laterality Date   AMPUTATION Left 01/01/2019   Procedure: AMPUTATION LEFT INDEX FINGER;  Surgeon: [name], MD;  Location: [place];  Service: Orthopedics;  Laterality: Left;   CATARACT EXTRACTION W/PHACO Right 11/21/2012   Procedure: CATARACT EXTRACTION PHACO AND INTRAOCULAR LENS PLACEMENT (IOC);  Surgeon: Tonny Branch, MD;  Location: [facility];  Service: Ophthalmology;  Laterality: Right;  CDE:19.67   CATARACT EXTRACTION W/PHACO Left 12/19/2012   Procedure: CATARACT EXTRACTION PHACO AND INTRAOCULAR LENS PLACEMENT (IOC);  Surgeon: Tonny Branch, MD;  Location: [facility];  Service: Ophthalmology;  Laterality: Left;  CDE:8.00   KNEE ARTHROSCOPY Left    ROTATOR CUFF REPAIR Right     Social History   Socioeconomic History   Marital status: Widowed    Spouse name: Not on file   Number of children: Not on file   Years of education: Not on file   Highest education level: Not on file  Occupational History   Not on file  Tobacco Use   Smoking status: Former    Packs/day: 2.00    Years: 15.00    Total pack years: 30.00    Types: Cigarettes   Smokeless tobacco: Never  Vaping Use   Vaping Use: Never used   Substance and Sexual Activity   Alcohol use: Yes    Alcohol/week: 2.0 standard drinks of alcohol    Types: 2 Cans of beer per week   Drug use: No   Sexual activity: Not on file  Other Topics Concern   Not on file  Social History Narrative   Not on file   Social Determinants of Health   Financial Resource Strain: Not on file  Food Insecurity: Not on file  Transportation Needs: Not on file  Physical Activity: Not on file  Stress: Not on file  Social Connections: Not on file    Family History  Problem Relation Age of Onset   Diabetes Mellitus II Father    Stroke Father     Outpatient Encounter Medications as of 12/13/2021  Medication Sig   albuterol (VENTOLIN HFA) 108 (90 Base) MCG/ACT inhaler 2 Puff(s) By Mouth Every 4 Hours PRN   amLODipine (NORVASC) 10 MG tablet Take 5 mg by mouth daily.    aspirin EC 81 MG tablet Take 81 mg by mouth daily.   atorvastatin (LIPITOR) 20 MG tablet Take 20  mg by mouth daily.   azelastine (ASTELIN) 0.1 % nasal spray Place 2 sprays into both nostrils 2 (two) times daily.   glucose blood test strip 1 each by Other route 4 (four) times daily. Use as instructed   hydrALAZINE (APRESOLINE) 50 MG tablet Take 50 mg by mouth 3 (three) times daily.   insulin detemir (LEVEMIR FLEXPEN) 100 UNIT/ML FlexPen Inject 42 Units into the skin at bedtime.   insulin lispro (HUMALOG) 100 UNIT/ML KwikPen Inject 8-14 Units into the skin 3 (three) times daily.   levothyroxine (SYNTHROID, LEVOTHROID) 137 MCG tablet Take 137 mcg by mouth daily before breakfast.   metFORMIN (GLUCOPHAGE-XR) 500 MG 24 hr tablet Take 1,000 mg by mouth 2 (two) times daily.   olmesartan (BENICAR) 40 MG tablet Take 40 mg by mouth daily.   OneTouch Delica Lancets 83T MISC 1 each by Does not apply route 4 (four) times daily.   PARoxetine (PAXIL) 40 MG tablet Take 20 mg by mouth every morning.   pramipexole (MIRAPEX) 1 MG tablet Take 1 mg by mouth at bedtime.   Semaglutide (OZEMPIC, 1 MG/DOSE, [place])  Inject 1 mg into the skin once a week.   vitamin B-12 (CYANOCOBALAMIN) 1000 MCG tablet Take 1,000 mcg by mouth daily.   [DISCONTINUED] FARXIGA 10 MG TABS tablet Take 10 mg by mouth daily.   oxyCODONE (OXY IR/ROXICODONE) 5 MG immediate release tablet Take 5 mg by mouth every 6 (six) hours as needed. (Patient not taking: Reported on 12/13/2021)   [DISCONTINUED] tirzepatide [facility]) 5 MG/0.5ML Pen Inject 5 mg into the skin once a week. (Patient not taking: Reported on 12/13/2021)   No facility-administered encounter medications on file as of 12/13/2021.    ALLERGIES: No Known Allergies  VACCINATION STATUS: Immunization History  Administered Date(s) Administered   Influenza, High Dose Seasonal PF 01/16/2018   Influenza-Unspecified 12/19/2019   Moderna Sars-Covid-2 Vaccination 05/31/2019, 06/21/2019, 02/19/2020   Tdap 12/30/2018    Diabetes He presents for his follow-up diabetic visit. He has type 2 diabetes mellitus. Onset time: Diagnosed at approx age of 7. His disease course has been improving. Hypoglycemia symptoms include sweats. Pertinent negatives for hypoglycemia include no nervousness/anxiousness or tremors. Associated symptoms include foot paresthesias, polydipsia, polyuria and weight loss. Pertinent negatives for diabetes include no fatigue. There are no hypoglycemic complications. Symptoms are stable. Diabetic complications include nephropathy, peripheral neuropathy and retinopathy. Risk factors for coronary artery disease include diabetes mellitus, dyslipidemia, family history, hypertension, male sex, obesity and sedentary lifestyle. Current diabetic treatment includes intensive insulin program and oral agent (monotherapy) (and Ozempic). He is compliant with treatment most of the time. His weight is fluctuating minimally. He is following a generally healthy diet. When asked about meal planning, he reported none. He has not had a previous visit with a dietitian. He never participates in  exercise. His home blood glucose trend is decreasing steadily. His overall blood glucose range is 110-130 mg/dl. (He presents today with his meter, no logs, showing he is only checking glucose once daily before breakfast.  His POCT A1c today is 7%, improving from last visit of 7.6%.  He did see his PCP between visits and was changed back from [facility] to Ozempic due to nausea (he is getting patient assistance for this medication).  He was also started on Farxiga as well.  He admits he only takes his Humalog (before bed) if his morning reading is higher than normal.  He does have some fasting hypoglycemia noted (sometimes as low as 50). ) An  ACE inhibitor/angiotensin II receptor blocker is being taken. He sees a podiatrist.Eye exam is current.  Hyperlipidemia This is a chronic problem. The current episode started more than 1 year ago. The problem is controlled. Recent lipid tests were reviewed and are normal. Exacerbating diseases include chronic renal disease, diabetes, hypothyroidism and obesity. Factors aggravating his hyperlipidemia include fatty foods and thiazides. Current antihyperlipidemic treatment includes statins. The current treatment provides significant improvement of lipids. Compliance problems include adherence to diet and adherence to exercise.  Risk factors for coronary artery disease include diabetes mellitus, dyslipidemia, hypertension, male sex, obesity and a sedentary lifestyle.  Hypertension This is a chronic problem. The current episode started more than 1 year ago. The problem has been gradually improving since onset. The problem is controlled. Associated symptoms include peripheral edema and sweats. Pertinent negatives include no palpitations. Agents associated with hypertension include thyroid hormones. Risk factors for coronary artery disease include diabetes mellitus, dyslipidemia, family history, obesity, male gender and sedentary lifestyle. Past treatments include calcium channel  blockers, diuretics and angiotensin blockers. The current treatment provides mild improvement. Compliance problems include diet and exercise.  Hypertensive end-organ damage includes kidney disease and retinopathy. Identifiable causes of hypertension include chronic renal disease and a thyroid problem.  Thyroid Problem Presents for follow-up visit. Symptoms include weight loss. Patient reports no anxiety, cold intolerance, constipation, depressed mood, fatigue, heat intolerance, leg swelling, palpitations, tremors or weight gain. The symptoms have been stable. His past medical history is significant for diabetes and hyperlipidemia.     Review of systems  Constitutional: + steadily decreasing body weight, current Body mass index is 49.05 kg/m.,  no subjective hyperthermia, no subjective hypothermia Eyes: no blurry vision, no xerophthalmia ENT: no sore throat, no nodules palpated in throat, no dysphagia/odynophagia, no hoarseness Cardiovascular: no chest pain, no shortness of breath, no palpitations, + ankle swelling Respiratory: no cough, no shortness of breath Gastrointestinal: no nausea/vomiting/diarrhea Musculoskeletal: right knee pain (needs TKR) Skin: no rashes, no hyperemia Neurological: no tremors, no numbness, no tingling, no dizziness Psychiatric: no depression, no anxiety  Objective:     Ht '6\' 1"'$  (1.854 m)   Wt (!) 371 lb 12.8 oz (168.6 kg)   BMI 49.05 kg/m   Wt Readings from Last 3 Encounters:  12/13/21 (!) 371 lb 12.8 oz (168.6 kg)  11/22/21 (!) 369 lb 3.2 oz (167.5 kg)  10/27/21 (!) 370 lb (167.8 kg)     BP Readings from Last 3 Encounters:  11/22/21 (!) 177/63  10/27/21 (!) 168/63  09/08/21 (!) 165/82      Physical Exam- Limited  Constitutional:  Body mass index is 49.05 kg/m. , not in acute distress, normal state of mind Eyes:  EOMI, no exophthalmos Neck: Supple Cardiovascular: RRR, no murmurs, rubs, or gallops, no edema Respiratory: Adequate breathing  efforts, no crackles, rales, rhonchi, or wheezing Musculoskeletal: no gross deformities, strength intact in all four extremities, no gross restriction of joint movements Skin:  no rashes, no hyperemia Neurological: no tremor with outstretched hands   Diabetic Foot Exam - Simple   No data filed      CMP ( most recent) CMP     Component Value Date/Time   NA 138 08/11/2021 0825   K 5.1 08/11/2021 0825   CL 104 08/11/2021 0825   CO2 23 08/11/2021 0825   GLUCOSE 152 (H) 08/11/2021 0825   GLUCOSE 120 (H) 11/03/2019 1035   BUN 21 08/11/2021 0825   CREATININE 1.08 08/11/2021 0825   CREATININE 1.19 (H) 11/03/2019  1035   CALCIUM 8.8 08/11/2021 0825   PROT 6.3 08/11/2021 0825   ALBUMIN 3.7 08/11/2021 0825   AST 18 08/11/2021 0825   ALT 18 08/11/2021 0825   ALKPHOS 75 08/11/2021 0825   BILITOT 0.3 08/11/2021 0825   GFRNONAA 73 07/27/2020 0000   GFRNONAA 60 11/03/2019 1035   GFRAA 70 11/03/2019 1035     Diabetic Labs (most recent): Lab Results  Component Value Date   HGBA1C 7.0 (A) 12/13/2021   HGBA1C 7.6 09/08/2021   HGBA1C 6.9 05/16/2021     Lipid Panel ( most recent) Lipid Panel     Component Value Date/Time   CHOL 91 (L) 08/11/2021 0825   TRIG 59 08/11/2021 0825   HDL 41 08/11/2021 0825   CHOLHDL 2.2 08/11/2021 0825   LDLCALC 36 08/11/2021 0825   LABVLDL 14 08/11/2021 0825      Lab Results  Component Value Date   TSH 1.340 08/11/2021   TSH 1.440 01/03/2021   FREET4 1.46 08/11/2021   FREET4 1.27 01/03/2021           Assessment & Plan:   1) Diabetes mellitus without complication ([place])  He presents today with his meter, no logs, showing he is only checking glucose once daily before breakfast.  His POCT A1c today is 7%, improving from last visit of 7.6%.  He did see his PCP between visits and was changed back from [facility] to Ozempic due to nausea (he is getting patient assistance for this medication).  He was also started on Farxiga as well.  He admits  he only takes his Humalog (before bed) if his morning reading is higher than normal.  He does have some fasting hypoglycemia noted (sometimes as low as 50).  - Ronald Heath has currently uncontrolled symptomatic type 2 DM since 76 years of age.  -Recent labs reviewed.  - I had a long discussion with him about the progressive nature of diabetes and the pathology behind its complications. 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 These are all discussed in detail with him.  - Nutritional counseling repeated at each appointment due to patients tendency to fall back in to old habits.  - The patient admits there is a room for improvement in their diet and drink choices. -  Suggestion is made for the patient to avoid simple carbohydrates from their diet including Cakes, Sweet Desserts / Pastries, Ice Cream, Soda (diet and regular), Sweet Tea, Candies, Chips, Cookies, Sweet Pastries, Store Bought Juices, Alcohol in Excess of 1-2 drinks a day, Artificial Sweeteners, Coffee Creamer, and "Sugar-free" Products. This will help patient to have stable blood glucose profile and potentially avoid unintended weight gain.   - I encouraged the patient to switch to unprocessed or minimally processed complex starch and increased protein intake (animal or plant source), fruits, and vegetables.   - Patient is advised to stick to a routine mealtimes to eat 3 meals a day and avoid unnecessary snacks (to snack only to correct hypoglycemia).  The following Lifestyle Medicine recommendations according to [place]  [facility]) were discussed and and offered to patient and he  agrees to start the journey:  A. Whole Foods, Plant-Based Nutrition comprising of fruits and vegetables, plant-based proteins, whole-grain carbohydrates was discussed in detail  with the patient.   A list for source of those nutrients  were also provided to the patient.  Patient will use only water or unsweetened tea for hydration. B.  The need to stay away from risky substances including alcohol, smoking; obtaining 7 to 9 hours of restorative sleep, at least 150 minutes of moderate intensity exercise weekly, the importance of healthy social connections,  and stress management techniques were discussed. C.  A full color page of  Calorie density of various food groups per pound showing examples of each food groups was provided to the patient.  - I have approached him with the following individualized plan to manage his diabetes and patient agrees:   -He is advised to lower his Lantus to 45 units SQ nightly and not to take his Humalog before bed (to avoid hypoglycemia).  He can continue Metformin 1000 mg po twice daily with meals and continue Ozempic 1 mg SQ weekly.  He is encouraged to take his Humalog per SSI 10-16 units TID with meals if glucose is above 90 and he is eating which will require him to monitor his glucose more frequently.  -he is encouraged to continue monitoring glucose 4 times daily, before meals and before bed, and to call the clinic if he has readings less than 70 or greater than 200 for 3 tests in a row.  He could greatly benefit from CGM device, especially since he has random hypoglycemia.  He received his CGM from [employer], but has not started wearing it.  I encouraged him to bring it here for assistance in applying.  - he is warned not to take insulin without proper monitoring per orders.  - Adjustment parameters are given to him for hypo and hyperglycemia in writing.  - Specific targets for  A1c; LDL, HDL, and Triglycerides were discussed with the patient.  2) Blood Pressure /Hypertension: -His blood pressure is not controlled to target but is stable.  he is advised to continue his current medications including Norvasc 10 mg p.o. daily with breakfast,  Hydralazine 50 mg po TID, and Olmesartan 40 mg po daily.   3) Lipids/Hyperlipidemia:    Review of his recent lipid panel from 08/11/21 showed controlled LDL at 36.  He is advised to continue Lipitor 20 mg p.o. daily at bedtime.  Side effects and precautions discussed with him.  4)  Weight/Diet:  his Body mass index is 49.05 kg/m.  -  clearly complicating his diabetes care.  he is a candidate for weight loss. I discussed with him the fact that loss of 5 - 10% of his  current body weight will have the most impact on his diabetes management.  Exercise, and detailed carbohydrates information provided  -  detailed on discharge instructions.    5) Hypothyroidism- unspecified There are no recent TFTs to review.  He is advised to continue Levothyroxine 137 mcg p.o. daily before breakfast.    - The correct intake of thyroid hormone (Levothyroxine, Synthroid), is on empty stomach first thing in the morning, with water, separated by at least 30 minutes from breakfast and other medications,  and separated by more than 4 hours from calcium, iron, multivitamins, acid reflux medications (PPIs).  - This medication is a life-long medication and will be needed to correct thyroid hormone imbalances for the rest of your life.  The dose may change from time to time, based on thyroid blood work.  - It is extremely important to be consistent taking this medication, near the same time each morning.  -AVOID TAKING PRODUCTS CONTAINING BIOTIN (commonly found in Hair,  Skin, Nails vitamins) AS IT INTERFERES WITH THE VALIDITY OF THYROID FUNCTION BLOOD TESTS.  6) Chronic Care/Health Maintenance: -he is on ACEI/ARB and Statin medications and is encouraged to initiate and continue to follow up with Ophthalmology, Dentist, Podiatrist at least yearly or according to recommendations, and advised to stay away from smoking. I have recommended yearly flu vaccine and pneumonia vaccine at least every 5 years; moderate intensity exercise  for up to 150 minutes weekly; and sleep for at least 7 hours a day.  - he is advised to maintain close follow up with Celene Squibb, MD for primary care needs, as well as his other providers for optimal and coordinated care.     I spent 45 minutes in the care of the patient today including review of labs from [facility], Lipids, Thyroid Function, Hematology (current and previous including abstractions from other facilities); face-to-face time discussing  his blood glucose readings/logs, discussing hypoglycemia and hyperglycemia episodes and symptoms, medications doses, his options of short and long term treatment based on the latest standards of care / guidelines;  discussion about incorporating lifestyle medicine;  and documenting the encounter. Risk reduction counseling performed per USPSTF guidelines to reduce obesity and cardiovascular risk factors.     Please refer to Patient Instructions for Blood Glucose Monitoring and Insulin/Medications Dosing Guide"  in media tab for additional information. Please  also refer to " Patient Self Inventory" in the Media  tab for reviewed elements of pertinent patient history.  Ronald Heath participated in the discussions, expressed understanding, and voiced agreement with the above plans.  All questions were answered to his satisfaction. he is encouraged to contact clinic should he have any questions or concerns prior to his return visit.   Follow up plan: - Return in about 4 months (around 04/14/2022) for Diabetes F/U with A1c in office, Thyroid follow up, Previsit labs, Bring meter and logs.   Rayetta Pigg, [facility] [address] [place], [place] [address] Phone: [phone] Fax: [phone]  12/13/2021, 1:36 PM

## 2021-12-13 NOTE — Patient Instructions (Signed)

## 2021-12-15 DIAGNOSIS — B351 Tinea unguium: Secondary | ICD-10-CM | POA: Diagnosis not present

## 2021-12-15 DIAGNOSIS — E1142 Type 2 diabetes mellitus with diabetic polyneuropathy: Secondary | ICD-10-CM | POA: Diagnosis not present

## 2021-12-17 DIAGNOSIS — E08649 Diabetes mellitus due to underlying condition with hypoglycemia without coma: Secondary | ICD-10-CM | POA: Diagnosis not present

## 2021-12-17 DIAGNOSIS — E1142 Type 2 diabetes mellitus with diabetic polyneuropathy: Secondary | ICD-10-CM | POA: Diagnosis not present

## 2022-01-17 DIAGNOSIS — I447 Left bundle-branch block, unspecified: Secondary | ICD-10-CM | POA: Insufficient documentation

## 2022-01-17 DIAGNOSIS — I493 Ventricular premature depolarization: Secondary | ICD-10-CM | POA: Insufficient documentation

## 2022-01-17 DIAGNOSIS — I420 Dilated cardiomyopathy: Secondary | ICD-10-CM | POA: Insufficient documentation

## 2022-01-17 DIAGNOSIS — I350 Nonrheumatic aortic (valve) stenosis: Secondary | ICD-10-CM | POA: Insufficient documentation

## 2022-01-17 NOTE — Progress Notes (Deleted)
Cardiology Office Note:    Date:  01/17/2022   ID:  Ronald Heath, DOB Mar 07, 1946, MRN [id number]  PCP:  Celene Squibb, MD  [place] Providers Cardiologist:  None { Click to update primary MD,subspecialty MD or APP then REFRESH:1}  *** Referring MD: Celene Squibb, MD   Chief Complaint:  No chief complaint on file. {Click here for Visit Info    :1}   Patient Profile: Left Bundle Branch Block Remote cardiac catheterization reportedly normal  Cardiomyopathy  Myoview 07/2021: no ischemia or infarct; EF 34, intermediate risk  Echocardiogram 08/01/21: EF 40-45, mild to mod AS (mean 12, DI 0.33), no RWMA, mod LVH, Gr 1 DD, normal RVSF, severe LAE, trivial MR, aortic root 42 mm (mildly dilated) Mild to mod aortic stenosis  PVCs Diabetes mellitus  Hypertension  Hyperlipidemia  Carotid artery disease [place] 06/2021: Bilat ICA 1-39  Hypothyroidism OSA Chronic osteomyelitis of L hand S/p amputation of index and long finger    History of Present Illness:   Ronald Heath is a 76 y.o. male with the above problem list.  He ***         Past Medical History:  Diagnosis Date   Depression    Diabetes mellitus without complication ([place])    Finger infection 12/31/2018   Hyperlipidemia    Hypertension    Hypothyroidism    Shortness of breath    with exertion   Sleep apnea    pt is supposed to wear CPAP, but doesn't wear it   Current Medications: No outpatient medications have been marked as taking for the 01/18/22 encounter (Appointment) with Richardson Dopp T, PA-C.    Allergies:   Patient has no known allergies.   Social History   Tobacco Use   Smoking status: Former    Packs/day: 2.00    Years: 15.00    Total pack years: 30.00    Types: Cigarettes   Smokeless tobacco: Never  Vaping Use   Vaping Use: Never used  Substance Use Topics   Alcohol use: Yes    Alcohol/week: 2.0 standard drinks of alcohol    Types: 2 Cans of beer per week   Drug use: No     Family Hx: The patient's family history includes Diabetes Mellitus II in his father; Stroke in his father.  ROS   EKGs/Labs/Other Test Reviewed:    EKG:  EKG is *** ordered today.  The ekg ordered today demonstrates ***   Recent Labs: 08/11/2021: ALT 18; BUN 21; Creatinine, Ser 1.08; Potassium 5.1; Sodium 138; TSH 1.340   Recent Lipid Panel Recent Labs    08/11/21 0825  CHOL 91*  TRIG 59  HDL 41  LDLCALC 36     Cardiac Studies & Procedures     STRESS TESTS  MYOCARDIAL PERFUSION IMAGING 08/02/2021  Narrative   Findings are consistent with no prior ischemia and no prior myocardial infarction. The study is intermediate risk.   No ST deviation was noted.   LV perfusion is normal. There is no evidence of ischemia. There is no evidence of infarction.   Left ventricular function is abnormal. Global function is moderately reduced. Nuclear stress EF: 34 %. The left ventricular ejection fraction is moderately decreased (30-44%). End diastolic cavity size is severely enlarged. End systolic cavity size is severely enlarged.   Prior study not available for comparison.   ECHOCARDIOGRAM  ECHOCARDIOGRAM COMPLETE 08/01/2021  Narrative ECHOCARDIOGRAM REPORT    Patient Name:   Ronald Heath Date  of Exam: 08/01/2021 Medical Rec #:  [id number]          Height:       73.0 in Accession #:    [id number]         Weight:       389.8 lb Date of Birth:  08-11-1945           BSA:          2.857 m Patient Age:    78 years           BP:           162/86 mmHg Patient Gender: M                  HR:           94 bpm. Exam Location:  [address]  Procedure: 2D Echo, Cardiac Doppler, Color Doppler and Intracardiac Opacification Agent  Indications:    R01.1 Murmur  History:        Patient has no prior history of Echocardiogram examinations. Arrythmias:LBBB, Signs/Symptoms:Murmur; Risk Factors:Morbid obesity, Diabetes, Former Smoker, Hypertension and Dyslipidemia.  Sonographer:    Coralyn Helling RDCS Referring Phys: [place]   Sonographer Comments: Technically difficult study due to poor echo windows and patient is morbidly obese. Image acquisition challenging due to patient body habitus. IMPRESSIONS   1. Aortic valve not well visualized. Vmax 2.3 m/s, MG 12 mmHG, AVA 1.26 cm2, DI 0.33. LVEF is reduced so suspect Vmax and gradients are reduced. Based on AVA and DI suspect mild to moderate AS. The aortic valve is calcified. Aortic valve regurgitation is not visualized. Mild to moderate aortic valve stenosis. 2. Left ventricular ejection fraction, by estimation, is 40 to 45%. The left ventricle has mildly decreased function. The left ventricle has no regional wall motion abnormalities. There is moderate concentric left ventricular hypertrophy. Left ventricular diastolic parameters are consistent with Grade I diastolic dysfunction (impaired relaxation). 3. Right ventricular systolic function is normal. The right ventricular size is normal. Tricuspid regurgitation signal is inadequate for assessing PA pressure. 4. Left atrial size was severely dilated. 5. The mitral valve is grossly normal. Trivial mitral valve regurgitation. No evidence of mitral stenosis. 6. Aortic dilatation noted. There is mild dilatation of the aortic root, measuring 42 mm.  FINDINGS Left Ventricle: Left ventricular ejection fraction, by estimation, is 40 to 45%. The left ventricle has mildly decreased function. The left ventricle has no regional wall motion abnormalities. Definity contrast agent was given IV to delineate the left ventricular endocardial borders. The left ventricular internal cavity size was normal in size. There is moderate concentric left ventricular hypertrophy. Abnormal (paradoxical) septal motion, consistent with left bundle branch block. Left ventricular diastolic parameters are consistent with Grade I diastolic dysfunction (impaired relaxation).  Right Ventricle: The right  ventricular size is normal. No increase in right ventricular wall thickness. Right ventricular systolic function is normal. Tricuspid regurgitation signal is inadequate for assessing PA pressure.  Left Atrium: Left atrial size was severely dilated.  Right Atrium: Right atrial size was normal in size.  Pericardium: Trivial pericardial effusion is present.  Mitral Valve: The mitral valve is grossly normal. Trivial mitral valve regurgitation. No evidence of mitral valve stenosis.  Tricuspid Valve: The tricuspid valve is grossly normal. Tricuspid valve regurgitation is trivial. No evidence of tricuspid stenosis.  Aortic Valve: Aortic valve not well visualized. Vmax 2.3 m/s, MG 12 mmHG, AVA 1.26 cm2, DI 0.33. LVEF is reduced so suspect Vmax and  gradients are reduced. Based on AVA and DI suspect mild to moderate AS. The aortic valve is calcified. Aortic valve regurgitation is not visualized. Mild to moderate aortic stenosis is present. Aortic valve mean gradient measures 11.5 mmHg. Aortic valve peak gradient measures 20.8 mmHg. Aortic valve area, by VTI measures 1.26 cm.  Pulmonic Valve: The pulmonic valve was grossly normal. Pulmonic valve regurgitation is not visualized. No evidence of pulmonic stenosis.  Aorta: Aortic dilatation noted. There is mild dilatation of the aortic root, measuring 42 mm.  Venous: The inferior vena cava was not well visualized.  IAS/Shunts: The atrial septum is grossly normal.   LEFT VENTRICLE PLAX 2D LVIDd:         6.00 cm   Diastology LVIDs:         4.00 cm   LV e' medial:    8.49 cm/s LV PW:         1.30 cm   LV E/e' medial:  11.7 LV IVS:        1.60 cm   LV e' lateral:   6.53 cm/s LVOT diam:     2.20 cm   LV E/e' lateral: 15.3 LV SV:         65 LV SV Index:   23 LVOT Area:     3.80 cm   LEFT ATRIUM            Index        RIGHT ATRIUM           Index LA diam:      4.90 cm  1.71 cm/m   RA Pressure: 3.00 mmHg LA Vol (A4C): 149.0 ml 52.15 ml/m  RA  Area:     17.00 cm RA Volume:   46.90 ml  16.41 ml/m AORTIC VALVE AV Area (Vmax):    1.44 cm AV Area (Vmean):   1.48 cm AV Area (VTI):     1.26 cm AV Vmax:           228.00 cm/s AV Vmean:          160.500 cm/s AV VTI:            0.514 m AV Peak Grad:      20.8 mmHg AV Mean Grad:      11.5 mmHg LVOT Vmax:         86.20 cm/s LVOT Vmean:        62.400 cm/s LVOT VTI:          0.170 m LVOT/AV VTI ratio: 0.33  AORTA Ao Root diam: 4.20 cm Ao Asc diam:  3.70 cm  MITRAL VALVE                TRICUSPID VALVE MV Area (PHT): 2.69 cm     Estimated RAP:  3.00 mmHg MV Decel Time: 282 msec MV E velocity: 99.60 cm/s   SHUNTS MV A velocity: 141.00 cm/s  Systemic VTI:  0.17 m MV E/A ratio:  0.71         Systemic Diam: 2.20 cm  Eleonore Chiquito MD Electronically signed by Eleonore Chiquito MD Signature Date/Time: 08/01/2021/12:40:33 PM    Final              Risk Assessment/Calculations/Metrics:   {Does this patient have ATRIAL FIBRILLATION?:[phone]}     No BP recorded.  {Refresh Note OR Click here to enter BP  :1}***    Physical Exam:    VS:  There were no vitals taken for this visit.  Wt Readings from Last 3 Encounters:  12/13/21 (!) 371 lb 12.8 oz (168.6 kg)  11/22/21 (!) 369 lb 3.2 oz (167.5 kg)  10/27/21 (!) 370 lb (167.8 kg)    Physical Exam ***     ASSESSMENT & PLAN:   No problem-specific Assessment & Plan notes found for this encounter.        {Are you ordering a CV Procedure (e.g. stress test, cath, DCCV, TEE, etc)?   Press F2        :[id number]}   Dispo:  No follow-ups on file.   Medication Adjustments/Labs and Tests Ordered: Current medicines are reviewed at length with the patient today.  Concerns regarding medicines are outlined above.  Tests Ordered: No orders of the defined types were placed in this encounter.  Medication Changes: No orders of the defined types were placed in this encounter.  Signed, Richardson Dopp, PA-C  01/17/2022 10:59 PM    [address], [place], [place]  [address] Phone: [phone]; Fax: [phone]

## 2022-01-18 ENCOUNTER — Ambulatory Visit: Payer: PPO | Admitting: Physician Assistant

## 2022-01-18 DIAGNOSIS — I350 Nonrheumatic aortic (valve) stenosis: Secondary | ICD-10-CM

## 2022-01-18 DIAGNOSIS — I447 Left bundle-branch block, unspecified: Secondary | ICD-10-CM

## 2022-01-18 DIAGNOSIS — I420 Dilated cardiomyopathy: Secondary | ICD-10-CM

## 2022-01-18 DIAGNOSIS — I493 Ventricular premature depolarization: Secondary | ICD-10-CM

## 2022-02-23 DIAGNOSIS — Z23 Encounter for immunization: Secondary | ICD-10-CM | POA: Diagnosis not present

## 2022-02-23 DIAGNOSIS — D519 Vitamin B12 deficiency anemia, unspecified: Secondary | ICD-10-CM | POA: Diagnosis not present

## 2022-02-23 DIAGNOSIS — E785 Hyperlipidemia, unspecified: Secondary | ICD-10-CM | POA: Diagnosis not present

## 2022-02-23 DIAGNOSIS — I10 Essential (primary) hypertension: Secondary | ICD-10-CM | POA: Diagnosis not present

## 2022-02-23 DIAGNOSIS — E1142 Type 2 diabetes mellitus with diabetic polyneuropathy: Secondary | ICD-10-CM | POA: Diagnosis not present

## 2022-02-23 DIAGNOSIS — E1165 Type 2 diabetes mellitus with hyperglycemia: Secondary | ICD-10-CM | POA: Diagnosis not present

## 2022-02-23 DIAGNOSIS — E039 Hypothyroidism, unspecified: Secondary | ICD-10-CM | POA: Diagnosis not present

## 2022-02-23 DIAGNOSIS — B351 Tinea unguium: Secondary | ICD-10-CM | POA: Diagnosis not present

## 2022-03-01 ENCOUNTER — Ambulatory Visit: Payer: PPO | Attending: Physician Assistant | Admitting: Cardiology

## 2022-03-01 ENCOUNTER — Encounter: Payer: Self-pay | Admitting: Physician Assistant

## 2022-03-01 ENCOUNTER — Other Ambulatory Visit: Payer: Self-pay | Admitting: *Deleted

## 2022-03-01 VITALS — BP 132/62 | HR 74 | Ht 73.0 in | Wt 359.2 lb

## 2022-03-01 DIAGNOSIS — I5022 Chronic systolic (congestive) heart failure: Secondary | ICD-10-CM | POA: Diagnosis not present

## 2022-03-01 DIAGNOSIS — R0602 Shortness of breath: Secondary | ICD-10-CM

## 2022-03-01 DIAGNOSIS — I6529 Occlusion and stenosis of unspecified carotid artery: Secondary | ICD-10-CM | POA: Diagnosis not present

## 2022-03-01 DIAGNOSIS — G4733 Obstructive sleep apnea (adult) (pediatric): Secondary | ICD-10-CM | POA: Diagnosis not present

## 2022-03-01 DIAGNOSIS — E1165 Type 2 diabetes mellitus with hyperglycemia: Secondary | ICD-10-CM | POA: Diagnosis not present

## 2022-03-01 DIAGNOSIS — F331 Major depressive disorder, recurrent, moderate: Secondary | ICD-10-CM | POA: Diagnosis not present

## 2022-03-01 DIAGNOSIS — R0600 Dyspnea, unspecified: Secondary | ICD-10-CM | POA: Diagnosis not present

## 2022-03-01 DIAGNOSIS — Z0000 Encounter for general adult medical examination without abnormal findings: Secondary | ICD-10-CM | POA: Diagnosis not present

## 2022-03-01 DIAGNOSIS — I10 Essential (primary) hypertension: Secondary | ICD-10-CM

## 2022-03-01 DIAGNOSIS — R809 Proteinuria, unspecified: Secondary | ICD-10-CM | POA: Diagnosis not present

## 2022-03-01 DIAGNOSIS — I5020 Unspecified systolic (congestive) heart failure: Secondary | ICD-10-CM

## 2022-03-01 DIAGNOSIS — E039 Hypothyroidism, unspecified: Secondary | ICD-10-CM | POA: Diagnosis not present

## 2022-03-01 DIAGNOSIS — N1831 Chronic kidney disease, stage 3a: Secondary | ICD-10-CM | POA: Diagnosis not present

## 2022-03-01 DIAGNOSIS — E782 Mixed hyperlipidemia: Secondary | ICD-10-CM | POA: Diagnosis not present

## 2022-03-01 MED ORDER — CARVEDILOL 3.125 MG PO TABS: 3.1250 mg | ORAL_TABLET | Freq: Two times a day (BID) | ORAL | 3 refills | Status: DC

## 2022-03-01 NOTE — Patient Instructions (Signed)
Medication Instructions:  Your physician has recommended you make the following change in your medication:   START Carvedilol 3.125 mg taking 1 twice a day    *If you need a refill on your cardiac medications before your next appointment, please call your pharmacy*   Lab Work: None ordered  If you have labs (blood work) drawn today and your tests are completely normal, you will receive your results only by: [place] (if you have MyChart) OR A paper copy in the mail If you have any lab test that is abnormal or we need to change your treatment, we will call you to review the results.   Testing/Procedures: None ordered   Follow-Up: At [facility], you and your health needs are our priority.  As part of our continuing mission to provide you with exceptional heart care, we have created designated Provider Care Teams.  These Care Teams include your primary Cardiologist (physician) and Advanced Practice Providers (APPs -  Physician Assistants and Nurse Practitioners) who all work together to provide you with the care you need, when you need it.  We recommend signing up for the patient portal called "MyChart".  Sign up information is provided on this After Visit Summary.  MyChart is used to connect with patients for Virtual Visits (Telemedicine).  Patients are able to view lab/test results, encounter notes, upcoming appointments, etc.  Non-urgent messages can be sent to your provider as well.   To learn more about what you can do with MyChart, go to [email or web address].    Your next appointment:   4 month(s)  The format for your next appointment:   In Person  Provider:   Mertie Moores, MD      Other Instructions   Important Information About [place]

## 2022-03-01 NOTE — Progress Notes (Signed)
Cardiology Office Note    Date:  03/01/2022   ID:  Ronald, Heath 1945-12-25, MRN [id number]  PCP:  Celene Squibb, MD  Cardiologist:  Mertie Moores, MD  Electrophysiologist:  None   Chief Complaint:   History of Present Illness:   Ronald Heath is a 76 y.o. male with history of left bundle branch block, premature ventricular contractions, heart murmur.   Last seen in our office on 07/11/21 at the request of his PCP for a new LBBB and heart murmur. Myocarfial perfusion imaging was completed and showed no ishemia, EF 34%. Echo complete showed LVEF 40-45% mildly decreased function. Carotid arterial duplex revealed mild bilateral carotid disease.  He presents today for a follow up visit. He continues to lose weight and is taking ozempic to help with weight loss, he is down ~ 100 lbs. He denies chest pain, palpitations, pnd, orthopnea, n, v, dizziness, syncope, edema, weight gain, or early satiety. He does have some SOB at times when exerting himself. 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 He had an office visit with his PCP this am and his BP was 130/60.    Labwork independently reviewed: 08/11/21 - Na 138, K 5.1, Cr 1.08 02/23/22 - LDL 38, A1C 7.9%, Cr 1.31, K 4.8   Cardiology Studies:   Studies reviewed are outlined and summarized above. Reports may be included below. Otherwise see EMR.  08/02/21 myocardial perfusion imaging - Findings are consistent with no prior ischemia and no prior myocardial infarction. The study is intermediate risk. Left ventricular function is abnormal. Global function is moderately reduced. Nuclear stress EF: 34 %. The left ventricular ejection fraction is moderately decreased (30-44%). End diastolic cavity size is severely enlarged. End systolic cavity size is severely enlarged.   08/01/21 echo complete - LVEF 40-45% mildly  decreased function, no RWMA. Moderate concentric LVH. Grade I DD. Left atrial size was severely dilated. Trivial mitral valve  Regurgitation. Aortic dilatation noted. There is mild dilation of the aortic root,  measuring 42 mm. Mild to moderate AS. The aortic valve is calcified.   07/13/21 Carotid arterial duplex - Mild bilateral carotid artery disease      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[place]) 08/01/2021   Aortic stenosis 08/01/2021   Mild to moderate   Aortic valve calcification 08/01/2021   Bilateral carotid artery disease, unspecified type ([place]) 07/13/2021   mild   Depression    Diabetes mellitus without complication ([facility])    Finger infection 12/31/2018   Heart failure with mildly reduced ejection fraction (HFmrEF) ([place]) 08/01/2021   40-45%, mod conc LVH   Hyperlipidemia    Hypertension    Hypothyroidism    Shortness of breath    with exertion   Sleep apnea    pt is supposed to wear CPAP, but doesn't wear it    Past Surgical History:  Procedure Laterality Date   AMPUTATION Left 01/01/2019   Procedure: AMPUTATION LEFT INDEX FINGER;  Surgeon: [name], MD;  Location: [facility];  Service: Orthopedics;  Laterality: Left;   CATARACT EXTRACTION W/PHACO Right 11/21/2012   Procedure: CATARACT EXTRACTION PHACO AND INTRAOCULAR LENS PLACEMENT (IOC);  Surgeon: Tonny Branch, MD;  Location: [facility];  Service: Ophthalmology;  Laterality: Right;  CDE:19.67   CATARACT EXTRACTION W/PHACO Left 12/19/2012   Procedure: CATARACT  EXTRACTION PHACO AND INTRAOCULAR LENS PLACEMENT (IOC);  Surgeon: Tonny Branch, MD;  Location: [facility];  Service: Ophthalmology;  Laterality: Left;  CDE:8.00   KNEE ARTHROSCOPY Left    ROTATOR CUFF REPAIR Right     Current Medications: Current Meds  Medication Sig   albuterol (VENTOLIN HFA) 108 (90 Base) MCG/ACT inhaler 2 Puff(s) By Mouth Every 4 Hours PRN   amLODipine (NORVASC) 10 MG tablet Take 5 mg by mouth daily.    aspirin EC 81 MG  tablet Take 81 mg by mouth daily.   atorvastatin (LIPITOR) 20 MG tablet Take 20 mg by mouth daily.   azelastine (ASTELIN) 0.1 % nasal spray Place 2 sprays into both nostrils 2 (two) times daily.   carvedilol (COREG) 3.125 MG tablet Take 1 tablet (3.125 mg total) by mouth 2 (two) times daily.   dapagliflozin propanediol (FARXIGA) 5 MG TABS tablet Take 5 mg by mouth every morning.   glucose blood test strip 1 each by Other route 4 (four) times daily. Use as instructed   hydrALAZINE (APRESOLINE) 50 MG tablet Take 50 mg by mouth 3 (three) times daily.   insulin detemir (LEVEMIR FLEXPEN) 100 UNIT/ML FlexPen Inject 42 Units into the skin at bedtime.   insulin lispro (HUMALOG) 100 UNIT/ML KwikPen Inject 8-14 Units into the skin 3 (three) times daily.   levothyroxine (SYNTHROID, LEVOTHROID) 137 MCG tablet Take 137 mcg by mouth daily before breakfast.   metFORMIN (GLUCOPHAGE-XR) 500 MG 24 hr tablet Take 1,000 mg by mouth 2 (two) times daily.   olmesartan (BENICAR) 40 MG tablet Take 40 mg by mouth daily.   OneTouch Delica Lancets 45Y MISC 1 each by Does not apply route 4 (four) times daily.   oxyCODONE (OXY IR/ROXICODONE) 5 MG immediate release tablet Take 5 mg by mouth every 6 (six) hours as needed.   PARoxetine (PAXIL) 40 MG tablet Take 20 mg by mouth every morning.   pramipexole (MIRAPEX) 1 MG tablet Take 1 mg by mouth at bedtime.   Semaglutide (OZEMPIC, 1 MG/DOSE, [place]) Inject 1 mg into the skin once a week.   vitamin B-12 (CYANOCOBALAMIN) 1000 MCG tablet Take 1,000 mcg by mouth daily.     Allergies:   Patient has no known allergies.   Social History   Socioeconomic History   Marital status: Widowed    Spouse name: Not on file   Number of children: Not on file   Years of education: Not on file   Highest education level: Not on file  Occupational History   Not on file  Tobacco Use   Smoking status: Former    Packs/day: 2.00    Years: 15.00    Total pack years: 30.00    Types: Cigarettes    Smokeless tobacco: Never  Vaping Use   Vaping Use: Never used  Substance and Sexual Activity   Alcohol use: Yes    Alcohol/week: 2.0 standard drinks of alcohol    Types: 2 Cans of beer per week   Drug use: No   Sexual activity: Not on file  Other Topics Concern   Not on file  Social History Narrative   Not on file   Social Determinants of Health   Financial Resource Strain: Not on file  Food Insecurity: Not on file  Transportation Needs: Not on file  Physical Activity: Not on file  Stress: Not on file  Social Connections: Not on file     Family History:  The patient's family history includes Diabetes Mellitus II in  his father; Stroke in his father.  ROS:   Review of Systems  Constitutional:  Positive for weight loss (intentional).  HENT: Negative.    Eyes:  Negative for blurred vision and double vision.  Respiratory:  Positive for shortness of breath (at times with exertion). Negative for cough.   Cardiovascular:  Positive for leg swelling. Negative for chest pain, palpitations, orthopnea and PND.  Gastrointestinal: Negative.   Genitourinary: Negative.   Musculoskeletal:  Positive for joint pain. Negative for falls.  Skin: Negative.   Neurological:  Negative for dizziness and headaches.  Endo/Heme/Allergies: Negative.   Psychiatric/Behavioral: Negative.        EKG(s)/Additional Labs   EKG:  EKG is not ordered today.   Recent Labs: 08/11/2021: ALT 18; BUN 21; Creatinine, Ser 1.08; Potassium 5.1; Sodium 138; TSH 1.340  Recent Lipid Panel    Component Value Date/Time   CHOL 91 (L) 08/11/2021 0825   TRIG 59 08/11/2021 0825   HDL 41 08/11/2021 0825   CHOLHDL 2.2 08/11/2021 0825   LDLCALC 36 08/11/2021 0825    PHYSICAL EXAM:    VS:  BP 132/62   Pulse 74   Ht '6\' 1"'$  (1.854 m)   Wt (!) 359 lb 3.2 oz (162.9 kg)   SpO2 96%   BMI 47.39 kg/m   BMI: Body mass index is 47.39 kg/m.  Physical Exam Constitutional:      General: He is not in acute distress.     Appearance: Normal appearance. He is obese. He is not toxic-appearing.  Cardiovascular:     Rate and Rhythm: Normal rate and regular rhythm.     Pulses: Normal pulses.     Heart sounds: Murmur heard.  Pulmonary:     Breath sounds: Normal breath sounds.  Abdominal:     Palpations: Abdomen is soft.  Musculoskeletal:        General: Swelling present.     Right lower leg: Edema (lymphadema) present.     Left lower leg: Edema present.  Skin:    General: Skin is warm and dry.  Neurological:     Mental Status: He is alert and oriented to person, place, and time.  Psychiatric:        Behavior: Behavior normal.      Wt Readings from Last 3 Encounters:  03/01/22 (!) 359 lb 3.2 oz (162.9 kg)  12/13/21 (!) 371 lb 12.8 oz (168.6 kg)  11/22/21 (!) 369 lb 3.2 oz (167.5 kg)     ASSESSMENT & PLAN:   Heart failure with mildly reduced ejection fraction - He appears to be euvolemic. NYHA class II. He is actively losing weight and weighs himself daily. Currently on Benicar, Farxiga ('5mg'$  dose per PCP). Will start coreg 3.125 mg twice daily. Discussed GDMT and that we will continue to make changes at subsequent visits to hopefully get him to feel better. Could consider stopping his amlodipine and Benicar and adding Entresto. Recent potassium levels may prohibit MRA, K 5>4.8. Will check BNP as well. Other labs collected this am by his PCP.  HTN - BP today 148/60, rechecked 132/62. He does not check his BP at home. Will add coreg 3.125 mg twice daily for GDMT. He does forget his middle day dose of hydralazine frequently, in spite of this his BP is good today. Could consider changing/stopping his amlodipine and benicar in the future in order to add Entresto.  Bilateral carotid artery disease - Mild bilateral carotid artery disease per carotid duplex. Denies dizziness or syncope. LDL  38, well controlled.      Disposition: F/u with Dr. Acie Fredrickson, recommended 3 months but that will be tax season and he said he  will be unavailable until after 07/02/21.    Medication Adjustments/Labs and Tests Ordered: Current medicines are reviewed at length with the patient today.  Concerns regarding medicines are outlined above. Medication changes, Labs and Tests ordered today are summarized above and listed in the Patient Instructions accessible in Encounters.   Signed, Trudi Ida, NP  03/01/2022 4:15 PM    [place] Phone: [phone]; Fax: [phone]

## 2022-03-03 ENCOUNTER — Other Ambulatory Visit (HOSPITAL_COMMUNITY)
Admission: RE | Admit: 2022-03-03 | Discharge: 2022-03-03 | Disposition: A | Discharge status: Home / Self Care | Payer: PPO | Source: Ambulatory Visit | Attending: Cardiology | Admitting: Cardiology

## 2022-03-03 ENCOUNTER — Telehealth: Payer: Self-pay | Admitting: *Deleted

## 2022-03-03 DIAGNOSIS — R0602 Shortness of breath: Secondary | ICD-10-CM | POA: Insufficient documentation

## 2022-03-03 LAB — BRAIN NATRIURETIC PEPTIDE: B Natriuretic Peptide: 103 pg/mL — ABNORMAL HIGH (ref 0.0–100.0)

## 2022-03-03 NOTE — Telephone Encounter (Signed)
-----   Message from Trudi Ida, NP sent at 03/03/2022 12:33 PM EST ----- Good afternoon,  Will you please call Mr. Babino and advise him that his BNP level was the slightest bit elevated according to our threshold. Other labs may not even register his reading as above the normal level. If he has any weight gain (he is actively losing weight and I do not anticipate he will gain), or notices his swelling  in his legs is getting worse, he can call us back and we may need to send a prescription in for lasix. For now, just continue with current meds. Please make sure he picked up his coreg as well... I created some confusion for him initially with this but did leave him a message to pick up and take. This will help his heart beat more effectively.  Thank you, Anderson Malta  ----- Message ----- From: Buel Ream, [facility] [place] Sent: 03/03/2022  10:11 AM EST To: Trudi Ida, NP

## 2022-03-03 NOTE — Progress Notes (Signed)
Pt has been made aware of normal result and verbalized understanding.  jw

## 2022-04-10 ENCOUNTER — Ambulatory Visit (HOSPITAL_COMMUNITY): Payer: PPO | Attending: Internal Medicine

## 2022-04-10 DIAGNOSIS — Z87891 Personal history of nicotine dependence: Secondary | ICD-10-CM | POA: Insufficient documentation

## 2022-04-10 DIAGNOSIS — G4730 Sleep apnea, unspecified: Secondary | ICD-10-CM | POA: Insufficient documentation

## 2022-04-10 DIAGNOSIS — I77810 Thoracic aortic ectasia: Secondary | ICD-10-CM | POA: Diagnosis not present

## 2022-04-10 DIAGNOSIS — I110 Hypertensive heart disease with heart failure: Secondary | ICD-10-CM | POA: Insufficient documentation

## 2022-04-10 DIAGNOSIS — E785 Hyperlipidemia, unspecified: Secondary | ICD-10-CM | POA: Diagnosis not present

## 2022-04-10 DIAGNOSIS — R9430 Abnormal result of cardiovascular function study, unspecified: Secondary | ICD-10-CM | POA: Diagnosis not present

## 2022-04-10 DIAGNOSIS — I340 Nonrheumatic mitral (valve) insufficiency: Secondary | ICD-10-CM | POA: Diagnosis not present

## 2022-04-10 DIAGNOSIS — R0602 Shortness of breath: Secondary | ICD-10-CM | POA: Diagnosis not present

## 2022-04-10 DIAGNOSIS — I509 Heart failure, unspecified: Secondary | ICD-10-CM | POA: Diagnosis not present

## 2022-04-10 DIAGNOSIS — Z8249 Family history of ischemic heart disease and other diseases of the circulatory system: Secondary | ICD-10-CM | POA: Insufficient documentation

## 2022-04-10 LAB — ECHOCARDIOGRAM COMPLETE
Area-P 1/2: 2.87 cm2
S' Lateral: 4.3 cm

## 2022-04-13 ENCOUNTER — Ambulatory Visit: Payer: PPO | Admitting: Urology

## 2022-04-14 ENCOUNTER — Telehealth: Payer: Self-pay | Admitting: Cardiovascular Disease

## 2022-04-14 DIAGNOSIS — Z79899 Other long term (current) drug therapy: Secondary | ICD-10-CM

## 2022-04-14 DIAGNOSIS — I5022 Chronic systolic (congestive) heart failure: Secondary | ICD-10-CM

## 2022-04-14 MED ORDER — SACUBITRIL-VALSARTAN 97-103 MG PO TABS: 1.0000 | ORAL_TABLET | Freq: Two times a day (BID) | ORAL | 11 refills | Status: DC

## 2022-04-14 NOTE — Telephone Encounter (Signed)
Pt is returning call in regards to echo results. Requesting return call.

## 2022-04-14 NOTE — Telephone Encounter (Signed)
-----  Message from Thayer Headings, MD sent at 04/14/2022  8:58 AM EST ----- Anderson Malta, Thank you for seeing Bill I like your idea of stopping amlodipine and benicar and starting entresto  His LV systolic function remains unchanged and mildly depressed   Tanyia Grabbe, Please DC amlodipine , DC benicar Start Entresto 97-103 BID  BMP in 3 weeks  He has an appt to see me in late April   PN

## 2022-04-14 NOTE — Telephone Encounter (Signed)
Called and spoke with patient who verbalized understanding on starting Entresto and stopping Amlodipine and Olmesartan. Medication sent to pharmacy. BMET ordered and scheduled for 05/05/22. He will call us if he has any issues getting the new medication.

## 2022-04-17 ENCOUNTER — Encounter: Payer: Self-pay | Admitting: Nurse Practitioner

## 2022-04-17 ENCOUNTER — Ambulatory Visit (INDEPENDENT_AMBULATORY_CARE_PROVIDER_SITE_OTHER): Payer: PPO | Admitting: Nurse Practitioner

## 2022-04-17 VITALS — BP 148/78 | HR 58 | Ht 73.0 in | Wt 360.4 lb

## 2022-04-17 DIAGNOSIS — E782 Mixed hyperlipidemia: Secondary | ICD-10-CM

## 2022-04-17 DIAGNOSIS — I10 Essential (primary) hypertension: Secondary | ICD-10-CM | POA: Diagnosis not present

## 2022-04-17 DIAGNOSIS — E119 Type 2 diabetes mellitus without complications: Secondary | ICD-10-CM

## 2022-04-17 DIAGNOSIS — E039 Hypothyroidism, unspecified: Secondary | ICD-10-CM

## 2022-04-17 NOTE — Progress Notes (Signed)
Endocrinology Follow Up Note       04/17/2022, 1:30 PM   Subjective:    Patient ID: Ronald Heath, male    DOB: [birth year].  Ronald Heath is being seen in follow up after being seen in consultation for management of currently uncontrolled symptomatic diabetes requested by  Celene Squibb, MD.  He is transferring care from his previous Endocrinologist Dr. Elyse Hsu who retired.   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[place]) 08/01/2021   Aortic stenosis 08/01/2021   Mild to moderate   Aortic valve calcification 08/01/2021   Bilateral carotid artery disease, unspecified type ([place]) 07/13/2021   mild   Depression    Diabetes mellitus without complication (HCC)    Finger infection 12/31/2018   Heart failure with mildly reduced ejection fraction (HFmrEF) ([place]) 08/01/2021   40-45%, mod conc LVH   Hyperlipidemia    Hypertension    Hypothyroidism    Shortness of breath    with exertion   Sleep apnea    pt is supposed to wear CPAP, but doesn't wear it    Past Surgical History:  Procedure Laterality Date   AMPUTATION Left 01/01/2019   Procedure: AMPUTATION LEFT INDEX FINGER;  Surgeon: [name], MD;  Location: [facility];  Service: Orthopedics;  Laterality: Left;   CATARACT EXTRACTION W/PHACO Right 11/21/2012   Procedure: CATARACT EXTRACTION PHACO AND INTRAOCULAR LENS PLACEMENT (IOC);  Surgeon: Tonny Branch, MD;  Location: [facility];  Service: Ophthalmology;  Laterality: Right;  CDE:19.67   CATARACT EXTRACTION W/PHACO Left 12/19/2012   Procedure: CATARACT EXTRACTION PHACO AND INTRAOCULAR LENS PLACEMENT (IOC);  Surgeon: Tonny Branch, MD;  Location: [facility];  Service: Ophthalmology;  Laterality: Left;  CDE:8.00   KNEE ARTHROSCOPY Left    ROTATOR CUFF REPAIR Right     Social History   Socioeconomic History   Marital status: Widowed    Spouse name: Not on file   Number of children: Not on file    Years of education: Not on file   Highest education level: Not on file  Occupational History   Not on file  Tobacco Use   Smoking status: Former    Packs/day: 2.00    Years: 15.00    Total pack years: 30.00    Types: Cigarettes   Smokeless tobacco: Never  Vaping Use   Vaping Use: Never used  Substance and Sexual Activity   Alcohol use: Yes    Alcohol/week: 2.0 standard drinks of alcohol    Types: 2 Cans of beer per week   Drug use: No   Sexual activity: Not on file  Other Topics Concern   Not on file  Social History Narrative   Not on file   Social Determinants of Health   Financial Resource Strain: Not on file  Food Insecurity: Not on file  Transportation Needs: Not on file  Physical Activity: Not on file  Stress: Not on file  Social Connections: Not on file    Family History  Problem Relation Age of Onset   Diabetes Mellitus II Father    Stroke Father     Outpatient Encounter Medications as of 04/17/2022  Medication Sig  albuterol (VENTOLIN HFA) 108 (90 Base) MCG/ACT inhaler 2 Puff(s) By Mouth Every 4 Hours PRN   aspirin EC 81 MG tablet Take 81 mg by mouth daily.   atorvastatin (LIPITOR) 20 MG tablet Take 20 mg by mouth daily.   azelastine (ASTELIN) 0.1 % nasal spray Place 2 sprays into both nostrils 2 (two) times daily.   carvedilol (COREG) 3.125 MG tablet Take 1 tablet (3.125 mg total) by mouth 2 (two) times daily.   dapagliflozin propanediol (FARXIGA) 10 MG TABS tablet Take 10 mg by mouth daily.   glucose blood test strip 1 each by Other route 4 (four) times daily. Use as instructed   hydrALAZINE (APRESOLINE) 50 MG tablet Take 50 mg by mouth 3 (three) times daily.   insulin detemir (LEVEMIR FLEXPEN) 100 UNIT/ML FlexPen Inject 42 Units into the skin at bedtime.   levothyroxine (SYNTHROID, LEVOTHROID) 137 MCG tablet Take 137 mcg by mouth daily before breakfast.   metFORMIN (GLUCOPHAGE-XR) 500 MG 24 hr tablet Take 1,000 mg by mouth 2 (two) times daily.    OneTouch Delica Lancets 36U MISC 1 each by Does not apply route 4 (four) times daily.   oxyCODONE (OXY IR/ROXICODONE) 5 MG immediate release tablet Take 5 mg by mouth every 6 (six) hours as needed.   PARoxetine (PAXIL) 40 MG tablet Take 20 mg by mouth every morning.   pramipexole (MIRAPEX) 1 MG tablet Take 1 mg by mouth at bedtime.   Semaglutide (OZEMPIC, 1 MG/DOSE, [place]) Inject 1 mg into the skin once a week.   vitamin B-12 (CYANOCOBALAMIN) 1000 MCG tablet Take 1,000 mcg by mouth daily.   [DISCONTINUED] insulin lispro (HUMALOG) 100 UNIT/ML KwikPen Inject 8-14 Units into the skin 3 (three) times daily.   sacubitril-valsartan (ENTRESTO) 97-103 MG Take 1 tablet by mouth 2 (two) times daily. (Patient not taking: Reported on 04/17/2022)   [DISCONTINUED] amLODipine (NORVASC) 10 MG tablet Take 5 mg by mouth daily.    [DISCONTINUED] olmesartan (BENICAR) 40 MG tablet Take 40 mg by mouth daily.   No facility-administered encounter medications on file as of 04/17/2022.    ALLERGIES: No Known Allergies  VACCINATION STATUS: Immunization History  Administered Date(s) Administered   Influenza, High Dose Seasonal PF 01/16/2018   Influenza-Unspecified 12/19/2019   Moderna Sars-Covid-2 Vaccination 05/31/2019, 06/21/2019, 02/19/2020   Tdap 12/30/2018    Diabetes He presents for his follow-up diabetic visit. He has type 2 diabetes mellitus. Onset time: Diagnosed at approx age of 55. His disease course has been fluctuating. Hypoglycemia symptoms include sweats. Pertinent negatives for hypoglycemia include no nervousness/anxiousness or tremors. Associated symptoms include foot paresthesias and weight loss. Pertinent negatives for diabetes include no fatigue, no polydipsia and no polyuria. There are no hypoglycemic complications. Symptoms are stable. Diabetic complications include nephropathy, peripheral neuropathy and retinopathy. Risk factors for coronary artery disease include diabetes mellitus, dyslipidemia,  family history, hypertension, male sex, obesity and sedentary lifestyle. Current diabetic treatment includes intensive insulin program and oral agent (dual therapy) (and Ozempic). He is compliant with treatment most of the time. His weight is decreasing steadily. He is following a generally healthy diet. When asked about meal planning, he reported none. He has not had a previous visit with a dietitian. He never participates in exercise. His home blood glucose trend is decreasing steadily. His overall blood glucose range is 90-110 mg/dl. (He presents today with his meter, no logs, showing he is only checking glucose once daily before breakfast.  His previsit A1c on 02/23/22 at his PCP office was  7.9%, increasing from last visit of 7%.  He continues to take his Humalog at night even though I cautioned him due to safety issues.  He has lost 12 lbs since last visit.  Analysis of his meter shows 7-day average of 109, 14-day average of 100, 30-day average of 102, 90-day average of 105.  He has frequent fasting hypoglycemia, as low as 52.  He has CGM at home but has not been using it. ) An ACE inhibitor/angiotensin II receptor blocker is being taken. He sees a podiatrist.Eye exam is current.  Hyperlipidemia This is a chronic problem. The current episode started more than 1 year ago. The problem is controlled. Recent lipid tests were reviewed and are normal. Exacerbating diseases include chronic renal disease, diabetes, hypothyroidism and obesity. Factors aggravating his hyperlipidemia include fatty foods and thiazides. Current antihyperlipidemic treatment includes statins. The current treatment provides significant improvement of lipids. Compliance problems include adherence to diet and adherence to exercise.  Risk factors for coronary artery disease include diabetes mellitus, dyslipidemia, hypertension, male sex, obesity and a sedentary lifestyle.  Hypertension This is a chronic problem. The current episode started  more than 1 year ago. The problem has been gradually improving since onset. The problem is controlled. Associated symptoms include peripheral edema and sweats. Pertinent negatives include no palpitations. Agents associated with hypertension include thyroid hormones. Risk factors for coronary artery disease include diabetes mellitus, dyslipidemia, family history, obesity, male gender and sedentary lifestyle. Past treatments include calcium channel blockers, diuretics and angiotensin blockers. The current treatment provides mild improvement. Compliance problems include diet and exercise.  Hypertensive end-organ damage includes kidney disease and retinopathy. Identifiable causes of hypertension include chronic renal disease and a thyroid problem.  Thyroid Problem Presents for follow-up visit. Symptoms include weight loss. Patient reports no anxiety, cold intolerance, constipation, depressed mood, fatigue, heat intolerance, leg swelling, palpitations, tremors or weight gain. The symptoms have been stable. His past medical history is significant for diabetes and hyperlipidemia.     Review of systems  Constitutional: + steadily decreasing body weight, current Body mass index is 47.55 kg/m.,  no subjective hyperthermia, no subjective hypothermia Eyes: no blurry vision, no xerophthalmia ENT: no sore throat, no nodules palpated in throat, no dysphagia/odynophagia, no hoarseness Cardiovascular: no chest pain, no shortness of breath, no palpitations, + ankle swelling Respiratory: no cough, no shortness of breath Gastrointestinal: no nausea/vomiting/diarrhea Musculoskeletal: right knee pain (needs TKR) Skin: no rashes, no hyperemia Neurological: no tremors, no numbness, no tingling, no dizziness Psychiatric: no depression, no anxiety  Objective:     BP (!) 148/78 Comment: Manuel Cuff  Pulse (!) 58   Ht '6\' 1"'$  (1.854 m)   Wt (!) 360 lb 6.4 oz (163.5 kg)   BMI 47.55 kg/m   Wt Readings from Last 3  Encounters:  04/17/22 (!) 360 lb 6.4 oz (163.5 kg)  03/01/22 (!) 359 lb 3.2 oz (162.9 kg)  12/13/21 (!) 371 lb 12.8 oz (168.6 kg)     BP Readings from Last 3 Encounters:  04/17/22 (!) 148/78  03/01/22 132/62  11/22/21 (!) 177/63      Physical Exam- Limited  Constitutional:  Body mass index is 47.55 kg/m. , not in acute distress, normal state of mind Eyes:  EOMI, no exophthalmos Musculoskeletal: no gross deformities, strength intact in all four extremities, no gross restriction of joint movements Skin:  no rashes, no hyperemia Neurological: no tremor with outstretched hands   Diabetic Foot Exam - Simple   No data filed  CMP ( most recent) CMP     Component Value Date/Time   NA 138 08/11/2021 0825   K 5.1 08/11/2021 0825   CL 104 08/11/2021 0825   CO2 23 08/11/2021 0825   GLUCOSE 152 (H) 08/11/2021 0825   GLUCOSE 120 (H) 11/03/2019 1035   BUN 21 08/11/2021 0825   CREATININE 1.08 08/11/2021 0825   CREATININE 1.19 (H) 11/03/2019 1035   CALCIUM 8.8 08/11/2021 0825   PROT 6.3 08/11/2021 0825   ALBUMIN 3.7 08/11/2021 0825   AST 18 08/11/2021 0825   ALT 18 08/11/2021 0825   ALKPHOS 75 08/11/2021 0825   BILITOT 0.3 08/11/2021 0825   GFRNONAA 73 07/27/2020 0000   GFRNONAA 60 11/03/2019 1035   GFRAA 70 11/03/2019 1035     Diabetic Labs (most recent): Lab Results  Component Value Date   HGBA1C 7.0 (A) 12/13/2021   HGBA1C 7.6 09/08/2021   HGBA1C 6.9 05/16/2021     Lipid Panel ( most recent) Lipid Panel     Component Value Date/Time   CHOL 91 (L) 08/11/2021 0825   TRIG 59 08/11/2021 0825   HDL 41 08/11/2021 0825   CHOLHDL 2.2 08/11/2021 0825   LDLCALC 36 08/11/2021 0825   LABVLDL 14 08/11/2021 0825      Lab Results  Component Value Date   TSH 1.340 08/11/2021   TSH 1.440 01/03/2021   FREET4 1.46 08/11/2021   FREET4 1.27 01/03/2021           Assessment & Plan:   1) Diabetes mellitus without complication ([place])  He presents today with his  meter, no logs, showing he is only checking glucose once daily before breakfast.  His previsit A1c on 02/23/22 at his PCP office was 7.9%, increasing from last visit of 7%.  He continues to take his Humalog at night even though I cautioned him due to safety issues.  He has lost 12 lbs since last visit.  Analysis of his meter shows 7-day average of 109, 14-day average of 100, 30-day average of 102, 90-day average of 105.  He has frequent fasting hypoglycemia, as low as 52.  He has CGM at home but has not been using it.  - Ronald Heath has currently uncontrolled symptomatic type 2 DM since 77 years of age.  -Recent labs reviewed.  - I had a long discussion with him about the progressive nature of diabetes and the pathology behind its complications. 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 These are all discussed in detail with him.  - Nutritional counseling repeated at each appointment due to patients tendency to fall back in to old habits.  - The patient admits there is a room for improvement in their diet and drink choices. -  Suggestion is made for the patient to avoid simple carbohydrates from their diet including Cakes, Sweet Desserts / Pastries, Ice Cream, Soda (diet and regular), Sweet Tea, Candies, Chips, Cookies, Sweet Pastries, Store Bought Juices, Alcohol in Excess of 1-2 drinks a day, Artificial Sweeteners, Coffee Creamer, and "Sugar-free" Products. This will help patient to have stable blood glucose profile and potentially avoid unintended weight gain.   - I encouraged the patient to switch to unprocessed or minimally processed complex starch and increased protein intake (animal or plant source), fruits, and vegetables.   - Patient is advised to stick to a routine mealtimes to eat  3 meals a day and avoid unnecessary snacks (to snack only to  correct hypoglycemia).  The following Lifestyle Medicine recommendations according to [place]  [facility]) were discussed and and offered to patient and he  agrees to start the journey:  A. Whole Foods, Plant-Based Nutrition comprising of fruits and vegetables, plant-based proteins, whole-grain carbohydrates was discussed in detail with the patient.   A list for source of those nutrients were also provided to the patient.  Patient will use only water or unsweetened tea for hydration. B.  The need to stay away from risky substances including alcohol, smoking; obtaining 7 to 9 hours of restorative sleep, at least 150 minutes of moderate intensity exercise weekly, the importance of healthy social connections,  and stress management techniques were discussed. C.  A full color page of  Calorie density of various food groups per pound showing examples of each food groups was provided to the patient.  - I have approached him with the following individualized plan to manage his diabetes and patient agrees:   -He is advised to continue his Lantus 45 units SQ nightly and to stop the Humalog altogether.  He can continue Metformin 1000 mg po twice daily with meals and Ozempic 1 mg SQ weekly.   -he is encouraged to continue monitoring glucose 4 times daily, before meals and before bed, and to call the clinic if he has readings less than 70 or greater than 200 for 3 tests in a row.  He could greatly benefit from CGM device, especially since he has random hypoglycemia.  He received his CGM from [employer], but has not started wearing it.  I encouraged him to bring it here for assistance in applying.  - he is warned not to take insulin without proper monitoring per orders.  - Adjustment parameters are given to him for hypo and hyperglycemia in writing.  - Specific targets for  A1c; LDL, HDL, and Triglycerides were discussed with the patient.  2) Blood Pressure /Hypertension: -His blood  pressure is not controlled to target but is stable.  he is advised to continue his current medications including Norvasc 10 mg p.o. daily with breakfast, Hydralazine 50 mg po TID, and Olmesartan 40 mg po daily.   3) Lipids/Hyperlipidemia:    Review of his recent lipid panel from 02/23/22 showed controlled LDL at 38.  He is advised to continue Lipitor 20 mg p.o. daily at bedtime.  Side effects and precautions discussed with him.  4)  Weight/Diet:  his Body mass index is 47.55 kg/m.  -  clearly complicating his diabetes care.  he is a candidate for weight loss. I discussed with him the fact that loss of 5 - 10% of his  current body weight will have the most impact on his diabetes management.  Exercise, and detailed carbohydrates information provided  -  detailed on discharge instructions.    5) Hypothyroidism- unspecified His previsit TFTs are consistent with appropriate hormone replacement.  He is advised to continue Levothyroxine 137 mcg p.o. daily before breakfast.    - The correct intake of thyroid hormone (Levothyroxine, Synthroid), is on empty stomach first thing in the morning, with water, separated by at least 30 minutes from breakfast and other medications,  and separated by more than 4 hours from calcium, iron, multivitamins, acid reflux medications (PPIs).  - This medication is a life-long medication and will be needed to correct thyroid hormone imbalances for the rest of your life.  The dose  may change from time to time, based on thyroid blood work.  - It is extremely important to be consistent taking this medication, near the same time each morning.  -AVOID TAKING PRODUCTS CONTAINING BIOTIN (commonly found in Hair, Skin, Nails vitamins) AS IT INTERFERES WITH THE VALIDITY OF THYROID FUNCTION BLOOD TESTS.  6) Chronic Care/Health Maintenance: -he is on ACEI/ARB and Statin medications and is encouraged to initiate and continue to follow up with Ophthalmology, Dentist, Podiatrist at least  yearly or according to recommendations, and advised to stay away from smoking. I have recommended yearly flu vaccine and pneumonia vaccine at least every 5 years; moderate intensity exercise for up to 150 minutes weekly; and sleep for at least 7 hours a day.  - he is advised to maintain close follow up with Celene Squibb, MD for primary care needs, as well as his other providers for optimal and coordinated care.     I spent 31 minutes in the care of the patient today including review of labs from [facility], Lipids, Thyroid Function, Hematology (current and previous including abstractions from other facilities); face-to-face time discussing  his blood glucose readings/logs, discussing hypoglycemia and hyperglycemia episodes and symptoms, medications doses, his options of short and long term treatment based on the latest standards of care / guidelines;  discussion about incorporating lifestyle medicine;  and documenting the encounter. Risk reduction counseling performed per USPSTF guidelines to reduce obesity and cardiovascular risk factors.     Please refer to Patient Instructions for Blood Glucose Monitoring and Insulin/Medications Dosing Guide"  in media tab for additional information. Please  also refer to " Patient Self Inventory" in the Media  tab for reviewed elements of pertinent patient history.  Adelfa Koh participated in the discussions, expressed understanding, and voiced agreement with the above plans.  All questions were answered to his satisfaction. he is encouraged to contact clinic should he have any questions or concerns prior to his return visit.   Follow up plan: - Return in about 4 months (around 08/16/2022) for Diabetes F/U with A1c in office, No previsit labs, Bring meter and logs.   Rayetta Pigg, [facility] [address] [place], [place] [address] Phone: [phone] Fax: [phone]  04/17/2022, 1:30 PM

## 2022-04-17 NOTE — Telephone Encounter (Signed)
Returned call to patient who states he didn't need anything at this time. ECHo already discussed. Has not picked up Entresto from pharmacy yet.

## 2022-04-20 ENCOUNTER — Telehealth: Payer: Self-pay | Admitting: Cardiovascular Disease

## 2022-04-20 DIAGNOSIS — Z79899 Other long term (current) drug therapy: Secondary | ICD-10-CM

## 2022-04-20 NOTE — Telephone Encounter (Signed)
Pt c/o medication issue:  1. Name of Medication:   2. How are you currently taking this medication (dosage and times per day)?   3. Are you having a reaction (difficulty breathing--STAT)?   4. What is your medication issue? Patient wants to know what two medicine he was told to stop taking last week?

## 2022-04-25 NOTE — Telephone Encounter (Signed)
Pt is calling to get update on which medications he is to stop and start according to last visit. Requesting return call.

## 2022-04-26 NOTE — Telephone Encounter (Signed)
Left message asking patient to call office back to review recent medication changes. Didn't want to leave message per DPR because I want to ensure patient understands what he is supposed to be taking. Pt was started on Coreg 3.'125mg'$  BID at last visit here on 03/01/22 with Venia Carbon, NP. Per ECHO result note on 04/14/22 (see telephone encounter), pt was to stop taking Amlodipine and Olmesartan (Benicar) and was to begin Entresto 97/'103mg'$  BID. Medication was sent to his pharmacy. He had expressed initial concern over cost, but explained that his first month should be free with coupon that accompanied Rx(Please Honor Card patient is presenting for Carmie Kanner: [id number]; Juanna Cao: [id number]; [facility]: [facility]; [facility]: [phone]) and he was going to call back if he had issues, but didn't hear anything. Awaiting call back.

## 2022-04-26 NOTE — Telephone Encounter (Signed)
Pt returning call

## 2022-04-28 NOTE — Telephone Encounter (Signed)
Patient returned call to office and reviewed medications and gave information for [facility] coupon. He requests to get BMET done at [facility] office instead of here due to convenience (ordered for [facility] start, to be done 3 weeks afterwards). No further questions.

## 2022-04-28 NOTE — Telephone Encounter (Signed)
Attempted to reach patient again, left additional message to call back.

## 2022-05-04 DIAGNOSIS — B351 Tinea unguium: Secondary | ICD-10-CM | POA: Diagnosis not present

## 2022-05-04 DIAGNOSIS — E1142 Type 2 diabetes mellitus with diabetic polyneuropathy: Secondary | ICD-10-CM | POA: Diagnosis not present

## 2022-05-05 ENCOUNTER — Other Ambulatory Visit: Payer: PPO

## 2022-05-24 ENCOUNTER — Other Ambulatory Visit (HOSPITAL_COMMUNITY)
Admission: RE | Admit: 2022-05-24 | Discharge: 2022-05-24 | Disposition: A | Discharge status: Home / Self Care | Payer: PPO | Source: Ambulatory Visit | Attending: Cardiovascular Disease | Admitting: Cardiovascular Disease

## 2022-05-24 ENCOUNTER — Other Ambulatory Visit: Payer: PPO

## 2022-05-24 DIAGNOSIS — I5022 Chronic systolic (congestive) heart failure: Secondary | ICD-10-CM | POA: Diagnosis not present

## 2022-05-24 DIAGNOSIS — Z79899 Other long term (current) drug therapy: Secondary | ICD-10-CM

## 2022-05-24 LAB — BASIC METABOLIC PANEL
Anion gap: 7 (ref 5–15)
BUN: 19 mg/dL (ref 8–23)
CO2: 25 mmol/L (ref 22–32)
Calcium: 8.6 mg/dL — ABNORMAL LOW (ref 8.9–10.3)
Chloride: 107 mmol/L (ref 98–111)
Creatinine, Ser: 1.25 mg/dL — ABNORMAL HIGH (ref 0.61–1.24)
GFR, Estimated: >60 mL/min (ref >60)
Glucose, Bld: 131 mg/dL — ABNORMAL HIGH (ref 70–99)
Potassium: 4.3 mmol/L (ref 3.5–5.1)
Sodium: 139 mmol/L (ref 135–145)

## 2022-06-28 DIAGNOSIS — Z1211 Encounter for screening for malignant neoplasm of colon: Secondary | ICD-10-CM | POA: Diagnosis not present

## 2022-06-28 DIAGNOSIS — D519 Vitamin B12 deficiency anemia, unspecified: Secondary | ICD-10-CM | POA: Diagnosis not present

## 2022-06-28 DIAGNOSIS — I10 Essential (primary) hypertension: Secondary | ICD-10-CM | POA: Diagnosis not present

## 2022-06-28 DIAGNOSIS — E1165 Type 2 diabetes mellitus with hyperglycemia: Secondary | ICD-10-CM | POA: Diagnosis not present

## 2022-07-04 DIAGNOSIS — I10 Essential (primary) hypertension: Secondary | ICD-10-CM | POA: Diagnosis not present

## 2022-07-04 DIAGNOSIS — R809 Proteinuria, unspecified: Secondary | ICD-10-CM | POA: Diagnosis not present

## 2022-07-04 DIAGNOSIS — Z0000 Encounter for general adult medical examination without abnormal findings: Secondary | ICD-10-CM | POA: Diagnosis not present

## 2022-07-04 DIAGNOSIS — N1831 Chronic kidney disease, stage 3a: Secondary | ICD-10-CM | POA: Diagnosis not present

## 2022-07-04 DIAGNOSIS — E782 Mixed hyperlipidemia: Secondary | ICD-10-CM | POA: Diagnosis not present

## 2022-07-04 DIAGNOSIS — R0600 Dyspnea, unspecified: Secondary | ICD-10-CM | POA: Diagnosis not present

## 2022-07-04 DIAGNOSIS — E1165 Type 2 diabetes mellitus with hyperglycemia: Secondary | ICD-10-CM | POA: Diagnosis not present

## 2022-07-04 DIAGNOSIS — F329 Major depressive disorder, single episode, unspecified: Secondary | ICD-10-CM | POA: Diagnosis not present

## 2022-07-04 DIAGNOSIS — I130 Hypertensive heart and chronic kidney disease with heart failure and stage 1 through stage 4 chronic kidney disease, or unspecified chronic kidney disease: Secondary | ICD-10-CM | POA: Diagnosis not present

## 2022-07-04 DIAGNOSIS — I509 Heart failure, unspecified: Secondary | ICD-10-CM | POA: Diagnosis not present

## 2022-07-05 DIAGNOSIS — E1122 Type 2 diabetes mellitus with diabetic chronic kidney disease: Secondary | ICD-10-CM | POA: Diagnosis not present

## 2022-07-05 DIAGNOSIS — D638 Anemia in other chronic diseases classified elsewhere: Secondary | ICD-10-CM | POA: Diagnosis not present

## 2022-07-05 DIAGNOSIS — N189 Chronic kidney disease, unspecified: Secondary | ICD-10-CM | POA: Diagnosis not present

## 2022-07-05 DIAGNOSIS — G4733 Obstructive sleep apnea (adult) (pediatric): Secondary | ICD-10-CM | POA: Diagnosis not present

## 2022-07-05 DIAGNOSIS — E1129 Type 2 diabetes mellitus with other diabetic kidney complication: Secondary | ICD-10-CM | POA: Diagnosis not present

## 2022-07-05 DIAGNOSIS — I5042 Chronic combined systolic (congestive) and diastolic (congestive) heart failure: Secondary | ICD-10-CM | POA: Diagnosis not present

## 2022-07-05 DIAGNOSIS — R809 Proteinuria, unspecified: Secondary | ICD-10-CM | POA: Diagnosis not present

## 2022-07-10 ENCOUNTER — Encounter: Payer: Self-pay | Admitting: Cardiovascular Disease

## 2022-07-10 NOTE — Progress Notes (Unsigned)
Cardiology Office Note:    Date:  07/11/2022   ID:  Jaison, Petraglia [birth year], MRN [id number]  PCP:  Benita Stabile, MD   [facility] Providers Cardiologist:   Lashundra Shiveley     Referring MD: Benita Stabile, MD   No chief complaint on file.   History of Present Illness:    Ronald Heath is a 77 y.o. male with a hx of left bundle branch block, premature ventricular contractions, heart murmur.  We are asked to see him today by Dr. Margo Aye for further evaluation of his heart murmur and left left bundle branch block.  No CP  No hx of exercise  Did accounting work in the past  No yard work , limited because of knee issues  Has seen ortho   Hx of obesity Peak weight was 454 lbs  Now is 389   He had a heart catheterization many years ago.  Heart cath was reportedly normal.   July 11, 2022  Ronald Heath is seen for follow up of his CAD, LBBB , morbid obesity Wt is 357 lbs ( down 32 lbs)   0j ozympic  No CP , no dyspnea. 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   40-45%, mod conc LVH   Hyperlipidemia    Hypertension    Hypothyroidism    Shortness of breath    with exertion   Sleep apnea    pt is supposed to wear CPAP, but doesn't wear it    Past Surgical History:  Procedure Laterality Date   AMPUTATION Left 01/01/2019   Procedure: AMPUTATION LEFT INDEX FINGER;  Surgeon: Mack Hook, MD;  Location: [facility] OR;  Service: Orthopedics;  Laterality: Left;   CATARACT EXTRACTION W/PHACO Right 11/21/2012   Procedure:  CATARACT EXTRACTION PHACO AND INTRAOCULAR LENS PLACEMENT (IOC);  Surgeon: Gemma Payor, MD;  Location: [facility];  Service: Ophthalmology;  Laterality: Right;  CDE:19.67   CATARACT EXTRACTION W/PHACO Left 12/19/2012   Procedure: CATARACT EXTRACTION PHACO AND INTRAOCULAR LENS PLACEMENT (IOC);  Surgeon: Gemma Payor, MD;  Location: [facility];  Service: Ophthalmology;  Laterality: Left;  CDE:8.00   KNEE ARTHROSCOPY Left    ROTATOR CUFF REPAIR Right     Current Medications: Current Meds  Medication Sig   albuterol (VENTOLIN HFA) 108 (90 Base) MCG/ACT inhaler 2 Puff(s) By Mouth Every 4 Hours PRN   aspirin EC 81 MG tablet Take 81 mg by mouth daily.   atorvastatin (LIPITOR) 20 MG tablet Take 20 mg by mouth daily.   azelastine (ASTELIN) 0.1 % nasal spray Place 2 sprays into both nostrils 2 (two) times daily.   carvedilol (COREG) 3.125 MG tablet Take 1 tablet (3.125 mg total) by mouth 2 (two) times daily.   dapagliflozin propanediol (FARXIGA) 10 MG TABS tablet Take 10 mg by mouth daily.   glucose blood test strip 1 each by Other route 4 (four) times daily. Use as instructed   hydrALAZINE (APRESOLINE) 50 MG tablet Take 50 mg by mouth 3 (three) times daily.   insulin  glargine (LANTUS) 100 UNIT/ML injection Inject 50 Units into the skin daily.   levothyroxine (SYNTHROID, LEVOTHROID) 137 MCG tablet Take 137 mcg by mouth daily before breakfast.   metFORMIN (GLUCOPHAGE-XR) 500 MG 24 hr tablet Take 1,000 mg by mouth 2 (two) times daily.   OneTouch Delica Lancets 30G MISC 1 each by Does not apply route 4 (four) times daily.   PARoxetine (PAXIL) 40 MG tablet Take 20 mg by mouth every morning.   pramipexole (MIRAPEX) 1 MG tablet Take 1 mg by mouth at bedtime.   sacubitril-valsartan (ENTRESTO) 97-103 MG Take 1 tablet by mouth 2 (two) times daily.   Semaglutide (OZEMPIC, 1 MG/DOSE, [place]) Inject 1 mg into the skin once a week.   vitamin B-12 (CYANOCOBALAMIN) 1000 MCG tablet Take 1,000 mcg by mouth daily.   [DISCONTINUED]  insulin detemir (LEVEMIR FLEXPEN) 100 UNIT/ML FlexPen Inject 42 Units into the skin at bedtime.   [DISCONTINUED] oxyCODONE (OXY IR/ROXICODONE) 5 MG immediate release tablet Take 5 mg by mouth every 6 (six) hours as needed.     Allergies:   Patient has no known allergies.   Social History   Socioeconomic History   Marital status: Widowed    Spouse name: Not on file   Number of children: Not on file   Years of education: Not on file   Highest education level: Not on file  Occupational History   Not on file  Tobacco Use   Smoking status: Former    Packs/day: 2.00    Years: 15.00    Additional pack years: 0.00    Total pack years: 30.00    Types: Cigarettes   Smokeless tobacco: Never  Vaping Use   Vaping Use: Never used  Substance and Sexual Activity   Alcohol use: Yes    Alcohol/week: 2.0 standard drinks of alcohol    Types: 2 Cans of beer per week   Drug use: No   Sexual activity: Not on file  Other Topics Concern   Not on file  Social History Narrative   Not on file   Social Determinants of Health   Financial Resource Strain: Not on file  Food Insecurity: Not on file  Transportation Needs: Not on file  Physical Activity: Not on file  Stress: Not on file  Social Connections: Not on file     Family History: The patient's family history includes Diabetes Mellitus II in his father; Stroke in his father.  ROS:   Please see the history of present illness.     All other systems reviewed and are negative.  EKGs/Labs/Other Studies Reviewed:    The following studies were reviewed today:   EKG:    July 11, 2022: Normal sinus rhythm at 77.  Left bundle branch block.  No changes from previous EKG.  Recent Labs: 08/11/2021: ALT 18; TSH 1.340 03/03/2022: B Natriuretic Peptide 103.0 05/24/2022: BUN 19; Creatinine, Ser 1.25; Potassium 4.3; Sodium 139  Recent Lipid Panel    Component Value Date/Time   CHOL 91 (L) 08/11/2021 0825   TRIG 59 08/11/2021 0825   HDL 41  08/11/2021 0825   CHOLHDL 2.2 08/11/2021 0825   LDLCALC 36 08/11/2021 0825     Risk Assessment/Calculations:           Physical Exam:    Physical Exam: Blood pressure 130/80, pulse 77, resp. rate (!) 97, height  (1.854 m), weight (!) 357 lb 3.2 oz (162 kg).       GEN:  Well nourished, well developed in  no acute distress HEENT: Normal NECK: No JVD; soft carotid bruits  LYMPHATICS: No lymphadenopathy CARDIAC: RRR , soft systolic murmur  RESPIRATORY:  Clear to auscultation without rales, wheezing or rhonchi  ABDOMEN: Soft, non-tender, non-distended MUSCULOSKELETAL:  No edema; No deformity  SKIN: Warm and dry NEUROLOGIC:  Alert and oriented x 3   ASSESSMENT:    1. Left bundle branch block (LBBB)   2. Heart failure with mildly reduced ejection fraction   3. Morbid obesity     PLAN:       Left bundle branch block:  Heath ,   2.  Cardiac murmur:  has mild MR, AV calcification   3.  Carotid bruits:  mild carotid disease .  Will recheck in a year or 2   4.  Obesity:  encourage more weight loss  5.  Premature ventricular contractions:      Medication Adjustments/Labs and Tests Ordered: Current medicines are reviewed at length with the patient today.  Concerns regarding medicines are outlined above.  Orders Placed This Encounter  Procedures   EKG 12-Lead   No orders of the defined types were placed in this encounter.  **8   Patient Instructions  Medication Instructions:  Your physician recommends that you continue on your current medications as directed. Please refer to the Current Medication list given to you today.  *If you need a refill on your cardiac medications before your next appointment, please call your pharmacy*   Lab Work: NONE If you have labs (blood work) drawn today and your tests are completely normal, you will receive your results only by: MyChart Message (if you have MyChart) OR A paper copy in the mail If you have any lab test  that is abnormal or we need to change your treatment, we will call you to review the results.   Testing/Procedures: NONE   Follow-Up: At [facility], you and your health needs are our priority.  As part of our continuing mission to provide you with exceptional heart care, we have created designated Provider Care Teams.  These Care Teams include your primary Cardiologist (physician) and Advanced Practice Providers (APPs -  Physician Assistants and Nurse Practitioners) who all work together to provide you with the care you need, when you need it.  We recommend signing up for the patient portal called "MyChart".  Sign up information is provided on this After Visit Summary.  MyChart is used to connect with patients for Virtual Visits (Telemedicine).  Patients are able to view lab/test results, encounter notes, upcoming appointments, etc.  Non-urgent messages can be sent to your provider as well.   To learn more about what you can do with MyChart, go to [email or web address].    Your next appointment:   1 year(s)  Provider:   Kristeen Miss, MD        Signed, Kristeen Miss, MD  07/11/2022 1:53 PM    [facility]

## 2022-07-11 ENCOUNTER — Ambulatory Visit: Payer: PPO | Attending: Cardiovascular Disease | Admitting: Cardiovascular Disease

## 2022-07-11 ENCOUNTER — Encounter: Payer: Self-pay | Admitting: Cardiovascular Disease

## 2022-07-11 VITALS — BP 130/80 | HR 77 | Resp 97 | Ht 73.0 in | Wt 357.2 lb

## 2022-07-11 DIAGNOSIS — I5022 Chronic systolic (congestive) heart failure: Secondary | ICD-10-CM | POA: Diagnosis not present

## 2022-07-11 DIAGNOSIS — I447 Left bundle-branch block, unspecified: Secondary | ICD-10-CM

## 2022-07-11 NOTE — Patient Instructions (Signed)
Medication Instructions:  Your physician recommends that you continue on your current medications as directed. Please refer to the Current Medication list given to you today.  *If you need a refill on your cardiac medications before your next appointment, please call your pharmacy*   Lab Work: NONE If you have labs (blood work) drawn today and your tests are completely normal, you will receive your results only by: MyChart Message (if you have MyChart) OR A paper copy in the mail If you have any lab test that is abnormal or we need to change your treatment, we will call you to review the results.   Testing/Procedures: NONE   Follow-Up: At [facility], you and your health needs are our priority.  As part of our continuing mission to provide you with exceptional heart care, we have created designated Provider Care Teams.  These Care Teams include your primary Cardiologist (physician) and Advanced Practice Providers (APPs -  Physician Assistants and Nurse Practitioners) who all work together to provide you with the care you need, when you need it.  We recommend signing up for the patient portal called "MyChart".  Sign up information is provided on this After Visit Summary.  MyChart is used to connect with patients for Virtual Visits (Telemedicine).  Patients are able to view lab/test results, encounter notes, upcoming appointments, etc.  Non-urgent messages can be sent to your provider as well.   To learn more about what you can do with MyChart, go to [email or web address].    Your next appointment:   1 year(s)  Provider:   Philip Nahser, MD      

## 2022-07-13 DIAGNOSIS — B351 Tinea unguium: Secondary | ICD-10-CM | POA: Diagnosis not present

## 2022-07-13 DIAGNOSIS — E1142 Type 2 diabetes mellitus with diabetic polyneuropathy: Secondary | ICD-10-CM | POA: Diagnosis not present

## 2022-08-08 DIAGNOSIS — G4733 Obstructive sleep apnea (adult) (pediatric): Secondary | ICD-10-CM | POA: Diagnosis not present

## 2022-08-08 DIAGNOSIS — Z79899 Other long term (current) drug therapy: Secondary | ICD-10-CM | POA: Diagnosis not present

## 2022-08-08 DIAGNOSIS — R809 Proteinuria, unspecified: Secondary | ICD-10-CM | POA: Diagnosis not present

## 2022-08-08 DIAGNOSIS — E1122 Type 2 diabetes mellitus with diabetic chronic kidney disease: Secondary | ICD-10-CM | POA: Diagnosis not present

## 2022-08-08 DIAGNOSIS — I5032 Chronic diastolic (congestive) heart failure: Secondary | ICD-10-CM | POA: Diagnosis not present

## 2022-08-08 DIAGNOSIS — N189 Chronic kidney disease, unspecified: Secondary | ICD-10-CM | POA: Diagnosis not present

## 2022-08-08 DIAGNOSIS — D638 Anemia in other chronic diseases classified elsewhere: Secondary | ICD-10-CM | POA: Diagnosis not present

## 2022-08-08 DIAGNOSIS — N1831 Chronic kidney disease, stage 3a: Secondary | ICD-10-CM | POA: Diagnosis not present

## 2022-08-16 ENCOUNTER — Ambulatory Visit: Payer: PPO | Admitting: Nurse Practitioner

## 2022-08-21 ENCOUNTER — Ambulatory Visit (INDEPENDENT_AMBULATORY_CARE_PROVIDER_SITE_OTHER): Payer: PPO | Admitting: Nurse Practitioner

## 2022-08-21 ENCOUNTER — Encounter: Payer: Self-pay | Admitting: Nurse Practitioner

## 2022-08-21 VITALS — BP 107/62 | HR 75 | Ht 73.0 in | Wt 355.0 lb

## 2022-08-21 DIAGNOSIS — Z7985 Long-term (current) use of injectable non-insulin antidiabetic drugs: Secondary | ICD-10-CM | POA: Diagnosis not present

## 2022-08-21 DIAGNOSIS — N182 Chronic kidney disease, stage 2 (mild): Secondary | ICD-10-CM

## 2022-08-21 DIAGNOSIS — Z6841 Body Mass Index (BMI) 40.0 and over, adult: Secondary | ICD-10-CM | POA: Diagnosis not present

## 2022-08-21 DIAGNOSIS — Z794 Long term (current) use of insulin: Secondary | ICD-10-CM | POA: Diagnosis not present

## 2022-08-21 DIAGNOSIS — E782 Mixed hyperlipidemia: Secondary | ICD-10-CM

## 2022-08-21 DIAGNOSIS — E039 Hypothyroidism, unspecified: Secondary | ICD-10-CM | POA: Diagnosis not present

## 2022-08-21 DIAGNOSIS — E1122 Type 2 diabetes mellitus with diabetic chronic kidney disease: Secondary | ICD-10-CM

## 2022-08-21 DIAGNOSIS — Z7984 Long term (current) use of oral hypoglycemic drugs: Secondary | ICD-10-CM | POA: Diagnosis not present

## 2022-08-21 DIAGNOSIS — I10 Essential (primary) hypertension: Secondary | ICD-10-CM | POA: Diagnosis not present

## 2022-08-21 DIAGNOSIS — E119 Type 2 diabetes mellitus without complications: Secondary | ICD-10-CM

## 2022-08-21 NOTE — Progress Notes (Signed)
Endocrinology Follow Up Note       08/21/2022, 1:20 PM   Subjective:    Patient ID: Ronald Heath, male    DOB: 05-11-1945.  Ronald Heath is being seen in follow up after being seen in consultation for management of currently uncontrolled symptomatic diabetes requested by  Ronald Stabile, MD.  He is transferring care from his previous Endocrinologist Dr. Leslie Dales who retired.   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   40-45%, mod conc LVH   Hyperlipidemia    Hypertension    Hypothyroidism    Shortness of breath    with exertion   Sleep apnea    pt is supposed to wear CPAP, but doesn't wear it    Past Surgical History:  Procedure Laterality Date   AMPUTATION Left 01/01/2019   Procedure: AMPUTATION LEFT INDEX FINGER;  Surgeon: Mack Hook, MD;  Location: [facility] OR;  Service: Orthopedics;  Laterality: Left;   CATARACT EXTRACTION W/PHACO Right 11/21/2012   Procedure: CATARACT EXTRACTION PHACO AND INTRAOCULAR LENS PLACEMENT (IOC);  Surgeon: Gemma Payor, MD;  Location: [facility] ORS;  Service: Ophthalmology;  Laterality: Right;  CDE:19.67   CATARACT EXTRACTION W/PHACO Left 12/19/2012   Procedure: CATARACT EXTRACTION PHACO AND INTRAOCULAR LENS PLACEMENT (IOC);  Surgeon: Gemma Payor, MD;  Location: [facility];  Service: Ophthalmology;  Laterality: Left;  CDE:8.00   KNEE ARTHROSCOPY Left    ROTATOR CUFF REPAIR Right     Social History   Socioeconomic History   Marital status: Widowed    Spouse name: Not on file   Number of children: Not on file    Years of education: Not on file   Highest education level: Not on file  Occupational History   Not on file  Tobacco Use   Smoking status: Former    Packs/day: 2.00    Years: 15.00    Additional pack years: 0.00    Total pack years: 30.00    Types: Cigarettes   Smokeless tobacco: Never  Vaping Use   Vaping Use: Never used  Substance and Sexual Activity   Alcohol use: Yes    Alcohol/week: 2.0 standard drinks of alcohol    Types: 2 Cans of beer per week   Drug use: No   Sexual activity: Not on file  Other Topics Concern   Not on file  Social History Narrative   Not on file   Social Determinants of Health   Financial Resource Strain: Not on file  Food Insecurity: Not on file  Transportation Needs: Not on file  Physical Activity: Not on file  Stress: Not on file  Social Connections: Not on file    Family History  Problem Relation Age of Onset   Diabetes Mellitus II Father    Stroke Father     Outpatient Encounter Medications  as of 08/21/2022  Medication Sig   albuterol (VENTOLIN HFA) 108 (90 Base) MCG/ACT inhaler 2 Puff(s) By Mouth Every 4 Hours PRN   aspirin EC 81 MG tablet Take 81 mg by mouth daily.   atorvastatin (LIPITOR) 20 MG tablet Take 20 mg by mouth daily.   azelastine (ASTELIN) 0.1 % nasal spray Place 2 sprays into both nostrils 2 (two) times daily.   carvedilol (COREG) 3.125 MG tablet Take 1 tablet (3.125 mg total) by mouth 2 (two) times daily.   dapagliflozin propanediol (FARXIGA) 10 MG TABS tablet Take 10 mg by mouth daily.   glucose blood test strip 1 each by Other route 4 (four) times daily. Use as instructed   hydrALAZINE (APRESOLINE) 50 MG tablet Take 50 mg by mouth 3 (three) times daily.   insulin glargine (LANTUS) 100 UNIT/ML injection Inject 50 Units into the skin daily.   levothyroxine (SYNTHROID, LEVOTHROID) 137 MCG tablet Take 137 mcg by mouth daily before breakfast.   metFORMIN (GLUCOPHAGE-XR) 500 MG 24 hr tablet Take 500 mg by mouth 2 (two)  times daily with a meal.   OneTouch Delica Lancets 30G MISC 1 each by Does not apply route 4 (four) times daily.   PARoxetine (PAXIL) 40 MG tablet Take 20 mg by mouth every morning.   pramipexole (MIRAPEX) 1 MG tablet Take 1 mg by mouth at bedtime.   sacubitril-valsartan (ENTRESTO) 97-103 MG Take 1 tablet by mouth 2 (two) times daily.   Semaglutide (OZEMPIC, 1 MG/DOSE, Alex) Inject 1 mg into the skin once a week.   vitamin B-12 (CYANOCOBALAMIN) 1000 MCG tablet Take 1,000 mcg by mouth daily.   No facility-administered encounter medications on file as of 08/21/2022.    ALLERGIES: No Known Allergies  VACCINATION STATUS: Immunization History  Administered Date(s) Administered   Influenza, High Dose Seasonal PF 01/16/2018   Influenza-Unspecified 12/19/2019   Moderna Sars-Covid-2 Vaccination 05/31/2019, 06/21/2019, 02/19/2020   Tdap 12/30/2018    Diabetes He presents for his follow-up diabetic visit. He has type 2 diabetes mellitus. Onset time: Diagnosed at approx age of 7. His disease course has been fluctuating. Hypoglycemia symptoms include sweats. Pertinent negatives for hypoglycemia include no nervousness/anxiousness or tremors. Associated symptoms include foot paresthesias and weight loss. Pertinent negatives for diabetes include no fatigue, no polydipsia and no polyuria. There are no hypoglycemic complications. Symptoms are stable. Diabetic complications include nephropathy, peripheral neuropathy and retinopathy. Risk factors for coronary artery disease include diabetes mellitus, dyslipidemia, family history, hypertension, male sex, obesity and sedentary lifestyle. Current diabetic treatment includes intensive insulin program and oral agent (dual therapy) (and Ozempic). He is compliant with treatment most of the time. His weight is decreasing steadily. He is following a generally healthy diet. When asked about meal planning, he reported none. He has not had a previous visit with a dietitian. He  never participates in exercise. His home blood glucose trend is decreasing steadily. His overall blood glucose range is 70-90 mg/dl. (He presents today with his meter, no logs, showing tightening fasting glycemic profile.  His previsit A1c from 5/21 was 7.4%, improving from last visit of 7.9%.  He denies any significant hypoglycemia.  Analysis of his meter shows 7-day average of 89, 14-day average of 92, 30-day average of 109, 90-day average of 105. ) An ACE inhibitor/angiotensin II receptor blocker is being taken. He sees a podiatrist.Eye exam is current.  Hyperlipidemia This is a chronic problem. The current episode started more than 1 year ago. The problem is controlled. Recent lipid tests  were reviewed and are normal. Exacerbating diseases include chronic renal disease, diabetes, hypothyroidism and obesity. Factors aggravating his hyperlipidemia include fatty foods and thiazides. Current antihyperlipidemic treatment includes statins. The current treatment provides significant improvement of lipids. Compliance problems include adherence to diet and adherence to exercise.  Risk factors for coronary artery disease include diabetes mellitus, dyslipidemia, hypertension, male sex, obesity and a sedentary lifestyle.  Hypertension This is a chronic problem. The current episode started more than 1 year ago. The problem has been gradually improving since onset. The problem is controlled. Associated symptoms include peripheral edema and sweats. Pertinent negatives include no palpitations. Agents associated with hypertension include thyroid hormones. Risk factors for coronary artery disease include diabetes mellitus, dyslipidemia, family history, obesity, male gender and sedentary lifestyle. Past treatments include calcium channel blockers, diuretics and angiotensin blockers. The current treatment provides mild improvement. Compliance problems include diet and exercise.  Hypertensive end-organ damage includes kidney  disease and retinopathy. Identifiable causes of hypertension include chronic renal disease and a thyroid problem.  Thyroid Problem Presents for follow-up visit. Symptoms include weight loss. Patient reports no anxiety, cold intolerance, constipation, depressed mood, fatigue, heat intolerance, leg swelling, palpitations, tremors or weight gain. The symptoms have been stable. His past medical history is significant for diabetes and hyperlipidemia.     Review of systems  Constitutional: + steadily decreasing body weight, current Body mass index is 46.84 kg/m.,  no subjective hyperthermia, no subjective hypothermia Eyes: no blurry vision, no xerophthalmia ENT: no sore throat, no nodules palpated in throat, no dysphagia/odynophagia, no hoarseness Cardiovascular: no chest pain, no shortness of breath, no palpitations, + ankle swelling Respiratory: no cough, no shortness of breath Gastrointestinal: no nausea/vomiting/diarrhea Musculoskeletal: right knee pain (needs TKR) Skin: no rashes, no hyperemia Neurological: no tremors, no numbness, no tingling, no dizziness Psychiatric: no depression, no anxiety  Objective:     BP 107/62 (BP Location: Left Arm, Patient Position: Sitting, Cuff Size: Large)   Pulse 75   Ht 6\' 1"  (1.854 m)   Wt (!) 355 lb (161 kg)   BMI 46.84 kg/m   Wt Readings from Last 3 Encounters:  08/21/22 (!) 355 lb (161 kg)  07/11/22 (!) 357 lb 3.2 oz (162 kg)  04/17/22 (!) 360 lb 6.4 oz (163.5 kg)     BP Readings from Last 3 Encounters:  08/21/22 107/62  07/11/22 130/80  04/17/22 (!) 148/78      Physical Exam- Limited  Constitutional:  Body mass index is 46.84 kg/m. , not in acute distress, normal state of mind Eyes:  EOMI, no exophthalmos Musculoskeletal: no gross deformities, strength intact in all four extremities, no gross restriction of joint movements Skin:  no rashes, no hyperemia Neurological: no tremor with outstretched hands   Diabetic Foot Exam -  Simple   No data filed      CMP ( most recent) CMP     Component Value Date/Time   NA 139 05/24/2022 1226   NA 138 08/11/2021 0825   K 4.3 05/24/2022 1226   CL 107 05/24/2022 1226   CO2 25 05/24/2022 1226   GLUCOSE 131 (H) 05/24/2022 1226   BUN 19 05/24/2022 1226   BUN 21 08/11/2021 0825   CREATININE 1.25 (H) 05/24/2022 1226   CREATININE 1.19 (H) 11/03/2019 1035   CALCIUM 8.6 (L) 05/24/2022 1226   PROT 6.3 08/11/2021 0825   ALBUMIN 3.7 08/11/2021 0825   AST 18 08/11/2021 0825   ALT 18 08/11/2021 0825   ALKPHOS 75 08/11/2021 0825  BILITOT 0.3 08/11/2021 0825   GFRNONAA >60 05/24/2022 1226   GFRNONAA 60 11/03/2019 1035   GFRAA 70 11/03/2019 1035     Diabetic Labs (most recent): Lab Results  Component Value Date   HGBA1C 7.0 (A) 12/13/2021   HGBA1C 7.6 09/08/2021   HGBA1C 6.9 05/16/2021     Lipid Panel ( most recent) Lipid Panel     Component Value Date/Time   CHOL 91 (L) 08/11/2021 0825   TRIG 59 08/11/2021 0825   HDL 41 08/11/2021 0825   CHOLHDL 2.2 08/11/2021 0825   LDLCALC 36 08/11/2021 0825   LABVLDL 14 08/11/2021 0825      Lab Results  Component Value Date   TSH 1.340 08/11/2021   TSH 1.440 01/03/2021   FREET4 1.46 08/11/2021   FREET4 1.27 01/03/2021           Assessment & Plan:   1) Diabetes mellitus without complication (HCC)  He presents today with his meter, no logs, showing tightening fasting glycemic profile.  His previsit A1c from 5/21 was 7.4%, improving from last visit of 7.9%.  He denies any significant hypoglycemia.  Analysis of his meter shows 7-day average of 89, 14-day average of 92, 30-day average of 109, 90-day average of 105.  - Ronald Heath has currently uncontrolled symptomatic type 2 DM since 77 years of age.  -Recent labs reviewed.  - I had a long discussion with him about the progressive nature of diabetes and the pathology behind its complications. 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 These are all discussed in detail with him.  - Nutritional counseling repeated at each appointment due to patients tendency to fall back in to old habits.  - The patient admits there is a room for improvement in their diet and drink choices. -  Suggestion is made for the patient to avoid simple carbohydrates from their diet including Cakes, Sweet Desserts / Pastries, Ice Cream, Soda (diet and regular), Sweet Tea, Candies, Chips, Cookies, Sweet Pastries, Store Bought Juices, Alcohol in Excess of 1-2 drinks a day, Artificial Sweeteners, Coffee Creamer, and "Sugar-free" Products. This will help patient to have stable blood glucose profile and potentially avoid unintended weight gain.   - I encouraged the patient to switch to unprocessed or minimally processed complex starch and increased protein intake (animal or plant source), fruits, and vegetables.   - Patient is advised to stick to a routine mealtimes to eat 3 meals a day and avoid unnecessary snacks (to snack only to correct hypoglycemia).  The following Lifestyle Medicine recommendations according to American College of Lifestyle Medicine  [facility]) were discussed and and offered to patient and he  agrees to start the journey:  A. Whole Foods, Plant-Based Nutrition comprising of fruits and vegetables, plant-based proteins, whole-grain carbohydrates was discussed in detail with the patient.   A list for source of those nutrients were also provided to the patient.  Patient will use only water or unsweetened tea for hydration. B.  The need to stay away from risky substances including alcohol, smoking; obtaining 7 to 9 hours of restorative sleep, at least 150 minutes of moderate intensity exercise weekly, the importance of healthy social connections,  and stress management techniques were discussed. C.  A full color page of  Calorie  density of various food groups per pound showing examples of each food groups  was provided to the patient.  - I have approached him with the following individualized plan to manage his diabetes and patient agrees:   -He is advised to continue his Lantus 45 units SQ nightly and Ozempic 1 mg SQ weekly.  He can continue Farxiga 10 mg po daily, prescribed by nephrology (we did go over s/s to watch for including yeast formation in folds).  I did lower his Metformin to 500 mg BID today to see if it helps improve kidney function.  I advised him to call his PCP office regarding his PAP for Ozempic about increasing to the 2 mg dose once he is finished with his current supply so that we can continue to lower his insulin burden in the future.  -he is encouraged to continue monitoring glucose 4 times daily, before meals and before bed, and to call the clinic if he has readings less than 70 or greater than 200 for 3 tests in a row.  He could greatly benefit from CGM device, especially since he has random hypoglycemia.  He received his CGM from [employer], but has not started wearing it.  I encouraged him to bring it here for assistance in applying.  - he is warned not to take insulin without proper monitoring per orders.  - Adjustment parameters are given to him for hypo and hyperglycemia in writing.  - Specific targets for  A1c; LDL, HDL, and Triglycerides were discussed with the patient.  2) Blood Pressure /Hypertension: -His blood pressure is not controlled to target but is stable.  he is advised to continue his current medications including Norvasc 10 mg p.o. daily with breakfast, Hydralazine 50 mg po TID, and Olmesartan 40 mg po daily.   3) Lipids/Hyperlipidemia:    Review of his recent lipid panel from 02/23/22 showed controlled LDL at 38.  He is advised to continue Lipitor 20 mg p.o. daily at bedtime.  Side effects and precautions discussed with him.  4)  Weight/Diet:  his Body mass index is 46.84 kg/m.   -  clearly complicating his diabetes care.  he is a candidate for weight loss. I discussed with him the fact that loss of 5 - 10% of his  current body weight will have the most impact on his diabetes management.  Exercise, and detailed carbohydrates information provided  -  detailed on discharge instructions.    5) Hypothyroidism- unspecified There are no recent TFTs to review.  He is advised to continue Levothyroxine 137 mcg p.o. daily before breakfast.  Will recheck TFTs prior to next visit.  - The correct intake of thyroid hormone (Levothyroxine, Synthroid), is on empty stomach first thing in the morning, with water, separated by at least 30 minutes from breakfast and other medications,  and separated by more than 4 hours from calcium, iron, multivitamins, acid reflux medications (PPIs).  - This medication is a life-long medication and will be needed to correct thyroid hormone imbalances for the rest of your life.  The dose may change from time to time, based on thyroid blood work.  - It is extremely important to be consistent taking this medication, near the same time each morning.  -AVOID TAKING PRODUCTS CONTAINING BIOTIN (commonly found in Hair, Skin, Nails vitamins) AS IT INTERFERES WITH THE VALIDITY OF THYROID FUNCTION BLOOD TESTS.  6) Chronic Care/Health Maintenance: -he is on ACEI/ARB and Statin medications and is encouraged to initiate and continue to follow up with Ophthalmology, Dentist, Podiatrist at least yearly or according to recommendations, and advised  to stay away from smoking. I have recommended yearly flu vaccine and pneumonia vaccine at least every 5 years; moderate intensity exercise for up to 150 minutes weekly; and sleep for at least 7 hours a day.  - he is advised to maintain close follow up with Ronald Stabile, MD for primary care needs, as well as his other providers for optimal and coordinated care.     I spent  31  minutes in the care of the patient today including  review of labs from CMP, Lipids, Thyroid Function, Hematology (current and previous including abstractions from other facilities); face-to-face time discussing  his blood glucose readings/logs, discussing hypoglycemia and hyperglycemia episodes and symptoms, medications doses, his options of short and long term treatment based on the latest standards of care / guidelines;  discussion about incorporating lifestyle medicine;  and documenting the encounter. Risk reduction counseling performed per USPSTF guidelines to reduce obesity and cardiovascular risk factors.     Please refer to Patient Instructions for Blood Glucose Monitoring and Insulin/Medications Dosing Guide"  in media tab for additional information. Please  also refer to " Patient Self Inventory" in the Media  tab for reviewed elements of pertinent patient history.  Ronald Heath participated in the discussions, expressed understanding, and voiced agreement with the above plans.  All questions were answered to his satisfaction. he is encouraged to contact clinic should he have any questions or concerns prior to his return visit.   Follow up plan: - Return in about 4 months (around 12/21/2022) for Diabetes F/U with A1c in office, No previsit labs, Bring meter and logs.   Ronny Bacon, [facility] [address] [place], [place] [address] Phone: [phone] Fax: [phone]  08/21/2022, 1:20 PM

## 2022-09-28 DIAGNOSIS — B351 Tinea unguium: Secondary | ICD-10-CM | POA: Diagnosis not present

## 2022-09-28 DIAGNOSIS — E1142 Type 2 diabetes mellitus with diabetic polyneuropathy: Secondary | ICD-10-CM | POA: Diagnosis not present

## 2022-10-02 DIAGNOSIS — Z1211 Encounter for screening for malignant neoplasm of colon: Secondary | ICD-10-CM | POA: Diagnosis not present

## 2022-10-04 DIAGNOSIS — G4733 Obstructive sleep apnea (adult) (pediatric): Secondary | ICD-10-CM | POA: Diagnosis not present

## 2022-10-11 DIAGNOSIS — E1165 Type 2 diabetes mellitus with hyperglycemia: Secondary | ICD-10-CM | POA: Diagnosis not present

## 2022-10-11 DIAGNOSIS — D519 Vitamin B12 deficiency anemia, unspecified: Secondary | ICD-10-CM | POA: Diagnosis not present

## 2022-10-11 DIAGNOSIS — I10 Essential (primary) hypertension: Secondary | ICD-10-CM | POA: Diagnosis not present

## 2022-10-17 DIAGNOSIS — Z23 Encounter for immunization: Secondary | ICD-10-CM | POA: Diagnosis not present

## 2022-10-17 DIAGNOSIS — I10 Essential (primary) hypertension: Secondary | ICD-10-CM | POA: Diagnosis not present

## 2022-10-17 DIAGNOSIS — R809 Proteinuria, unspecified: Secondary | ICD-10-CM | POA: Diagnosis not present

## 2022-10-17 DIAGNOSIS — I6529 Occlusion and stenosis of unspecified carotid artery: Secondary | ICD-10-CM | POA: Diagnosis not present

## 2022-10-17 DIAGNOSIS — E782 Mixed hyperlipidemia: Secondary | ICD-10-CM | POA: Diagnosis not present

## 2022-10-17 DIAGNOSIS — I130 Hypertensive heart and chronic kidney disease with heart failure and stage 1 through stage 4 chronic kidney disease, or unspecified chronic kidney disease: Secondary | ICD-10-CM | POA: Diagnosis not present

## 2022-10-17 DIAGNOSIS — E039 Hypothyroidism, unspecified: Secondary | ICD-10-CM | POA: Diagnosis not present

## 2022-10-17 DIAGNOSIS — E1165 Type 2 diabetes mellitus with hyperglycemia: Secondary | ICD-10-CM | POA: Diagnosis not present

## 2022-10-17 DIAGNOSIS — R0600 Dyspnea, unspecified: Secondary | ICD-10-CM | POA: Diagnosis not present

## 2022-10-17 DIAGNOSIS — F339 Major depressive disorder, recurrent, unspecified: Secondary | ICD-10-CM | POA: Diagnosis not present

## 2022-11-20 ENCOUNTER — Other Ambulatory Visit: Payer: Self-pay | Admitting: Cardiovascular Disease

## 2022-11-22 MED ORDER — SACUBITRIL-VALSARTAN 97-103 MG PO TABS: 1.0000 | ORAL_TABLET | Freq: Two times a day (BID) | ORAL | 7 refills | Status: DC

## 2022-12-07 DIAGNOSIS — B351 Tinea unguium: Secondary | ICD-10-CM | POA: Diagnosis not present

## 2022-12-07 DIAGNOSIS — E1142 Type 2 diabetes mellitus with diabetic polyneuropathy: Secondary | ICD-10-CM | POA: Diagnosis not present

## 2022-12-14 DIAGNOSIS — E039 Hypothyroidism, unspecified: Secondary | ICD-10-CM | POA: Diagnosis not present

## 2022-12-14 DIAGNOSIS — E119 Type 2 diabetes mellitus without complications: Secondary | ICD-10-CM | POA: Diagnosis not present

## 2022-12-15 LAB — COMPREHENSIVE METABOLIC PANEL
ALT: 14 [IU]/L (ref 0–44)
AST: 17 [IU]/L (ref 0–40)
Albumin: 3.7 g/dL — ABNORMAL LOW (ref 3.8–4.8)
Alkaline Phosphatase: 71 [IU]/L (ref 44–121)
BUN/Creatinine Ratio: 15 (ref 10–24)
BUN: 21 mg/dL (ref 8–27)
Bilirubin Total: 0.3 mg/dL (ref 0.0–1.2)
CO2: 24 mmol/L (ref 20–29)
Calcium: 9.4 mg/dL (ref 8.6–10.2)
Chloride: 103 mmol/L (ref 96–106)
Creatinine, Ser: 1.37 mg/dL — ABNORMAL HIGH (ref 0.76–1.27)
Globulin, Total: 2.8 g/dL (ref 1.5–4.5)
Glucose: 96 mg/dL (ref 70–99)
Potassium: 5.2 mmol/L (ref 3.5–5.2)
Sodium: 139 mmol/L (ref 134–144)
Total Protein: 6.5 g/dL (ref 6.0–8.5)
eGFR: 53 mL/min/{1.73_m2} — ABNORMAL LOW (ref >59)

## 2022-12-15 LAB — TSH: TSH: 1.2 u[IU]/mL (ref 0.450–4.500)

## 2022-12-15 LAB — T4, FREE: Free T4: 1.51 ng/dL (ref 0.82–1.77)

## 2022-12-21 ENCOUNTER — Ambulatory Visit: Payer: PPO | Admitting: Nurse Practitioner

## 2022-12-21 ENCOUNTER — Encounter: Payer: Self-pay | Admitting: Nurse Practitioner

## 2022-12-21 VITALS — BP 142/78 | HR 64 | Ht 73.0 in | Wt 351.6 lb

## 2022-12-21 DIAGNOSIS — Z794 Long term (current) use of insulin: Secondary | ICD-10-CM | POA: Diagnosis not present

## 2022-12-21 DIAGNOSIS — E782 Mixed hyperlipidemia: Secondary | ICD-10-CM

## 2022-12-21 DIAGNOSIS — Z7984 Long term (current) use of oral hypoglycemic drugs: Secondary | ICD-10-CM

## 2022-12-21 DIAGNOSIS — Z7985 Long-term (current) use of injectable non-insulin antidiabetic drugs: Secondary | ICD-10-CM

## 2022-12-21 DIAGNOSIS — E039 Hypothyroidism, unspecified: Secondary | ICD-10-CM

## 2022-12-21 DIAGNOSIS — E119 Type 2 diabetes mellitus without complications: Secondary | ICD-10-CM | POA: Diagnosis not present

## 2022-12-21 DIAGNOSIS — I10 Essential (primary) hypertension: Secondary | ICD-10-CM | POA: Diagnosis not present

## 2022-12-21 LAB — POCT GLYCOSYLATED HEMOGLOBIN (HGB A1C): Hemoglobin A1C: 7.2 % — AB (ref 4.0–5.6)

## 2022-12-21 NOTE — Patient Instructions (Signed)

## 2022-12-21 NOTE — Progress Notes (Signed)
Endocrinology Follow Up Note       12/21/2022, 2:04 PM   Subjective:    Patient ID: Ronald Heath, male    DOB: 06/18/1945.  Ronald Heath is being seen in follow up after being seen in consultation for management of currently uncontrolled symptomatic diabetes requested by  Benita Stabile, MD.  He is transferring care from his previous Endocrinologist Dr. Leslie Dales who retired.   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   40-45%, mod conc LVH   Hyperlipidemia    Hypertension    Hypothyroidism    Shortness of breath    with exertion   Sleep apnea    pt is supposed to wear CPAP, but doesn't wear it    Past Surgical History:  Procedure Laterality Date   AMPUTATION Left 01/01/2019   Procedure: AMPUTATION LEFT INDEX FINGER;  Surgeon: Mack Hook, MD;  Location: [facility] OR;  Service: Orthopedics;  Laterality: Left;   CATARACT EXTRACTION W/PHACO Right 11/21/2012   Procedure: CATARACT EXTRACTION PHACO AND INTRAOCULAR LENS PLACEMENT (IOC);  Surgeon: Gemma Payor, MD;  Location: [facility] ORS;  Service: Ophthalmology;  Laterality: Right;  CDE:19.67   CATARACT EXTRACTION W/PHACO Left 12/19/2012   Procedure: CATARACT EXTRACTION PHACO AND INTRAOCULAR LENS PLACEMENT (IOC);  Surgeon: Gemma Payor, MD;  Location: [facility];  Service: Ophthalmology;  Laterality: Left;  CDE:8.00   KNEE ARTHROSCOPY Left    ROTATOR CUFF REPAIR Right     Social History   Socioeconomic History   Marital status: Widowed    Spouse name: Not on file   Number of children: Not on file    Years of education: Not on file   Highest education level: Not on file  Occupational History   Not on file  Tobacco Use   Smoking status: Former    Current packs/day: 2.00    Average packs/day: 2.0 packs/day for 15.0 years (30.0 ttl pk-yrs)    Types: Cigarettes   Smokeless tobacco: Never  Vaping Use   Vaping status: Never Used  Substance and Sexual Activity   Alcohol use: Yes    Alcohol/week: 2.0 standard drinks of alcohol    Types: 2 Cans of beer per week   Drug use: No   Sexual activity: Not on file  Other Topics Concern   Not on file  Social History Narrative   Not on file   Social Determinants of Health   Financial Resource Strain: Not on file  Food Insecurity: Not on file  Transportation Needs: Not on file  Physical Activity: Not on file  Stress: Not on file  Social Connections: Not on file    Family History  Problem Relation Age of Onset   Diabetes Mellitus II Father    Stroke Father     Outpatient Encounter Medications as of 12/21/2022  Medication  Sig   albuterol (VENTOLIN HFA) 108 (90 Base) MCG/ACT inhaler 2 Puff(s) By Mouth Every 4 Hours PRN   aspirin EC 81 MG tablet Take 81 mg by mouth daily.   atorvastatin (LIPITOR) 20 MG tablet Take 20 mg by mouth daily.   azelastine (ASTELIN) 0.1 % nasal spray Place 2 sprays into both nostrils 2 (two) times daily.   carvedilol (COREG) 3.125 MG tablet Take 1 tablet (3.125 mg total) by mouth 2 (two) times daily.   dapagliflozin propanediol (FARXIGA) 10 MG TABS tablet Take 10 mg by mouth daily.   glucose blood test strip 1 each by Other route 4 (four) times daily. Use as instructed   hydrALAZINE (APRESOLINE) 50 MG tablet Take 50 mg by mouth 3 (three) times daily.   insulin glargine (LANTUS) 100 UNIT/ML injection Inject 50 Units into the skin daily.   levothyroxine (SYNTHROID, LEVOTHROID) 137 MCG tablet Take 137 mcg by mouth daily before breakfast.   metFORMIN (GLUCOPHAGE-XR) 500 MG 24 hr tablet Take 500 mg by mouth 2  (two) times daily with a meal.   OneTouch Delica Lancets 30G MISC 1 each by Does not apply route 4 (four) times daily.   PARoxetine (PAXIL) 40 MG tablet Take 20 mg by mouth every morning.   pramipexole (MIRAPEX) 1 MG tablet Take 1 mg by mouth at bedtime.   sacubitril-valsartan (ENTRESTO) 97-103 MG Take 1 tablet by mouth 2 (two) times daily.   Semaglutide (OZEMPIC, 1 MG/DOSE, [place]) Inject 1 mg into the skin once a week.   vitamin B-12 (CYANOCOBALAMIN) 1000 MCG tablet Take 1,000 mcg by mouth daily.   No facility-administered encounter medications on file as of 12/21/2022.    ALLERGIES: No Known Allergies  VACCINATION STATUS: Immunization History  Administered Date(s) Administered   Influenza, High Dose Seasonal PF 01/16/2018   Influenza-Unspecified 12/19/2019   Moderna Sars-Covid-2 Vaccination 05/31/2019, 06/21/2019, 02/19/2020   Tdap 12/30/2018    Diabetes He presents for his follow-up diabetic visit. He has type 2 diabetes mellitus. Onset time: Diagnosed at approx age of 59. His disease course has been improving. There are no hypoglycemic associated symptoms. Pertinent negatives for hypoglycemia include no nervousness/anxiousness or tremors. Associated symptoms include foot paresthesias and weight loss. Pertinent negatives for diabetes include no fatigue, no polydipsia and no polyuria. There are no hypoglycemic complications. Symptoms are stable. Diabetic complications include nephropathy, peripheral neuropathy and retinopathy. Risk factors for coronary artery disease include diabetes mellitus, dyslipidemia, family history, hypertension, male sex, obesity and sedentary lifestyle. Current diabetic treatment includes intensive insulin program and oral agent (dual therapy) (and Ozempic). He is compliant with treatment most of the time. His weight is decreasing steadily. He is following a generally healthy diet. When asked about meal planning, he reported none. He has not had a previous visit with a  dietitian. He never participates in exercise. His home blood glucose trend is fluctuating minimally. His breakfast blood glucose range is generally 110-130 mg/dl. (He presents today with his meter, no logs, showing at goal fasting glycemic profile.  His POCT A1c today is 7.2%, improving from last visit of 7.4%.  He denies any significant hypoglycemia although there was a reading of 58 in his meter, he cannot remember the specifics surrounding that event.  Analysis of his meter shows 7-day average of 125, 14-day average of 123, 30-day average of 120, 90-day average of 118. ) An ACE inhibitor/angiotensin II receptor blocker is being taken. He sees a podiatrist.Eye exam is current.  Hyperlipidemia This is a chronic problem.  The current episode started more than 1 year ago. The problem is controlled. Recent lipid tests were reviewed and are normal. Exacerbating diseases include chronic renal disease, diabetes, hypothyroidism and obesity. Factors aggravating his hyperlipidemia include fatty foods and thiazides. Current antihyperlipidemic treatment includes statins. The current treatment provides significant improvement of lipids. Compliance problems include adherence to diet and adherence to exercise.  Risk factors for coronary artery disease include diabetes mellitus, dyslipidemia, hypertension, male sex, obesity and a sedentary lifestyle.  Hypertension This is a chronic problem. The current episode started more than 1 year ago. The problem has been gradually improving since onset. The problem is controlled. Associated symptoms include peripheral edema. Pertinent negatives include no palpitations. Agents associated with hypertension include thyroid hormones. Risk factors for coronary artery disease include diabetes mellitus, dyslipidemia, family history, obesity, male gender and sedentary lifestyle. Past treatments include calcium channel blockers, diuretics and angiotensin blockers. The current treatment provides  mild improvement. Compliance problems include diet and exercise.  Hypertensive end-organ damage includes kidney disease and retinopathy. Identifiable causes of hypertension include chronic renal disease and a thyroid problem.  Thyroid Problem Presents for follow-up visit. Symptoms include weight loss. Patient reports no anxiety, cold intolerance, constipation, depressed mood, fatigue, heat intolerance, leg swelling, palpitations, tremors or weight gain. The symptoms have been stable. His past medical history is significant for diabetes and hyperlipidemia.     Review of systems  Constitutional: + steadily decreasing body weight, current Body mass index is 46.39 kg/m.,  no subjective hyperthermia, no subjective hypothermia Eyes: no blurry vision, no xerophthalmia ENT: no sore throat, no nodules palpated in throat, no dysphagia/odynophagia, no hoarseness Cardiovascular: no chest pain, no shortness of breath, no palpitations, + ankle swelling Respiratory: no cough, no shortness of breath Gastrointestinal: no nausea/vomiting/diarrhea Musculoskeletal: right knee pain (needs TKR) Skin: no rashes, no hyperemia Neurological: no tremors, no numbness, no tingling, no dizziness Psychiatric: no depression, no anxiety  Objective:     BP (!) 142/78 (BP Location: Left Arm, Patient Position: Sitting, Cuff Size: Large) Comment: Repeat BP with manuel cuff. Patient advised to followup with PCP and Kiyra Slaubaugh Readon,NPmade aware  Pulse 64   Ht 6\' 1"  (1.854 m)   Wt (!) 351 lb 9.6 oz (159.5 kg)   BMI 46.39 kg/m   Wt Readings from Last 3 Encounters:  12/21/22 (!) 351 lb 9.6 oz (159.5 kg)  08/21/22 (!) 355 lb (161 kg)  07/11/22 (!) 357 lb 3.2 oz (162 kg)     BP Readings from Last 3 Encounters:  12/21/22 (!) 142/78  08/21/22 107/62  07/11/22 130/80      Physical Exam- Limited  Constitutional:  Body mass index is 46.39 kg/m. , not in acute distress, normal state of mind Eyes:  EOMI, no  exophthalmos Musculoskeletal: no gross deformities, strength intact in all four extremities, no gross restriction of joint movements Skin:  no rashes, no hyperemia Neurological: no tremor with outstretched hands   Diabetic Foot Exam - Simple   No data filed      CMP ( most recent) CMP     Component Value Date/Time   NA 139 12/14/2022 0957   K 5.2 12/14/2022 0957   CL 103 12/14/2022 0957   CO2 24 12/14/2022 0957   GLUCOSE 96 12/14/2022 0957   GLUCOSE 131 (H) 05/24/2022 1226   BUN 21 12/14/2022 0957   CREATININE 1.37 (H) 12/14/2022 0957   CREATININE 1.19 (H) 11/03/2019 1035   CALCIUM 9.4 12/14/2022 0957   PROT 6.5 12/14/2022 0957  ALBUMIN 3.7 (L) 12/14/2022 0957   AST 17 12/14/2022 0957   ALT 14 12/14/2022 0957   ALKPHOS 71 12/14/2022 0957   BILITOT 0.3 12/14/2022 0957   GFRNONAA >60 05/24/2022 1226   GFRNONAA 60 11/03/2019 1035   GFRAA 70 11/03/2019 1035     Diabetic Labs (most recent): Lab Results  Component Value Date   HGBA1C 7.2 (A) 12/21/2022   HGBA1C 7.0 (A) 12/13/2021   HGBA1C 7.6 09/08/2021     Lipid Panel ( most recent) Lipid Panel     Component Value Date/Time   CHOL 91 (L) 08/11/2021 0825   TRIG 59 08/11/2021 0825   HDL 41 08/11/2021 0825   CHOLHDL 2.2 08/11/2021 0825   LDLCALC 36 08/11/2021 0825   LABVLDL 14 08/11/2021 0825      Lab Results  Component Value Date   TSH 1.200 12/14/2022   TSH 1.340 08/11/2021   TSH 1.440 01/03/2021   FREET4 1.51 12/14/2022   FREET4 1.46 08/11/2021   FREET4 1.27 01/03/2021           Assessment & Plan:   1) Diabetes mellitus without complication (HCC)  He presents today with his meter, no logs, showing at goal fasting glycemic profile.  His POCT A1c today is 7.2%, improving from last visit of 7.4%.  He denies any significant hypoglycemia although there was a reading of 58 in his meter, he cannot remember the specifics surrounding that event.  Analysis of his meter shows 7-day average of 125, 14-day  average of 123, 30-day average of 120, 90-day average of 118.  - GLENDAL MARKS has currently uncontrolled symptomatic type 2 DM since 77 years of age.  -Recent labs reviewed.  - I had a long discussion with him about the progressive nature of diabetes and the pathology behind its complications. 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 These are all discussed in detail with him.  - Nutritional counseling repeated at each appointment due to patients tendency to fall back in to old habits.  - The patient admits there is a room for improvement in their diet and drink choices. -  Suggestion is made for the patient to avoid simple carbohydrates from their diet including Cakes, Sweet Desserts / Pastries, Ice Cream, Soda (diet and regular), Sweet Tea, Candies, Chips, Cookies, Sweet Pastries, Store Bought Juices, Alcohol in Excess of 1-2 drinks a day, Artificial Sweeteners, Coffee Creamer, and "Sugar-free" Products. This will help patient to have stable blood glucose profile and potentially avoid unintended weight gain.   - I encouraged the patient to switch to unprocessed or minimally processed complex starch and increased protein intake (animal or plant source), fruits, and vegetables.   - Patient is advised to stick to a routine mealtimes to eat 3 meals a day and avoid unnecessary snacks (to snack only to correct hypoglycemia).  The following Lifestyle Medicine recommendations according to American College of Lifestyle Medicine  [facility]) were discussed and and offered to patient and he  agrees to start the journey:  A. Whole Foods, Plant-Based Nutrition comprising of fruits and vegetables, plant-based proteins, whole-grain carbohydrates was discussed in detail with the patient.   A list for source of those nutrients were also provided to the patient.   Patient will use only water or unsweetened tea for hydration. B.  The need to stay away from risky substances including  alcohol, smoking; obtaining 7 to 9 hours of restorative sleep, at least 150 minutes of moderate intensity exercise weekly, the importance of healthy social connections,  and stress management techniques were discussed. C.  A full color page of  Calorie density of various food groups per pound showing examples of each food groups was provided to the patient.  - I have approached him with the following individualized plan to manage his diabetes and patient agrees:   -He is advised to continue his Lantus 50 units SQ nightly reach out to his PCP about increasing his Ozempic to 2 mg SQ weekly (he gets this from patient assistance through them).  He can continue Farxiga 10 mg po daily, prescribed by nephrology (we did go over s/s to watch for including yeast formation in folds) and Metformin 500 mg BID.  -he is encouraged to continue monitoring glucose 4 times daily, before meals and before bed, and to call the clinic if he has readings less than 70 or greater than 200 for 3 tests in a row.  He could greatly benefit from CGM device, especially since he has random hypoglycemia.  He received his CGM from [employer], but has not started wearing it, prefers to stick to traditional fingersticks.   - he is warned not to take insulin without proper monitoring per orders.  - Adjustment parameters are given to him for hypo and hyperglycemia in writing.  - Specific targets for  A1c; LDL, HDL, and Triglycerides were discussed with the patient.  2) Blood Pressure /Hypertension: -His blood pressure is not controlled to target but is stable.  he is advised to continue his current medications including Norvasc 10 mg p.o. daily with breakfast, Hydralazine 50 mg po TID, and Olmesartan 40 mg po daily.   3) Lipids/Hyperlipidemia:    Review of his recent lipid panel from 02/23/22 showed controlled LDL at  38.  He is advised to continue Lipitor 20 mg p.o. daily at bedtime.  Side effects and precautions discussed with him.  4)  Weight/Diet:  his Body mass index is 46.39 kg/m.  -  clearly complicating his diabetes care.  he is a candidate for weight loss. I discussed with him the fact that loss of 5 - 10% of his  current body weight will have the most impact on his diabetes management.  Exercise, and detailed carbohydrates information provided  -  detailed on discharge instructions.    5) Hypothyroidism- unspecified His previsit TFTs are consistent with appropriate hormone replacement.  He is advised to continue Levothyroxine 137 mcg p.o. daily before breakfast.    - The correct intake of thyroid hormone (Levothyroxine, Synthroid), is on empty stomach first thing in the morning, with water, separated by at least 30 minutes from breakfast and other medications,  and separated by more than 4 hours from calcium, iron, multivitamins, acid reflux medications (PPIs).  - This medication is a life-long medication and will be needed to correct thyroid hormone imbalances for the rest of your life.  The dose may change from time to time, based on thyroid blood work.  - It is extremely important to be consistent taking this medication, near the same time each morning.  -AVOID TAKING PRODUCTS CONTAINING BIOTIN (commonly found in Hair, Skin, Nails vitamins) AS IT INTERFERES WITH THE VALIDITY OF THYROID FUNCTION BLOOD TESTS.  6) Chronic Care/Health Maintenance: -he is on ACEI/ARB and Statin medications and is encouraged to initiate and continue to follow up with Ophthalmology, Dentist, Podiatrist at least yearly or  according to recommendations, and advised to stay away from smoking. I have recommended yearly flu vaccine and pneumonia vaccine at least every 5 years; moderate intensity exercise for up to 150 minutes weekly; and sleep for at least 7 hours a day.  - he is advised to maintain close follow up with Benita Stabile, MD for primary care needs, as well as his other providers for optimal and coordinated care.     I spent  32  minutes in the care of the patient today including review of labs from CMP, Lipids, Thyroid Function, Hematology (current and previous including abstractions from other facilities); face-to-face time discussing  his blood glucose readings/logs, discussing hypoglycemia and hyperglycemia episodes and symptoms, medications doses, his options of short and long term treatment based on the latest standards of care / guidelines;  discussion about incorporating lifestyle medicine;  and documenting the encounter. Risk reduction counseling performed per USPSTF guidelines to reduce obesity and cardiovascular risk factors.     Please refer to Patient Instructions for Blood Glucose Monitoring and Insulin/Medications Dosing Guide"  in media tab for additional information. Please  also refer to " Patient Self Inventory" in the Media  tab for reviewed elements of pertinent patient history.  Bennie Pierini participated in the discussions, expressed understanding, and voiced agreement with the above plans.  All questions were answered to his satisfaction. he is encouraged to contact clinic should he have any questions or concerns prior to his return visit.   Follow up plan: - Return in about 4 months (around 04/23/2023) for Diabetes F/U with A1c in office, Thyroid follow up, No previsit labs, Bring meter and logs.   Ronny Bacon, [facility] [address] [place], [place] [address] Phone: [phone] Fax: [phone]  12/21/2022, 2:04 PM

## 2023-01-23 ENCOUNTER — Other Ambulatory Visit: Payer: Self-pay

## 2023-01-23 DIAGNOSIS — Z91141 Patient's other noncompliance with medication regimen due to financial hardship: Secondary | ICD-10-CM

## 2023-01-23 DIAGNOSIS — Z5986 Financial insecurity: Secondary | ICD-10-CM

## 2023-01-23 NOTE — Progress Notes (Signed)
   01/23/2023  Patient ID: Ronald Heath, male   DOB: 28-Feb-1946, 77 y.o.   MRN: [id number]    01/23/2023  Ronald Heath 17-Sep-1945 [id number]   2025 Medication Assistance Renewal Application Summary:  Patient was outreached regarding medication assistance renewal for 2025. Verified address, anticipated insurance for 2025, and income has not changed. Patient remains interested in PAP for 2025 for Ozempic, Ronald Heath, and Ronald Heath, please see below for other medications identified for medication assistance.    Medication Review Findings:  Ozempic prescription recently increased from 1 mg to 2 mg once weekly. Patient has not increased dose as instructed since there was no updated prescription for Ozempic since to [employer].  Entresto - no PA at this time. Per discussion with patient regarding his income, he is on the cusp of qualifying. Informed patient that we will still apply for PA and waitlist for a grant program. Of note, he has not filled Entresto for the past month due to cost but used a family members Entresto with the same dose. Educated the patient regarding the harm and concerns associated with using someone else's medication. The patient acknowledged and is aware.  No changes or concerns with Entresto.   Medication Assistance Findings:  Medication assistance needs identified: Ronald Heath, Ronald Heath, and Ronald Heath   Plan: I will route patient assistance letter to pharmacy technician who will coordinate patient assistance program application process for medications listed above.  Pharmacy technician will assist with obtaining all required documents from both patient and provider(s) and submit application(s) once completed.  Messaged Lupita Raider, NP as he is the prescribing provider for PA application to [employer]. Will send chart to endocrinologist provider.    Will reach out to cardiologist Dr. Kristeen Miss regarding assistance with obtaining Sherryll Burger for the remaining  part of this year.   Thank you for allowing pharmacy to be a part of this patient's care.  Cephus Shelling, PharmD Clinical Pharmacist [facility] Cell: [phone]

## 2023-01-24 ENCOUNTER — Other Ambulatory Visit: Payer: Self-pay | Admitting: Nurse Practitioner

## 2023-01-24 MED ORDER — SEMAGLUTIDE (2 MG/DOSE) 8 MG/3ML ~~LOC~~ SOPN
2.0000 mg | PEN_INJECTOR | SUBCUTANEOUS | Status: AC
Start: 2023-01-24 — End: ?

## 2023-02-01 ENCOUNTER — Other Ambulatory Visit: Payer: Self-pay | Admitting: Pharmacy Technician

## 2023-02-01 DIAGNOSIS — Z5986 Financial insecurity: Secondary | ICD-10-CM

## 2023-02-01 NOTE — Progress Notes (Signed)
Pharmacy Medication Assistance Program Note    02/01/2023  Patient ID: Ronald Heath, male   DOB: 03-Aug-1945, 77 y.o.   MRN: [id number]     02/01/2023  Outreach Medication One  Initial Outreach Date (Medication One) 01/31/2023  Manufacturer Medication One [employer] Entresto  Dose of Entresto 97-103mg   Type of Radiographer, therapeutic Assistance  Date Application Sent to Patient 01/31/2023  Application Items Requested Application;Proof of Income;Other  Date Application Sent to Prescriber 02/02/2023  Name of Prescriber Kristeen Miss         02/01/2023  Outreach Medication Two  Initial Outreach Date (Medication Two) 01/31/2023  Manufacturer Medication Two Sanofi  [employer] Lantus  Dose of Lantus 100u/ml  Type of Radiographer, therapeutic Assistance  Date Application Sent to Patient 01/31/2023  Application Items Requested Application;Proof of Income;Other  Date Application Sent to Prescriber 02/02/2023  Name of Prescriber Ronny Bacon         02/01/2023  Outreach Medication Three  Initial Outreach Date (Medication Three) 01/31/2023  Manufacturer Medication Three Astra Zeneca  [employer] [facility] Farxiga  Dose of Farxiga 10mg   Type of Radiographer, therapeutic Assistance  Date Application Sent to Patient 01/31/2023  Application Items Requested Application;Proof of Income;Other  Date Application Sent to Prescriber 02/02/2023  Name of Prescriber Ronny Bacon  Anticpated Follow Up Date with Patient/Provider 03/29/2023         02/01/2023  Outreach Medication Four  Initial Outreach Date (Medication Four) 01/31/2023  Manufacturer Medication Four Novo Nordisk  [employer] [facility] Ozempic  Dose of Ozempic 8mg /34ml  Type of Radiographer, therapeutic Assistance  Date Application Sent to Patient 01/31/2023  Application Items Requested Application;Proof of Income;Other  Date Application Sent to Prescriber 02/02/2023  Name of Prescriber Ronny Bacon   Anticpated Follow Up Date with Patient/Provider 03/29/2023    Pattricia Boss, CPhT [place]  Office: [phone] Fax: [phone] Email: [email or web address]

## 2023-02-07 ENCOUNTER — Telehealth: Payer: Self-pay | Admitting: Pharmacist

## 2023-02-07 MED ORDER — DAPAGLIFLOZIN PROPANEDIOL 10 MG PO TABS: 10.0000 mg | ORAL_TABLET | Freq: Every day | ORAL | 3 refills | Status: AC

## 2023-02-07 NOTE — Telephone Encounter (Addendum)
[facility] to give [employer] for [place].   Called patient to inform him that the pharmacy has that information. Also, that there should be no charge at pick up.

## 2023-02-07 NOTE — Telephone Encounter (Signed)
Patient enrolled in the [facility] grant for cardiomyopathy. He can use this for his Ronald Heath  Card No. [id number]  BIN [id number]  PCN [id number]  PC Group [id number]

## 2023-02-14 DIAGNOSIS — D519 Vitamin B12 deficiency anemia, unspecified: Secondary | ICD-10-CM | POA: Diagnosis not present

## 2023-02-14 DIAGNOSIS — I10 Essential (primary) hypertension: Secondary | ICD-10-CM | POA: Diagnosis not present

## 2023-02-14 DIAGNOSIS — E1165 Type 2 diabetes mellitus with hyperglycemia: Secondary | ICD-10-CM | POA: Diagnosis not present

## 2023-02-14 DIAGNOSIS — E039 Hypothyroidism, unspecified: Secondary | ICD-10-CM | POA: Diagnosis not present

## 2023-02-20 DIAGNOSIS — E1165 Type 2 diabetes mellitus with hyperglycemia: Secondary | ICD-10-CM | POA: Diagnosis not present

## 2023-02-20 DIAGNOSIS — Z23 Encounter for immunization: Secondary | ICD-10-CM | POA: Diagnosis not present

## 2023-02-20 DIAGNOSIS — I509 Heart failure, unspecified: Secondary | ICD-10-CM | POA: Diagnosis not present

## 2023-02-20 DIAGNOSIS — E782 Mixed hyperlipidemia: Secondary | ICD-10-CM | POA: Diagnosis not present

## 2023-02-20 DIAGNOSIS — N1831 Chronic kidney disease, stage 3a: Secondary | ICD-10-CM | POA: Diagnosis not present

## 2023-02-20 DIAGNOSIS — R809 Proteinuria, unspecified: Secondary | ICD-10-CM | POA: Diagnosis not present

## 2023-02-20 DIAGNOSIS — I10 Essential (primary) hypertension: Secondary | ICD-10-CM | POA: Diagnosis not present

## 2023-02-20 DIAGNOSIS — F331 Major depressive disorder, recurrent, moderate: Secondary | ICD-10-CM | POA: Diagnosis not present

## 2023-02-20 DIAGNOSIS — I130 Hypertensive heart and chronic kidney disease with heart failure and stage 1 through stage 4 chronic kidney disease, or unspecified chronic kidney disease: Secondary | ICD-10-CM | POA: Diagnosis not present

## 2023-02-20 DIAGNOSIS — R0600 Dyspnea, unspecified: Secondary | ICD-10-CM | POA: Diagnosis not present

## 2023-02-22 DIAGNOSIS — E1142 Type 2 diabetes mellitus with diabetic polyneuropathy: Secondary | ICD-10-CM | POA: Diagnosis not present

## 2023-02-22 DIAGNOSIS — B351 Tinea unguium: Secondary | ICD-10-CM | POA: Diagnosis not present

## 2023-03-17 ENCOUNTER — Other Ambulatory Visit: Payer: Self-pay | Admitting: Cardiology

## 2023-04-10 ENCOUNTER — Other Ambulatory Visit: Payer: Self-pay

## 2023-04-10 MED ORDER — CARVEDILOL 3.125 MG PO TABS: 3.1250 mg | ORAL_TABLET | Freq: Two times a day (BID) | ORAL | 0 refills | Status: DC

## 2023-04-19 ENCOUNTER — Telehealth: Payer: Self-pay | Admitting: Pharmacy Technician

## 2023-04-19 DIAGNOSIS — Z5986 Financial insecurity: Secondary | ICD-10-CM

## 2023-04-19 NOTE — Progress Notes (Addendum)
Pharmacy Medication Assistance Program Note    04/23/2023  Patient ID: Ronald Heath, male  DOB: 1945-08-22, 78 y.o.  MRN:  [id number]     02/01/2023 04/19/2023  Outreach Medication Three  Initial Outreach Date (Medication Three) 01/31/2023   Manufacturer Medication Three [employer]   [employer] [facility] Farxiga   Dose of Farxiga 10mg    Type of Radiographer, therapeutic Assistance   Date Application Sent to Patient 01/31/2023   Application Items Requested Application;Proof of Income;Other   Date Application Sent to Prescriber 02/02/2023   Name of Prescriber Ronny Bacon   Anticpated Follow Up Date with Patient/Provider 03/29/2023   Application Items Received From Patient  Other  Date Application Received From Provider  02/08/2023  Date Application Submitted to Manufacturer  03/19/2024  Method Application Sent to Manufacturer  Fax  Patient Assistance Determination  Approved  Approval Start Date  04/16/2023  Approval End Date  03/19/2024  Patient Notification Method  Telephone Call     Care coordination call placed to [employer] in regard to [facility] application. Per IVR system, patient is APPROVED 04/16/23-03/19/24. Medication will process and ship automatically to the patient's home address on file. Unsuccessful outreach to patient. HIPAA compliant v/m left requesting a return call  2nd care coordination call placed t [employer] in regard to [facility] application. Per IVR system, there is missing information Faxed provider part to [employer].  Unsuccessful outreach to patient. HIPAA compliant voicemail was left with return call number. Was calling to inquire if patient had received the applications that were mailed to him on 01/31/2023. Of note, the following information was found in [facility] EPIC regarding Entresto: [name], RPH documented on 02/07/23 that Patient enrolled [facility] for cardiomyopathy. He can use this for his [place]. [name]  Student-PharmD also documented that she [employer] to give [employer] for [place]. Called patient to inform him that the pharmacy has that information. Also, that there should be no charge at pick up.  Card [id number], BIN [id number], PCN [id number],  [id number] Group [id number]   [facility]  Office: [phone] Fax: [phone] Email: [name]

## 2023-04-23 ENCOUNTER — Telehealth: Payer: Self-pay | Admitting: Pharmacy Technician

## 2023-04-23 ENCOUNTER — Ambulatory Visit: Payer: PPO | Admitting: Nurse Practitioner

## 2023-04-23 DIAGNOSIS — Z7984 Long term (current) use of oral hypoglycemic drugs: Secondary | ICD-10-CM

## 2023-04-23 DIAGNOSIS — Z5986 Financial insecurity: Secondary | ICD-10-CM

## 2023-04-23 DIAGNOSIS — E119 Type 2 diabetes mellitus without complications: Secondary | ICD-10-CM

## 2023-04-23 DIAGNOSIS — I10 Essential (primary) hypertension: Secondary | ICD-10-CM

## 2023-04-23 DIAGNOSIS — E039 Hypothyroidism, unspecified: Secondary | ICD-10-CM

## 2023-04-23 DIAGNOSIS — E782 Mixed hyperlipidemia: Secondary | ICD-10-CM

## 2023-04-23 DIAGNOSIS — Z794 Long term (current) use of insulin: Secondary | ICD-10-CM

## 2023-04-23 DIAGNOSIS — Z7985 Long-term (current) use of injectable non-insulin antidiabetic drugs: Secondary | ICD-10-CM

## 2023-04-23 NOTE — Progress Notes (Signed)
Pharmacy Medication Assistance Program Note    04/23/2023  Patient ID: Ronald Heath, male  DOB: 1945-07-02, 78 y.o.  MRN:  [id number]     02/01/2023 04/23/2023  Outreach Medication Four  Initial Outreach Date (Medication Four) 01/31/2023   Manufacturer Medication Four [employer]   [employer] Drugs Ozempic   Dose of Ozempic 8mg /56ml   Type of Radiographer, therapeutic Assistance   Date Application Sent to Patient 01/31/2023   Application Items Requested Application;Proof of Income;Other   Date Application Sent to Prescriber 02/02/2023   Name of Prescriber Ronny Bacon   Anticpated Follow Up Date with Patient/Provider 03/29/2023   Date Application Received From Patient  04/23/2023  Application Items Received From Patient  Application;Other  Date Application Received From Provider  02/08/2023  Date Application Submitted to Manufacturer  04/23/2023  Method Application Sent to Manufacturer  Fax   Submitted application to [employer].  Pattricia Boss, CPhT [name]  Office: [phone] Fax: [phone] Email: [name]

## 2023-04-27 ENCOUNTER — Telehealth: Payer: Self-pay | Admitting: Pharmacy Technician

## 2023-04-27 DIAGNOSIS — Z5986 Financial insecurity: Secondary | ICD-10-CM

## 2023-04-27 NOTE — Progress Notes (Signed)
   04/27/2023  Patient ID: Ronald Heath, male   DOB: June 03, 1945, 78 y.o.   MRN: [id number]  Care coordination call placed to [employer] in regard to Ozempic  application.  Spoke to Nika who informs patient will need to submit proof of income and number of people in household due to their system pulling in inconclusive data. Once received, processing will continue.  Successful outreach to patient. HIPAA verified. Patient informs he received the letter from [employer] stating income and household number were needed. He informed his plan is to take the letter and his tax return to Dr. Milford office for them to fax into [employer] as this would be a faster method than mailing it back to them.  Inquired of patient his knowledge of receiving a [employer] patient assistance application from [employer]. patient informs he did not receive this application. patient is unsure if he is enrolled as he thought the provider's office was handling all of his re enrollments. he informs he has not received a shipment this year. Inquire if patient would be amenable in having another application mailed to him. Informed patient he would need to sign the application and complete the areas that are starred. Patient agreed to complete the application and mail back with enclosed envelope.  Patient informs he is a household of 1 and confirmed address in EPIC [place] is correct. Will place application in mail to patient when in office next week.  Kate Caddy, CPhT [place]  Office: [phone] Fax: [phone] Email: [email or web address]

## 2023-05-08 ENCOUNTER — Encounter: Payer: Self-pay | Admitting: Nurse Practitioner

## 2023-05-08 ENCOUNTER — Ambulatory Visit: Payer: PPO | Admitting: Nurse Practitioner

## 2023-05-08 VITALS — BP 148/68 | HR 74 | Ht 73.0 in | Wt 369.8 lb

## 2023-05-08 DIAGNOSIS — Z7984 Long term (current) use of oral hypoglycemic drugs: Secondary | ICD-10-CM

## 2023-05-08 DIAGNOSIS — B351 Tinea unguium: Secondary | ICD-10-CM | POA: Diagnosis not present

## 2023-05-08 DIAGNOSIS — Z7985 Long-term (current) use of injectable non-insulin antidiabetic drugs: Secondary | ICD-10-CM

## 2023-05-08 DIAGNOSIS — E782 Mixed hyperlipidemia: Secondary | ICD-10-CM

## 2023-05-08 DIAGNOSIS — I10 Essential (primary) hypertension: Secondary | ICD-10-CM

## 2023-05-08 DIAGNOSIS — E119 Type 2 diabetes mellitus without complications: Secondary | ICD-10-CM

## 2023-05-08 DIAGNOSIS — E039 Hypothyroidism, unspecified: Secondary | ICD-10-CM

## 2023-05-08 DIAGNOSIS — Z794 Long term (current) use of insulin: Secondary | ICD-10-CM | POA: Diagnosis not present

## 2023-05-08 DIAGNOSIS — E1142 Type 2 diabetes mellitus with diabetic polyneuropathy: Secondary | ICD-10-CM | POA: Diagnosis not present

## 2023-05-08 NOTE — Progress Notes (Signed)
Endocrinology Follow Up Note       05/08/2023, 4:13 PM   Subjective:    Patient ID: Ronald Heath, male    DOB: 03/05/1946.  Ronald Heath is being seen in follow up after being seen in consultation for management of currently uncontrolled symptomatic diabetes requested by  Benita Stabile, MD.  He is transferring care from his previous Endocrinologist Dr. Leslie Dales who retired.   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   40-45%, mod conc LVH   Hyperlipidemia    Hypertension    Hypothyroidism    Shortness of breath    with exertion   Sleep apnea    pt is supposed to wear CPAP, but doesn't wear it    Past Surgical History:  Procedure Laterality Date   AMPUTATION Left 01/01/2019   Procedure: AMPUTATION LEFT INDEX FINGER;  Surgeon: Mack Hook, MD;  Location: [facility] OR;  Service: Orthopedics;  Laterality: Left;   CATARACT EXTRACTION W/PHACO Right 11/21/2012   Procedure: CATARACT EXTRACTION PHACO AND INTRAOCULAR LENS PLACEMENT (IOC);  Surgeon: Gemma Payor, MD;  Location: [facility];  Service: Ophthalmology;  Laterality: Right;  CDE:19.67   CATARACT EXTRACTION W/PHACO Left 12/19/2012   Procedure: CATARACT EXTRACTION PHACO AND INTRAOCULAR LENS PLACEMENT (IOC);  Surgeon: Gemma Payor, MD;  Location: [facility];  Service: Ophthalmology;  Laterality: Left;  CDE:8.00   KNEE ARTHROSCOPY Left    ROTATOR CUFF REPAIR Right     Social History   Socioeconomic History   Marital status: Widowed    Spouse name: Not on file   Number of children: Not on file    Years of education: Not on file   Highest education level: Not on file  Occupational History   Not on file  Tobacco Use   Smoking status: Former    Current packs/day: 2.00    Average packs/day: 2.0 packs/day for 15.0 years (30.0 ttl pk-yrs)    Types: Cigarettes   Smokeless tobacco: Never  Vaping Use   Vaping status: Never Used  Substance and Sexual Activity   Alcohol use: Yes    Alcohol/week: 2.0 standard drinks of alcohol    Types: 2 Cans of beer per week   Drug use: No   Sexual activity: Not on file  Other Topics Concern   Not on file  Social History Narrative   Not on file   Social Drivers of Health   Financial Resource Strain: Not on file  Food Insecurity: Not on file  Transportation Needs: Not on file  Physical Activity: Not on file  Stress: Not on file  Social Connections: Not on file    Family History  Problem Relation Age of Onset   Diabetes Mellitus II Father    Stroke Father     Outpatient Encounter Medications as of 05/08/2023  Medication  Sig   albuterol (VENTOLIN HFA) 108 (90 Base) MCG/ACT inhaler 2 Puff(s) By Mouth Every 4 Hours PRN   aspirin EC 81 MG tablet Take 81 mg by mouth daily.   atorvastatin (LIPITOR) 20 MG tablet Take 20 mg by mouth daily.   azelastine (ASTELIN) 0.1 % nasal spray Place 2 sprays into both nostrils 2 (two) times daily.   carvedilol (COREG) 3.125 MG tablet Take 1 tablet (3.125 mg total) by mouth 2 (two) times daily.   dapagliflozin propanediol (FARXIGA) 10 MG TABS tablet Take 1 tablet (10 mg total) by mouth daily.   glucose blood test strip 1 each by Other route 4 (four) times daily. Use as instructed   hydrALAZINE (APRESOLINE) 50 MG tablet Take 50 mg by mouth 3 (three) times daily.   insulin glargine (LANTUS) 100 UNIT/ML injection Inject 50 Units into the skin daily.   levothyroxine (SYNTHROID, LEVOTHROID) 137 MCG tablet Take 137 mcg by mouth daily before breakfast.   metFORMIN (GLUCOPHAGE-XR) 500 MG 24 hr tablet Take 500 mg by  mouth 2 (two) times daily with a meal.   OneTouch Delica Lancets 30G MISC 1 each by Does not apply route 4 (four) times daily.   PARoxetine (PAXIL) 40 MG tablet Take 20 mg by mouth every morning.   pramipexole (MIRAPEX) 1 MG tablet Take 1 mg by mouth at bedtime.   sacubitril-valsartan (ENTRESTO) 97-103 MG Take 1 tablet by mouth 2 (two) times daily.   Semaglutide, 2 MG/DOSE, 8 MG/3ML SOPN Inject 2 mg as directed once a week.   vitamin B-12 (CYANOCOBALAMIN) 1000 MCG tablet Take 1,000 mcg by mouth daily.   No facility-administered encounter medications on file as of 05/08/2023.    ALLERGIES: No Known Allergies  VACCINATION STATUS: Immunization History  Administered Date(s) Administered   Influenza, High Dose Seasonal PF 01/16/2018   Influenza-Unspecified 12/19/2019   Moderna Sars-Covid-2 Vaccination 05/31/2019, 06/21/2019, 02/19/2020   Tdap 12/30/2018    Diabetes He presents for his follow-up diabetic visit. He has type 2 diabetes mellitus. Onset time: Diagnosed at approx age of 17. His disease course has been stable. There are no hypoglycemic associated symptoms. Pertinent negatives for hypoglycemia include no nervousness/anxiousness or tremors. Associated symptoms include foot paresthesias. Pertinent negatives for diabetes include no fatigue, no polydipsia, no polyuria and no weight loss. There are no hypoglycemic complications. Symptoms are stable. Diabetic complications include nephropathy, peripheral neuropathy and retinopathy. Risk factors for coronary artery disease include diabetes mellitus, dyslipidemia, family history, hypertension, male sex, obesity and sedentary lifestyle. Current diabetic treatment includes intensive insulin program and oral agent (dual therapy) (and Ozempic). He is compliant with treatment most of the time. His weight is decreasing steadily. He is following a generally healthy diet. When asked about meal planning, he reported none. He has not had a previous visit  with a dietitian. He never participates in exercise. His home blood glucose trend is fluctuating minimally. His breakfast blood glucose range is generally 110-130 mg/dl. His overall blood glucose range is 130-140 mg/dl. (He presents today with his meter, no logs, showing at goal fasting glycemic profile.  His most recent A1c on 11/27 was 7.4%, increasing slightly from last visit of 7.2%.  He notes he has been without his Ozempic for about 2 months due to problem with his order from patient assistance (set up through PCP).  Analysis of his meter shows 7-day average of 136, 14-day average of 125, 30-day average of 116, 90-day average of 125.  He has regained some weight as a  result of being off the Ozempic. ) An ACE inhibitor/angiotensin II receptor blocker is being taken. He sees a podiatrist.Eye exam is current.  Hyperlipidemia This is a chronic problem. The current episode started more than 1 year ago. The problem is controlled. Recent lipid tests were reviewed and are normal. Exacerbating diseases include chronic renal disease, diabetes, hypothyroidism and obesity. Factors aggravating his hyperlipidemia include fatty foods and thiazides. Current antihyperlipidemic treatment includes statins. The current treatment provides significant improvement of lipids. Compliance problems include adherence to diet and adherence to exercise.  Risk factors for coronary artery disease include diabetes mellitus, dyslipidemia, hypertension, male sex, obesity and a sedentary lifestyle.  Hypertension This is a chronic problem. The current episode started more than 1 year ago. The problem has been gradually improving since onset. The problem is controlled. Associated symptoms include peripheral edema. Pertinent negatives include no palpitations. Agents associated with hypertension include thyroid hormones. Risk factors for coronary artery disease include diabetes mellitus, dyslipidemia, family history, obesity, male gender and  sedentary lifestyle. Past treatments include calcium channel blockers, diuretics and angiotensin blockers. The current treatment provides mild improvement. Compliance problems include diet and exercise.  Hypertensive end-organ damage includes kidney disease and retinopathy. Identifiable causes of hypertension include chronic renal disease and a thyroid problem.  Thyroid Problem Presents for follow-up visit. Patient reports no anxiety, cold intolerance, constipation, depressed mood, fatigue, heat intolerance, leg swelling, palpitations, tremors, weight gain or weight loss. The symptoms have been stable. His past medical history is significant for diabetes and hyperlipidemia.     Review of systems  Constitutional: +increasing body weight- been off Ozempic for 2 months, current Body mass index is 48.79 kg/m.,  no subjective hyperthermia, no subjective hypothermia Eyes: no blurry vision, no xerophthalmia ENT: no sore throat, no nodules palpated in throat, no dysphagia/odynophagia, no hoarseness Cardiovascular: no chest pain, no shortness of breath, no palpitations, + ankle swelling Respiratory: no cough, no shortness of breath Gastrointestinal: no nausea/vomiting/diarrhea Musculoskeletal: right knee pain (needs TKR) Skin: no rashes, no hyperemia Neurological: no tremors, no numbness, no tingling, no dizziness Psychiatric: no depression, no anxiety  Objective:     BP (!) 148/68 (BP Location: Left Arm, Patient Position: Sitting, Cuff Size: Large) Comment: Patient shares that he was late taking his medication this morning, he just took it prior to this appointment.  Pulse 74   Ht 6\' 1"  (1.854 m)   Wt (!) 369 lb 12.8 oz (167.7 kg)   BMI 48.79 kg/m   Wt Readings from Last 3 Encounters:  05/08/23 (!) 369 lb 12.8 oz (167.7 kg)  12/21/22 (!) 351 lb 9.6 oz (159.5 kg)  08/21/22 (!) 355 lb (161 kg)     BP Readings from Last 3 Encounters:  05/08/23 (!) 148/68  12/21/22 (!) 142/78  08/21/22  107/62      Physical Exam- Limited  Constitutional:  Body mass index is 48.79 kg/m. , not in acute distress, normal state of mind Eyes:  EOMI, no exophthalmos Musculoskeletal: no gross deformities, strength intact in all four extremities, no gross restriction of joint movements Skin:  no rashes, no hyperemia Neurological: no tremor with outstretched hands   Diabetic Foot Exam - Simple   No data filed      CMP ( most recent) CMP     Component Value Date/Time   NA 139 12/14/2022 0957   K 5.2 12/14/2022 0957   CL 103 12/14/2022 0957   CO2 24 12/14/2022 0957   GLUCOSE 96 12/14/2022 0957  GLUCOSE 131 (H) 05/24/2022 1226   BUN 21 12/14/2022 0957   CREATININE 1.37 (H) 12/14/2022 0957   CREATININE 1.19 (H) 11/03/2019 1035   CALCIUM 9.4 12/14/2022 0957   CALCIUM 8.4 10/13/2021 0000   PROT 6.5 12/14/2022 0957   ALBUMIN 3.7 (L) 12/14/2022 0957   AST 17 12/14/2022 0957   ALT 14 12/14/2022 0957   ALKPHOS 71 12/14/2022 0957   BILITOT 0.3 12/14/2022 0957   GFRNONAA >60 05/24/2022 1226   GFRNONAA 60 11/03/2019 1035   GFRAA 70 11/03/2019 1035     Diabetic Labs (most recent): Lab Results  Component Value Date   HGBA1C 7.2 (A) 12/21/2022   HGBA1C 7.0 (A) 12/13/2021   HGBA1C 7.6 09/08/2021     Lipid Panel ( most recent) Lipid Panel     Component Value Date/Time   CHOL 91 (L) 08/11/2021 0825   TRIG 59 08/11/2021 0825   HDL 41 08/11/2021 0825   CHOLHDL 2.2 08/11/2021 0825   LDLCALC 36 08/11/2021 0825   LABVLDL 14 08/11/2021 0825      Lab Results  Component Value Date   TSH 1.200 12/14/2022   TSH 1.36 10/13/2021   TSH 1.340 08/11/2021   TSH 1.440 01/03/2021   FREET4 1.51 12/14/2022   FREET4 1.42 10/13/2021   FREET4 1.46 08/11/2021   FREET4 1.27 01/03/2021           Assessment & Plan:   1) Diabetes mellitus without complication (HCC)  He presents today with his meter, no logs, showing at goal fasting glycemic profile.  His most recent A1c on 11/27 was  7.4%, increasing slightly from last visit of 7.2%.  He notes he has been without his Ozempic for about 2 months due to problem with his order from patient assistance (set up through PCP).  Analysis of his meter shows 7-day average of 136, 14-day average of 125, 30-day average of 116, 90-day average of 125.  He has regained some weight as a result of being off the Ozempic.  - Ronald Heath has currently uncontrolled symptomatic type 2 DM since 78 years of age.  -Recent labs reviewed.  - I had a long discussion with him about the progressive nature of diabetes and the pathology behind its complications. 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 These are all discussed in detail with him.  - Nutritional counseling repeated at each appointment due to patients tendency to fall back in to old habits.  - The patient admits there is a room for improvement in their diet and drink choices. -  Suggestion is made for the patient to avoid simple carbohydrates from their diet including Cakes, Sweet Desserts / Pastries, Ice Cream, Soda (diet and regular), Sweet Tea, Candies, Chips, Cookies, Sweet Pastries, Store Bought Juices, Alcohol in Excess of 1-2 drinks a day, Artificial Sweeteners, Coffee Creamer, and "Sugar-free" Products. This will help patient to have stable blood glucose profile and potentially avoid unintended weight gain.   - I encouraged the patient to switch to unprocessed or minimally processed complex starch and increased protein intake (animal or plant source), fruits, and vegetables.   - Patient is advised to stick to a routine mealtimes to eat 3 meals a day and avoid unnecessary snacks (to snack only to correct hypoglycemia).  The following Lifestyle Medicine recommendations according to [facility]  [facility]) were  discussed and and offered to patient and he  agrees to start the journey:  A. Whole Foods, Plant-Based Nutrition comprising of fruits and vegetables, plant-based proteins, whole-grain carbohydrates was discussed in detail with the patient.   A list for source of those nutrients were also provided to the patient.  Patient will use only water or unsweetened tea for hydration. B.  The need to stay away from risky substances including alcohol, smoking; obtaining 7 to 9 hours of restorative sleep, at least 150 minutes of moderate intensity exercise weekly, the importance of healthy social connections,  and stress management techniques were discussed. C.  A full color page of  Calorie density of various food groups per pound showing examples of each food groups was provided to the patient.  - I have approached him with the following individualized plan to manage his diabetes and patient agrees:   -He is advised to continue his Lantus 50 units SQ nightly reach out to his PCP about his Ozempic 2 mg SQ weekly (he gets this from patient assistance through them).  I did give him sample pen with instructions to start low again since he has been out for so long.  Start 0.25 mg SQ weekly x 2 weeks, then increase to 0.5 mg SQ weekly.  Once he gets his patient assistance, he can do 37 clicks (1 mg) for 1 month and then get back to his full 2 mg dose.  He can continue Farxiga 10 mg po daily, prescribed by nephrology (we did go over s/s to watch for including yeast formation in folds) and Metformin 500 mg BID.  -he is encouraged to continue monitoring glucose 4 times daily, before meals and before bed, and to call the clinic if he has readings less than 70 or greater than 200 for 3 tests in a row.  He could greatly benefit from CGM device, especially since he has random hypoglycemia.  He received his CGM from [employer], but has not started wearing it, prefers to stick to traditional fingersticks.   - he is warned not to  take insulin without proper monitoring per orders.  - Adjustment parameters are given to him for hypo and hyperglycemia in writing.  - Specific targets for  A1c; LDL, HDL, and Triglycerides were discussed with the patient.  2) Blood Pressure /Hypertension: -His blood pressure is not controlled to target but is stable.  he is advised to continue his current medications including Norvasc 10 mg p.o. daily with breakfast, Hydralazine 50 mg po TID, and Olmesartan 40 mg po daily.   3) Lipids/Hyperlipidemia:    Review of his recent lipid panel from 02/23/22 showed controlled LDL at 38.  He is advised to continue Lipitor 20 mg p.o. daily at bedtime.  Side effects and precautions discussed with him.  4)  Weight/Diet:  his Body mass index is 48.79 kg/m.  -  clearly complicating his diabetes care.  he is a candidate for weight loss. I discussed with him the fact that loss of 5 - 10% of his  current body weight will have the most impact on his diabetes management.  Exercise, and detailed carbohydrates information provided  -  detailed on discharge instructions.    5) Hypothyroidism- unspecified There are no recent TFTs to review.  He is advised to continue Levothyroxine 137 mcg p.o. daily before breakfast.    - The correct intake of thyroid hormone (Levothyroxine, Synthroid), is on empty stomach first thing in the morning, with water, separated by at  least 30 minutes from breakfast and other medications,  and separated by more than 4 hours from calcium, iron, multivitamins, acid reflux medications (PPIs).  - This medication is a life-long medication and will be needed to correct thyroid hormone imbalances for the rest of your life.  The dose may change from time to time, based on thyroid blood work.  - It is extremely important to be consistent taking this medication, near the same time each morning.  -AVOID TAKING PRODUCTS CONTAINING BIOTIN (commonly found in Hair, Skin, Nails vitamins) AS IT INTERFERES  WITH THE VALIDITY OF THYROID FUNCTION BLOOD TESTS.  6) Chronic Care/Health Maintenance: -he is on ACEI/ARB and Statin medications and is encouraged to initiate and continue to follow up with Ophthalmology, Dentist, Podiatrist at least yearly or according to recommendations, and advised to stay away from smoking. I have recommended yearly flu vaccine and pneumonia vaccine at least every 5 years; moderate intensity exercise for up to 150 minutes weekly; and sleep for at least 7 hours a day.  - he is advised to maintain close follow up with Benita Stabile, MD for primary care needs, as well as his other providers for optimal and coordinated care.     I spent  32  minutes in the care of the patient today including review of labs from CMP, Lipids, Thyroid Function, Hematology (current and previous including abstractions from other facilities); face-to-face time discussing  his blood glucose readings/logs, discussing hypoglycemia and hyperglycemia episodes and symptoms, medications doses, his options of short and long term treatment based on the latest standards of care / guidelines;  discussion about incorporating lifestyle medicine;  and documenting the encounter. Risk reduction counseling performed per USPSTF guidelines to reduce obesity and cardiovascular risk factors.     Please refer to Patient Instructions for Blood Glucose Monitoring and Insulin/Medications Dosing Guide"  in media tab for additional information. Please  also refer to " Patient Self Inventory" in the Media  tab for reviewed elements of pertinent patient history.  Ronald Heath participated in the discussions, expressed understanding, and voiced agreement with the above plans.  All questions were answered to his satisfaction. he is encouraged to contact clinic should he have any questions or concerns prior to his return visit.   Follow up plan: - Return in about 4 months (around 09/05/2023) for Diabetes F/U with A1c in office, No  previsit labs, Bring meter and logs.   Ronny Bacon, [facility] [address] [place], [place] [address] Phone: [phone] Fax: [phone]  05/08/2023, 4:13 PM

## 2023-05-11 ENCOUNTER — Telehealth: Payer: Self-pay | Admitting: Pharmacy Technician

## 2023-05-11 DIAGNOSIS — Z5986 Financial insecurity: Secondary | ICD-10-CM

## 2023-05-11 NOTE — Progress Notes (Addendum)
 Pharmacy Medication Assistance Program Note    05/11/2023  Patient ID: Ronald Heath, male  DOB: September 22, 1945, 78 y.o.  MRN:  [id number]     02/01/2023 05/11/2023  Outreach Medication Two  Initial Outreach Date (Medication Two) 01/31/2023   Manufacturer Medication Two [employer]   [employer] [facility] Lantus   Dose of Lantus 100u/ml   Type of Radiographer, therapeutic Assistance   Date Application Sent to Patient 01/31/2023   Application Items Requested Application;Proof of Income;Other   Date Application Sent to Prescriber 02/02/2023   Name of Prescriber Ronny Bacon   Date Application Received From Patient  05/11/2023  Application Items Received From Patient  Application;Proof of Income;Other  Date Application Received From Provider  04/16/2023  Method Application Sent to Manufacturer  Fax  Date Application Submitted to Manufacturer  05/11/2023    Submitted application to [employer].  ADDENDUM 05/22/23 Care coordination call placed to [employer] in regard to Lantus application. Poke to [place] who informs application is still in the processing phase and to check back in a few weeks. Will f/u.  Pattricia Boss, CPhT [place]  Office: [phone] Fax: [phone] Email: [email or web address]

## 2023-05-14 DIAGNOSIS — M79672 Pain in left foot: Secondary | ICD-10-CM | POA: Diagnosis not present

## 2023-05-14 DIAGNOSIS — M722 Plantar fascial fibromatosis: Secondary | ICD-10-CM | POA: Diagnosis not present

## 2023-06-01 ENCOUNTER — Telehealth: Payer: Self-pay | Admitting: Pharmacy Technician

## 2023-06-01 DIAGNOSIS — Z5986 Financial insecurity: Secondary | ICD-10-CM

## 2023-06-01 NOTE — Progress Notes (Signed)
 Pharmacy Medication Assistance Program Note    06/01/2023  Patient ID: Ronald Heath, male  DOB: Jun 15, 1945, 78 y.o.  MRN:  [id number]     02/01/2023 05/11/2023 06/01/2023  Outreach Medication Two  Initial Outreach Date (Medication Two) 01/31/2023    Manufacturer Medication Two [employer]i    [employer] [facility] Lantus    Dose of Lantus 100u/ml    Type of Radiographer, therapeutic Assistance    Date Application Sent to Patient 01/31/2023    Application Items Requested Application;Proof of Income;Other    Date Application Sent to Prescriber 02/02/2023    Name of Prescriber Ronny Bacon    Date Application Received From Patient  05/11/2023   Application Items Received From Patient  Application;Proof of Income;Other   Date Application Received From Provider  04/16/2023   Method Application Sent to Manufacturer  Fax   Date Application Submitted to Manufacturer  05/11/2023   Patient Assistance Determination   Approved  Approval Start Date   05/25/2023  Patient Notification Method   Telephone Call  Telephone Call Outcome   Successful   Care coordination call placed to [employer] in regard to Lantus application.  Spoke to [place] who informs patient is APPROVED 05/25/23-03/19/24. She informs initial order and subsequent refill orders will process automatically and be delivered to the prescriber's office. She informs the first shipment should arrive in the next 7-10 business days. She informs patient can call [employer] at [phone] to check on shipments.  Successful outreach to patient. HIPAA verified. Patient was informed of his approval, refill procedure and expected delivery date of medication.  Will route message to [facility] PharmD Fayette Pho notifying him of this information as well as for case closure.  Pattricia Boss, CPhT [place]  Office: [phone] Fax: [phone] Email: [email or web address]

## 2023-06-26 ENCOUNTER — Telehealth: Payer: Self-pay

## 2023-06-26 NOTE — Progress Notes (Signed)
   06/26/2023  Patient ID: Ronald Heath, male   DOB: 14-May-1945, 78 y.o.   MRN: [id number]   Patient appeared on insurance report for not passing the quality metrics in 2024:  Medication Adherence for Cholesterol (MAC) Medication Adherence for Diabetes (MAD)   Outreach to the patient was Successful.   Meds Tracking:  -Atorvastatin 20 mg - Last fill 90DS on 04/27/23, LDL 47 on 02/14/23. Does not qualify for metric yet, next fill due 07/26/23.  -Metformin ER 500 mg - Last fill 90DS on 08/23/23, dose was reduced to 1 tablet twice daily which explains the gap in fill history. Now filling at [facility]. A1C 7.4% on 02/14/23, managed by endocrinology but PCP has been prescribing all meds. Next fill overdue, no remaining refills at pharmacy.  -Gets Farxiga, Lantus, and Ozempic via PAP, since insulin not being billed through insurance he will not be excluded from MAD.  Plan:  Discussed PAP and adherence with Bill, does not have refills for atorvastatin or metformin at [facility]. Will coordinate with PCP to change metformin dose to 1 tablet twice daily and send refills for atorvastatin. Next fill history review on 07/30/23.   Fayette Pho, PharmD

## 2023-07-06 ENCOUNTER — Ambulatory Visit: Payer: PPO | Admitting: Cardiovascular Disease

## 2023-07-11 ENCOUNTER — Encounter: Payer: Self-pay | Admitting: Cardiovascular Disease

## 2023-07-11 NOTE — Progress Notes (Unsigned)
 Cardiology Office Note:    Date:  07/12/2023   ID:  Ronald Heath Aug 09, 1945, MRN [id number]  PCP:  Omie Bickers, MD   [facility] Providers Cardiologist:   Quanna Wittke     Referring MD: Omie Bickers, MD   Chief Complaint  Patient presents with   LBBB    History of Present Illness:    Ronald Heath is a 78 y.o. male with a hx of left bundle branch block, premature ventricular contractions, heart murmur.  We are asked to see him today by Dr. Del Favia for further evaluation of his heart murmur and left left bundle branch block.  No CP  No hx of exercise  Did accounting work in the past  No yard work , limited because of knee issues  Has seen ortho   Hx of obesity Peak weight was 454 lbs  Now is 389   He had a heart catheterization many years ago.  Heart cath was reportedly normal.   July 11, 2022  Ronald Heath is seen for follow up of his CAD, LBBB , morbid obesity Wt is 357 lbs ( down 32 lbs)   on ozympic  No CP , no dyspnea. 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   40-45%, mod conc LVH   Hyperlipidemia    Hypertension    Hypothyroidism    Shortness of breath    with exertion   Sleep apnea    pt is supposed to wear CPAP, but doesn't wear it    Past Surgical History:  Procedure Laterality Date   AMPUTATION Left 01/01/2019   Procedure:  AMPUTATION LEFT INDEX FINGER;  Surgeon: Rober Chimera, MD;  Location: [facility] OR;  Service: Orthopedics;  Laterality: Left;   CATARACT EXTRACTION W/PHACO Right 11/21/2012   Procedure: CATARACT EXTRACTION PHACO AND INTRAOCULAR LENS PLACEMENT (IOC);  Surgeon: Anner Kill, MD;  Location: [facility] ORS;  Service: Ophthalmology;  Laterality: Right;  CDE:19.67   CATARACT EXTRACTION W/PHACO Left 12/19/2012   Procedure: CATARACT EXTRACTION PHACO AND INTRAOCULAR LENS PLACEMENT (IOC);  Surgeon: Anner Kill, MD;  Location: [facility];  Service: Ophthalmology;  Laterality: Left;  CDE:8.00   KNEE ARTHROSCOPY Left    ROTATOR CUFF REPAIR Right     Current Medications: Current Meds  Medication Sig   albuterol (VENTOLIN HFA) 108 (90 Base) MCG/ACT inhaler 2 Puff(s) By Mouth Every 4 Hours PRN   aspirin  EC 81 MG tablet Take 81 mg by mouth daily.   atorvastatin  (LIPITOR) 20 MG tablet Take 20 mg by mouth daily.   azelastine (ASTELIN) 0.1 % nasal spray Place 2 sprays into both nostrils 2 (two) times daily.   carvedilol  (COREG ) 3.125 MG tablet Take 1 tablet (3.125 mg total) by mouth 2 (two) times daily.   dapagliflozin  propanediol (FARXIGA ) 10 MG TABS tablet  Take 1 tablet (10 mg total) by mouth daily.   glucose blood test strip 1 each by Other route 4 (four) times daily. Use as instructed   hydrALAZINE  (APRESOLINE ) 50 MG tablet Take 50 mg by mouth 3 (three) times daily.   insulin  glargine (LANTUS ) 100 UNIT/ML injection Inject 50 Units into the skin daily.   levothyroxine  (SYNTHROID , LEVOTHROID) 137 MCG tablet Take 137 mcg by mouth daily before breakfast.   metFORMIN (GLUCOPHAGE-XR) 500 MG 24 hr tablet Take 500 mg by mouth 2 (two) times daily with a meal.   OneTouch Delica Lancets 30G MISC 1 each by Does not apply route 4 (four) times daily.   PARoxetine  (PAXIL ) 40 MG tablet Take 20 mg by mouth every morning.   pramipexole  (MIRAPEX ) 1 MG tablet Take 1 mg by mouth at bedtime.   Semaglutide , 2 MG/DOSE, 8 MG/3ML SOPN Inject 2 mg as  directed once a week.   vitamin B-12 (CYANOCOBALAMIN ) 1000 MCG tablet Take 1,000 mcg by mouth daily.   [DISCONTINUED] sacubitril -valsartan  (ENTRESTO ) 97-103 MG Take 1 tablet by mouth 2 (two) times daily.     Allergies:   Patient has no known allergies.   Social History   Socioeconomic History   Marital status: Widowed    Spouse name: Not on file   Number of children: Not on file   Years of education: Not on file   Highest education level: Not on file  Occupational History   Not on file  Tobacco Use   Smoking status: Former    Current packs/day: 2.00    Average packs/day: 2.0 packs/day for 15.0 years (30.0 ttl pk-yrs)    Types: Cigarettes   Smokeless tobacco: Never  Vaping Use   Vaping status: Never Used  Substance and Sexual Activity   Alcohol use: Yes    Alcohol/week: 2.0 standard drinks of alcohol    Types: 2 Cans of beer per week   Drug use: No   Sexual activity: Not on file  Other Topics Concern   Not on file  Social History Narrative   Not on file   Social Drivers of Health   Financial Resource Strain: Not on file  Food Insecurity: Not on file  Transportation Needs: Not on file  Physical Activity: Not on file  Stress: Not on file  Social Connections: Not on file     Family History: The patient's family history includes Diabetes Mellitus II in his father; Stroke in his father.  ROS:   Please see the history of present illness.     All other systems reviewed and are negative.  EKGs/Labs/Other Studies Reviewed:    The following studies were reviewed today:   EKG:    EKG Interpretation Date/Time:  Thursday July 12 2023 11:43:36 EDT Ventricular Rate:  73 PR Interval:  194 QRS Duration:  164 QT Interval:  422 QTC Calculation: 464 R Axis:   -47  Text Interpretation: Normal sinus rhythm Left axis deviation Left bundle branch block When compared with ECG of 30-Dec-2018 22:01, No significant change since last tracing Confirmed by Ahmad Alert ([id number])  on 07/12/2023 11:50:03 AM    Recent Labs: 12/14/2022: ALT 14; BUN 21; Creatinine, Ser 1.37; Potassium 5.2; Sodium 139; TSH 1.200  Recent Lipid Panel    Component Value Date/Time   CHOL 91 (L) 08/11/2021 0825   TRIG 59 08/11/2021 0825   HDL 41 08/11/2021 0825   CHOLHDL 2.2 08/11/2021 0825   LDLCALC 36 08/11/2021 0825     Risk Assessment/Calculations:  Physical Exam:    Physical Exam: Blood pressure 126/62, pulse 70, height 6\' 1"  (1.854 m), weight (!) 312 lb 6.4 oz (141.7 kg), SpO2 96%.       GEN:  Well nourished, well developed in no acute distress HEENT: Normal NECK: No JVD; radiation of systolic murmur up into carotids  LYMPHATICS: No lymphadenopathy CARDIAC: RRR 2/6 systolic murmur  RESPIRATORY:  Clear to auscultation without rales, wheezing or rhonchi  ABDOMEN: Soft, non-tender, non-distended MUSCULOSKELETAL:  No edema; No deformity  SKIN: Warm and dry NEUROLOGIC:  Alert and oriented x 3     ASSESSMENT:    1. Follow-up exam      PLAN:       Left bundle branch block:   stable   2.  Cardiac murmur:   Echocardiogram from January, 2024 reveals aortic sclerosis but no significant aortic stenosis.  3.  Carotid bruits:   Carotid duplex scan from April, 2023 reveals mild bilateral carotid artery disease.  Will recheck carotid duplex scan.  4.  Obesity:    is losing weight   5.  Premature ventricular contractions:    6.  Possible knee replacement surgery: Ronald Heath is at low risk from a cardiac standpoint for his upcoming possible knee replacements.  He needs to lose a bit more weight but then it appears that he will be a candidate to have bilateral knee replacement.  He is not having any signs or symptoms of congestive heart failure.  He does not have significant aortic stenosis by echo.  He is not currently on aspirin  or Plavix.  He is on Ozempic  and he will need to hold that a week prior to any surgical procedures.    Medication Adjustments/Labs and  Tests Ordered: Current medicines are reviewed at length with the patient today.  Concerns regarding medicines are outlined above.  Orders Placed This Encounter  Procedures   EKG 12-Lead   VAS US  CAROTID   Meds ordered this encounter  Medications   sacubitril -valsartan  (ENTRESTO ) 97-103 MG    Sig: Take 1 tablet by mouth 2 (two) times daily.    Dispense:  180 tablet    Refill:  3       Patient Instructions  Medication Instructions:   *If you need a refill on your cardiac medications before your next appointment, please call your pharmacy*  Lab Work:  If you have labs (blood work) drawn today and your tests are completely normal, you will receive your results only by: MyChart Message (if you have MyChart) OR A paper copy in the mail If you have any lab test that is abnormal or we need to change your treatment, we will call you to review the results.  Testing/Procedures: Your physician has requested that you have a carotid duplex. This test is an ultrasound of the carotid arteries in your neck. It looks at blood flow through these arteries that supply the brain with blood. Allow one hour for this exam. There are no restrictions or special instructions.   Follow-Up: At [facility], you and your health needs are our priority.  As part of our continuing mission to provide you with exceptional heart care, our providers are all part of one team.  This team includes your primary Cardiologist (physician) and Advanced Practice Providers or APPs (Physician Assistants and Nurse Practitioners) who all work together to provide you with the care you need, when you need it.  Your next appointment:  one year with General cardiology We recommend signing up  for the patient portal called "MyChart".  Sign up information is provided on this After Visit Summary.  MyChart is used to connect with patients for Virtual Visits (Telemedicine).  Patients are able to view lab/test results, encounter  notes, upcoming appointments, etc.  Non-urgent messages can be sent to your provider as well.   To learn more about what you can do with MyChart, go to [email or web address].   Other Instructions       1st Floor: - Lobby - Registration  - Pharmacy  - Lab - Cafe  2nd Floor: - PV Lab - Diagnostic Testing (echo, CT, nuclear med)  3rd Floor: - Vacant  4th Floor: - TCTS (cardiothoracic surgery) - AFib Clinic - Structural Heart Clinic - Vascular Surgery  - Vascular Ultrasound  5th Floor: - HeartCare Cardiology (general and EP) - Clinical Pharmacy for coumadin, hypertension, lipid, weight-loss medications, and med management appointments    Valet parking services will be available as well.      Signed, Ahmad Alert, MD  07/12/2023 3:41 PM    [facility]

## 2023-07-12 ENCOUNTER — Ambulatory Visit: Attending: Cardiovascular Disease | Admitting: Cardiovascular Disease

## 2023-07-12 ENCOUNTER — Encounter: Payer: Self-pay | Admitting: Cardiovascular Disease

## 2023-07-12 VITALS — BP 126/62 | HR 70 | Ht 73.0 in | Wt 312.4 lb

## 2023-07-12 DIAGNOSIS — R011 Cardiac murmur, unspecified: Secondary | ICD-10-CM | POA: Diagnosis not present

## 2023-07-12 DIAGNOSIS — Z09 Encounter for follow-up examination after completed treatment for conditions other than malignant neoplasm: Secondary | ICD-10-CM | POA: Diagnosis not present

## 2023-07-12 DIAGNOSIS — R0989 Other specified symptoms and signs involving the circulatory and respiratory systems: Secondary | ICD-10-CM | POA: Diagnosis not present

## 2023-07-12 DIAGNOSIS — I447 Left bundle-branch block, unspecified: Secondary | ICD-10-CM

## 2023-07-12 MED ORDER — SACUBITRIL-VALSARTAN 97-103 MG PO TABS: 1.0000 | ORAL_TABLET | Freq: Two times a day (BID) | ORAL | 3 refills | Status: AC

## 2023-07-12 NOTE — Patient Instructions (Signed)
 Medication Instructions:   *If you need a refill on your cardiac medications before your next appointment, please call your pharmacy*  Lab Work:  If you have labs (blood work) drawn today and your tests are completely normal, you will receive your results only by: MyChart Message (if you have MyChart) OR A paper copy in the mail If you have any lab test that is abnormal or we need to change your treatment, we will call you to review the results.  Testing/Procedures: Your physician has requested that you have a carotid duplex. This test is an ultrasound of the carotid arteries in your neck. It looks at blood flow through these arteries that supply the brain with blood. Allow one hour for this exam. There are no restrictions or special instructions.   Follow-Up: At [facility], you and your health needs are our priority.  As part of our continuing mission to provide you with exceptional heart care, our providers are all part of one team.  This team includes your primary Cardiologist (physician) and Advanced Practice Providers or APPs (Physician Assistants and Nurse Practitioners) who all work together to provide you with the care you need, when you need it.  Your next appointment:  one year with General cardiology We recommend signing up for the patient portal called "MyChart".  Sign up information is provided on this After Visit Summary.  MyChart is used to connect with patients for Virtual Visits (Telemedicine).  Patients are able to view lab/test results, encounter notes, upcoming appointments, etc.  Non-urgent messages can be sent to your provider as well.   To learn more about what you can do with MyChart, go to [email or web address].   Other Instructions       1st Floor: - Lobby - Registration  - Pharmacy  - Lab - Cafe  2nd Floor: - PV Lab - Diagnostic Testing (echo, CT, nuclear med)  3rd Floor: - Vacant  4th Floor: - TCTS (cardiothoracic surgery) - AFib  Clinic - Structural Heart Clinic - Vascular Surgery  - Vascular Ultrasound  5th Floor: - HeartCare Cardiology (general and EP) - Clinical Pharmacy for coumadin, hypertension, lipid, weight-loss medications, and med management appointments    Valet parking services will be available as well.

## 2023-07-18 DIAGNOSIS — E1165 Type 2 diabetes mellitus with hyperglycemia: Secondary | ICD-10-CM | POA: Diagnosis not present

## 2023-07-18 DIAGNOSIS — I10 Essential (primary) hypertension: Secondary | ICD-10-CM | POA: Diagnosis not present

## 2023-07-18 DIAGNOSIS — E039 Hypothyroidism, unspecified: Secondary | ICD-10-CM | POA: Diagnosis not present

## 2023-07-18 DIAGNOSIS — D519 Vitamin B12 deficiency anemia, unspecified: Secondary | ICD-10-CM | POA: Diagnosis not present

## 2023-07-25 DIAGNOSIS — R0600 Dyspnea, unspecified: Secondary | ICD-10-CM | POA: Diagnosis not present

## 2023-07-25 DIAGNOSIS — E039 Hypothyroidism, unspecified: Secondary | ICD-10-CM | POA: Diagnosis not present

## 2023-07-25 DIAGNOSIS — N1831 Chronic kidney disease, stage 3a: Secondary | ICD-10-CM | POA: Diagnosis not present

## 2023-07-25 DIAGNOSIS — E1122 Type 2 diabetes mellitus with diabetic chronic kidney disease: Secondary | ICD-10-CM | POA: Diagnosis not present

## 2023-07-25 DIAGNOSIS — F331 Major depressive disorder, recurrent, moderate: Secondary | ICD-10-CM | POA: Diagnosis not present

## 2023-07-25 DIAGNOSIS — I10 Essential (primary) hypertension: Secondary | ICD-10-CM | POA: Diagnosis not present

## 2023-07-25 DIAGNOSIS — E782 Mixed hyperlipidemia: Secondary | ICD-10-CM | POA: Diagnosis not present

## 2023-07-25 DIAGNOSIS — E1165 Type 2 diabetes mellitus with hyperglycemia: Secondary | ICD-10-CM | POA: Diagnosis not present

## 2023-07-25 DIAGNOSIS — I130 Hypertensive heart and chronic kidney disease with heart failure and stage 1 through stage 4 chronic kidney disease, or unspecified chronic kidney disease: Secondary | ICD-10-CM | POA: Diagnosis not present

## 2023-07-25 DIAGNOSIS — I509 Heart failure, unspecified: Secondary | ICD-10-CM | POA: Diagnosis not present

## 2023-08-01 ENCOUNTER — Other Ambulatory Visit: Payer: Self-pay | Admitting: Cardiovascular Disease

## 2023-08-07 ENCOUNTER — Ambulatory Visit (HOSPITAL_COMMUNITY)
Admission: RE | Admit: 2023-08-07 | Discharge: 2023-08-07 | Disposition: A | Discharge status: Home / Self Care | Source: Ambulatory Visit | Attending: Cardiovascular Disease | Admitting: Cardiovascular Disease

## 2023-08-07 ENCOUNTER — Ambulatory Visit: Payer: Self-pay | Admitting: Internal Medicine

## 2023-08-07 DIAGNOSIS — Z09 Encounter for follow-up examination after completed treatment for conditions other than malignant neoplasm: Secondary | ICD-10-CM | POA: Insufficient documentation

## 2023-08-29 ENCOUNTER — Other Ambulatory Visit: Payer: Self-pay | Admitting: Cardiovascular Disease

## 2023-09-05 ENCOUNTER — Ambulatory Visit: Payer: PPO | Admitting: Nurse Practitioner

## 2023-09-05 ENCOUNTER — Ambulatory Visit: Admitting: Nurse Practitioner

## 2023-09-05 ENCOUNTER — Encounter: Payer: Self-pay | Admitting: Nurse Practitioner

## 2023-09-05 VITALS — BP 138/68 | HR 89 | Ht 73.0 in | Wt 350.6 lb

## 2023-09-05 DIAGNOSIS — I10 Essential (primary) hypertension: Secondary | ICD-10-CM | POA: Diagnosis not present

## 2023-09-05 DIAGNOSIS — Z7985 Long-term (current) use of injectable non-insulin antidiabetic drugs: Secondary | ICD-10-CM | POA: Diagnosis not present

## 2023-09-05 DIAGNOSIS — E119 Type 2 diabetes mellitus without complications: Secondary | ICD-10-CM

## 2023-09-05 DIAGNOSIS — E782 Mixed hyperlipidemia: Secondary | ICD-10-CM | POA: Diagnosis not present

## 2023-09-05 DIAGNOSIS — Z794 Long term (current) use of insulin: Secondary | ICD-10-CM

## 2023-09-05 DIAGNOSIS — E039 Hypothyroidism, unspecified: Secondary | ICD-10-CM

## 2023-09-05 DIAGNOSIS — Z7984 Long term (current) use of oral hypoglycemic drugs: Secondary | ICD-10-CM | POA: Diagnosis not present

## 2023-09-05 NOTE — Progress Notes (Signed)
 Endocrinology Follow Up Note       09/05/2023, 4:12 PM   Subjective:    Patient ID: Ronald Heath, male    DOB: 1945-09-09.  Ronald Heath is being seen in follow up after being seen in consultation for management of currently uncontrolled symptomatic diabetes requested by  Omie Bickers, MD.  He is transferring care from his previous Endocrinologist Dr. Christobal Craft who retired.   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 OR;  Service: Orthopedics;  Laterality: Left;   CATARACT EXTRACTION W/PHACO Right 11/21/2012   Procedure: CATARACT EXTRACTION PHACO AND INTRAOCULAR LENS PLACEMENT (IOC);  Surgeon: Anner Kill, MD;  Location: [facility] ORS;  Service: Ophthalmology;  Laterality: Right;  CDE:19.67   CATARACT EXTRACTION W/PHACO Left 12/19/2012   Procedure: CATARACT EXTRACTION PHACO AND INTRAOCULAR LENS PLACEMENT (IOC);  Surgeon: Anner Kill, MD;  Location: [facility];  Service: Ophthalmology;  Laterality: Left;  CDE:8.00   KNEE ARTHROSCOPY Left    ROTATOR CUFF REPAIR Right     Social History   Socioeconomic History   Marital status: Widowed    Spouse name: Not on file   Number of children: Not on file    Years of education: Not on file   Highest education level: Not on file  Occupational History   Not on file  Tobacco Use   Smoking status: Former    Current packs/day: 2.00    Average packs/day: 2.0 packs/day for 15.0 years (30.0 ttl pk-yrs)    Types: Cigarettes   Smokeless tobacco: Never  Vaping Use   Vaping status: Never Used  Substance and Sexual Activity   Alcohol use: Yes    Alcohol/week: 2.0 standard drinks of alcohol    Types: 2 Cans of beer per week   Drug use: No   Sexual activity: Not on file  Other Topics Concern   Not on file  Social History Narrative   Not on file   Social Drivers of Health   Financial Resource Strain: Not on file  Food Insecurity: Not on file  Transportation Needs: Not on file  Physical Activity: Not on file  Stress: Not on file  Social Connections: Not on file    Family History  Problem Relation Age of Onset   Diabetes Mellitus II Father    Stroke Father     Outpatient Encounter Medications as of 09/05/2023  Medication  Sig   albuterol (VENTOLIN HFA) 108 (90 Base) MCG/ACT inhaler 2 Puff(s) By Mouth Every 4 Hours PRN   aspirin  EC 81 MG tablet Take 81 mg by mouth daily.   atorvastatin  (LIPITOR) 20 MG tablet Take 20 mg by mouth daily.   azelastine (ASTELIN) 0.1 % nasal spray Place 2 sprays into both nostrils 2 (two) times daily.   carvedilol  (COREG ) 3.125 MG tablet TAKE ONE TABLET BY MOUTH TWICE DAILY.   dapagliflozin  propanediol (FARXIGA ) 10 MG TABS tablet Take 1 tablet (10 mg total) by mouth daily.   glucose blood test strip 1 each by Other route 4 (four) times daily. Use as instructed   hydrALAZINE  (APRESOLINE ) 50 MG tablet Take 50 mg by mouth 3 (three) times daily.   insulin  glargine (LANTUS ) 100 UNIT/ML injection Inject 50 Units into the skin daily.   levothyroxine  (SYNTHROID , LEVOTHROID) 137 MCG tablet Take 137 mcg by mouth daily before breakfast.   metFORMIN (GLUCOPHAGE-XR) 500 MG 24 hr tablet Take 500 mg by mouth 2 (two) times  daily with a meal.   OneTouch Delica Lancets 30G MISC 1 each by Does not apply route 4 (four) times daily.   PARoxetine  (PAXIL ) 40 MG tablet Take 20 mg by mouth every morning.   pramipexole  (MIRAPEX ) 1 MG tablet Take 1 mg by mouth at bedtime.   sacubitril -valsartan  (ENTRESTO ) 97-103 MG Take 1 tablet by mouth 2 (two) times daily.   Semaglutide , 2 MG/DOSE, 8 MG/3ML SOPN Inject 2 mg as directed once a week.   vitamin B-12 (CYANOCOBALAMIN ) 1000 MCG tablet Take 1,000 mcg by mouth daily.   No facility-administered encounter medications on file as of 09/05/2023.    ALLERGIES: No Known Allergies  VACCINATION STATUS: Immunization History  Administered Date(s) Administered   Influenza, High Dose Seasonal PF 01/16/2018   Influenza-Unspecified 12/19/2019   Moderna Sars-Covid-2 Vaccination 05/31/2019, 06/21/2019, 02/19/2020   Tdap 12/30/2018    Diabetes He presents for his follow-up diabetic visit. He has type 2 diabetes mellitus. Onset time: Diagnosed at approx age of 71. His disease course has been stable. There are no hypoglycemic associated symptoms. Pertinent negatives for hypoglycemia include no nervousness/anxiousness or tremors. Associated symptoms include foot paresthesias. Pertinent negatives for diabetes include no fatigue, no polydipsia, no polyuria and no weight loss. There are no hypoglycemic complications. Symptoms are stable. Diabetic complications include nephropathy, peripheral neuropathy and retinopathy. Risk factors for coronary artery disease include diabetes mellitus, dyslipidemia, family history, hypertension, male sex, obesity and sedentary lifestyle. Current diabetic treatment includes intensive insulin  program and oral agent (dual therapy) (and Ozempic ). He is compliant with treatment most of the time. His weight is fluctuating minimally. He is following a generally healthy diet. When asked about meal planning, he reported none. He has not had a previous visit with a dietitian. He  never participates in exercise. His home blood glucose trend is increasing steadily. His breakfast blood glucose range is generally 130-140 mg/dl. (He presents today with his meter showing slightly above target fasting glycemic profile.  His most recent A1c, checked on 4/30 was 7.4%, unchanged from previous visit.  There have been no changes in his medications.  Analysis of his meter shows 7-day average of 123, 14-day average of 123, 30-day average of 112, 90-day average of 110.  He does note he has been eating later in the evenings recently.  He also has had several tick bites, just pulled one off recently.  He has been on the 2 mg of Ozempic  for only a short period  of time, tolerating it well. ) An ACE inhibitor/angiotensin II receptor blocker is being taken. He sees a podiatrist.Eye exam is current.  Hyperlipidemia This is a chronic problem. The current episode started more than 1 year ago. The problem is controlled. Recent lipid tests were reviewed and are normal. Exacerbating diseases include chronic renal disease, diabetes, hypothyroidism and obesity. Factors aggravating his hyperlipidemia include fatty foods and thiazides. Current antihyperlipidemic treatment includes statins. The current treatment provides significant improvement of lipids. Compliance problems include adherence to diet and adherence to exercise.  Risk factors for coronary artery disease include diabetes mellitus, dyslipidemia, hypertension, male sex, obesity and a sedentary lifestyle.  Hypertension This is a chronic problem. The current episode started more than 1 year ago. The problem has been gradually improving since onset. The problem is controlled. Associated symptoms include peripheral edema. Pertinent negatives include no palpitations. Agents associated with hypertension include thyroid  hormones. Risk factors for coronary artery disease include diabetes mellitus, dyslipidemia, family history, obesity, male gender and sedentary  lifestyle. Past treatments include calcium  channel blockers, diuretics and angiotensin blockers. The current treatment provides mild improvement. Compliance problems include diet and exercise.  Hypertensive end-organ damage includes kidney disease and retinopathy. Identifiable causes of hypertension include chronic renal disease and a thyroid  problem.  Thyroid  Problem Presents for follow-up visit. Patient reports no anxiety, cold intolerance, constipation, depressed mood, fatigue, heat intolerance, leg swelling, palpitations, tremors, weight gain or weight loss. The symptoms have been stable. His past medical history is significant for diabetes and hyperlipidemia.     Review of systems  Constitutional: +decreasing body weight, current Body mass index is 46.26 kg/m.,  no subjective hyperthermia, no subjective hypothermia Eyes: no blurry vision, no xerophthalmia ENT: no sore throat, no nodules palpated in throat, no dysphagia/odynophagia, no hoarseness Cardiovascular: no chest pain, no shortness of breath, no palpitations, + ankle swelling Respiratory: no cough, no shortness of breath Gastrointestinal: no nausea/vomiting/diarrhea Musculoskeletal: right knee pain (needs TKR) Skin: no rashes, no hyperemia Neurological: no tremors, no numbness, no tingling, no dizziness Psychiatric: no depression, no anxiety  Objective:     BP 138/68 (BP Location: Left Arm, Patient Position: Sitting, Cuff Size: Large)   Pulse 89   Ht 6' 1 (1.854 m)   Wt (!) 350 lb 9.6 oz (159 kg)   BMI 46.26 kg/m   Wt Readings from Last 3 Encounters:  09/05/23 (!) 350 lb 9.6 oz (159 kg)  07/12/23 (!) 312 lb 6.4 oz (141.7 kg)  05/08/23 (!) 369 lb 12.8 oz (167.7 kg)     BP Readings from Last 3 Encounters:  09/05/23 138/68  07/12/23 126/62  05/08/23 (!) 148/68      Physical Exam- Limited  Constitutional:  Body mass index is 46.26 kg/m. , not in acute distress, normal state of mind Eyes:  EOMI, no  exophthalmos Musculoskeletal: no gross deformities, strength intact in all four extremities, no gross restriction of joint movements Skin:  no rashes, no hyperemia, small area of erythema to right side abdomen from recent tick bite- no bullseye rash, does not appear to be infected Neurological: no tremor with outstretched hands   Diabetic Foot Exam - Simple   No data filed      CMP ( most recent) CMP     Component Value Date/Time   NA 139 12/14/2022 0957   K 5.2 12/14/2022 0957   CL 103 12/14/2022 0957   CO2 24 12/14/2022 0957   GLUCOSE 96 12/14/2022 0957   GLUCOSE 131 (H) 05/24/2022 1226  BUN 21 12/14/2022 0957   CREATININE 1.37 (H) 12/14/2022 0957   CREATININE 1.19 (H) 11/03/2019 1035   CALCIUM  9.4 12/14/2022 0957   CALCIUM  8.4 10/13/2021 0000   PROT 6.5 12/14/2022 0957   ALBUMIN 3.7 (L) 12/14/2022 0957   AST 17 12/14/2022 0957   ALT 14 12/14/2022 0957   ALKPHOS 71 12/14/2022 0957   BILITOT 0.3 12/14/2022 0957   GFRNONAA >60 05/24/2022 1226   GFRNONAA 60 11/03/2019 1035   GFRAA 70 11/03/2019 1035     Diabetic Labs (most recent): Lab Results  Component Value Date   HGBA1C 7.2 (A) 12/21/2022   HGBA1C 7.0 (A) 12/13/2021   HGBA1C 7.6 09/08/2021     Lipid Panel ( most recent) Lipid Panel     Component Value Date/Time   CHOL 91 (L) 08/11/2021 0825   TRIG 59 08/11/2021 0825   HDL 41 08/11/2021 0825   CHOLHDL 2.2 08/11/2021 0825   LDLCALC 36 08/11/2021 0825   LABVLDL 14 08/11/2021 0825      Lab Results  Component Value Date   TSH 1.200 12/14/2022   TSH 1.36 10/13/2021   TSH 1.340 08/11/2021   TSH 1.440 01/03/2021   FREET4 1.51 12/14/2022   FREET4 1.42 10/13/2021   FREET4 1.46 08/11/2021   FREET4 1.27 01/03/2021           Assessment & Plan:   1) Diabetes mellitus without complication (HCC)  He presents today with his meter showing slightly above target fasting glycemic profile.  His most recent A1c, checked on 4/30 was 7.4%, unchanged from  previous visit.  There have been no changes in his medications.  Analysis of his meter shows 7-day average of 123, 14-day average of 123, 30-day average of 112, 90-day average of 110.  He does note he has been eating later in the evenings recently.  He also has had several tick bites, just pulled one off recently.  He has been on the 2 mg of Ozempic  for only a short period of time, tolerating it well.  - DOMANIK RAINVILLE has currently uncontrolled symptomatic type 2 DM since 78 years of age.  -Recent labs reviewed.  - I had a long discussion with him about the progressive nature of diabetes and the pathology behind its complications. 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 These are all discussed in detail with him.  - Nutritional counseling repeated at each appointment due to patients tendency to fall back in to old habits.  - The patient admits there is a room for improvement in their diet and drink choices. -  Suggestion is made for the patient to avoid simple carbohydrates from their diet including Cakes, Sweet Desserts / Pastries, Ice Cream, Soda (diet and regular), Sweet Tea, Candies, Chips, Cookies, Sweet Pastries, Store Bought Juices, Alcohol in Excess of 1-2 drinks a day, Artificial Sweeteners, Coffee Creamer, and Sugar-free Products. This will help patient to have stable blood glucose profile and potentially avoid unintended weight gain.   - I encouraged the patient to switch to unprocessed or minimally processed complex starch and increased protein intake (animal or plant source), fruits, and vegetables.   - Patient is advised to stick to a routine mealtimes to eat 3 meals a day and avoid unnecessary snacks (to snack only to correct hypoglycemia).  The following Lifestyle Medicine recommendations according to [facility] of Lifestyle  Medicine  [facility]) were discussed and and offered to patient and he  agrees to start the journey:  A. Whole Foods, Plant-Based Nutrition comprising of fruits and vegetables, plant-based proteins, whole-grain carbohydrates was discussed in detail with the patient.   A list for source of those nutrients were also provided to the patient.  Patient will use only water  or unsweetened tea for hydration. B.  The need to stay away from risky substances including alcohol, smoking; obtaining 7 to 9 hours of restorative sleep, at least 150 minutes of moderate intensity exercise weekly, the importance of healthy social connections,  and stress management techniques were discussed. C.  A full color page of  Calorie density of various food groups per pound showing examples of each food groups was provided to the patient.  - I have approached him with the following individualized plan to manage his diabetes and patient agrees:   -He is advised to continue his Lantus  50 units SQ nightly, Ozempic  2 mg SQ weekly, Farxiga  10 mg po daily and Metformin 500 mg po twice daily.  We did talk about possibly increasing his Lantus  to get morning readings closer to goal, but he would like to try eating supper a bit earlier in the evening to see if that corrects it first.  -he is encouraged to continue monitoring glucose 4 times daily, before meals and before bed, and to call the clinic if he has readings less than 70 or greater than 200 for 3 tests in a row.  He could greatly benefit from CGM device, especially since he has random hypoglycemia.  He received his CGM from [employer], but has not started wearing it, prefers to stick to traditional fingersticks.   - he is warned not to take insulin  without proper monitoring per orders.  - Adjustment parameters are given to him for hypo and hyperglycemia in writing.  - Specific targets for  A1c; LDL, HDL, and Triglycerides were discussed with the patient.  2) Blood Pressure  /Hypertension: -His blood pressure is not controlled to target but is stable.  he is advised to continue his current medications including Norvasc  10 mg p.o. daily with breakfast, Hydralazine  50 mg po TID, and Olmesartan 40 mg po daily.   3) Lipids/Hyperlipidemia:    Review of his recent lipid panel from 02/23/22 showed controlled LDL at 38.  He is advised to continue Lipitor 20 mg p.o. daily at bedtime.  Side effects and precautions discussed with him.  4)  Weight/Diet:  his Body mass index is 46.26 kg/m.  -  clearly complicating his diabetes care.  he is a candidate for weight loss. I discussed with him the fact that loss of 5 - 10% of his  current body weight will have the most impact on his diabetes management.  Exercise, and detailed carbohydrates information provided  -  detailed on discharge instructions.    5) Hypothyroidism- unspecified Most recent TFTs are consistent with appropriate hormone replacement.  He is advised to continue Levothyroxine  137 mcg p.o. daily before breakfast.    - The correct intake of thyroid  hormone (Levothyroxine , Synthroid ), is on empty stomach first thing in the morning, with water , separated by at least 30 minutes from breakfast and other medications,  and separated by more than 4 hours from calcium , iron, multivitamins, acid reflux medications (PPIs).  - This medication is a life-long medication and will be needed to correct thyroid  hormone imbalances for the rest of your life.  The dose may change from  time to time, based on thyroid  blood work.  - It is extremely important to be consistent taking this medication, near the same time each morning.  -AVOID TAKING PRODUCTS CONTAINING BIOTIN (commonly found in Hair, Skin, Nails vitamins) AS IT INTERFERES WITH THE VALIDITY OF THYROID  FUNCTION BLOOD TESTS.  6) Chronic Care/Health Maintenance: -he is on ACEI/ARB and Statin medications and is encouraged to initiate and continue to follow up with Ophthalmology,  Dentist, Podiatrist at least yearly or according to recommendations, and advised to stay away from smoking. I have recommended yearly flu vaccine and pneumonia vaccine at least every 5 years; moderate intensity exercise for up to 150 minutes weekly; and sleep for at least 7 hours a day.  - he is advised to maintain close follow up with Omie Bickers, MD for primary care needs, as well as his other providers for optimal and coordinated care.    I spent  40  minutes in the care of the patient today including review of labs from CMP, Lipids, Thyroid  Function, Hematology (current and previous including abstractions from other facilities); face-to-face time discussing  his blood glucose readings/logs, discussing hypoglycemia and hyperglycemia episodes and symptoms, medications doses, his options of short and long term treatment based on the latest standards of care / guidelines;  discussion about incorporating lifestyle medicine;  and documenting the encounter. Risk reduction counseling performed per USPSTF guidelines to reduce obesity and cardiovascular risk factors.     Please refer to Patient Instructions for Blood Glucose Monitoring and Insulin /Medications Dosing Guide  in media tab for additional information. Please  also refer to  Patient Self Inventory in the Media  tab for reviewed elements of pertinent patient history.  Rudell Corrigan participated in the discussions, expressed understanding, and voiced agreement with the above plans.  All questions were answered to his satisfaction. he is encouraged to contact clinic should he have any questions or concerns prior to his return visit.   Follow up plan: - Return in about 4 months (around 01/05/2024) for Diabetes F/U with A1c in office, No previsit labs, Bring meter and logs.   Hulon Magic, [facility] [address] [place], [place] [address] Phone: [phone] Fax: [phone]  09/05/2023, 4:12  PM

## 2023-09-28 ENCOUNTER — Telehealth: Payer: Self-pay

## 2023-09-28 NOTE — Telephone Encounter (Signed)
 Overdue on metformin, outreach unsuccessful. Will try again next week.

## 2023-10-03 ENCOUNTER — Telehealth: Payer: Self-pay

## 2023-10-03 NOTE — Telephone Encounter (Signed)
 Overdue on metformin, unsuccessful outreach. Will try again next week

## 2023-10-08 ENCOUNTER — Telehealth: Payer: Self-pay

## 2023-10-08 DIAGNOSIS — G4733 Obstructive sleep apnea (adult) (pediatric): Secondary | ICD-10-CM | POA: Diagnosis not present

## 2023-10-08 NOTE — Telephone Encounter (Signed)
 Patient reports oversupply of atorvastatin  and metformin, will check back in August to review fill history and encouraged additional fills.

## 2024-01-07 ENCOUNTER — Encounter: Payer: Self-pay | Admitting: Nurse Practitioner

## 2024-01-07 ENCOUNTER — Ambulatory Visit: Admitting: Nurse Practitioner

## 2024-01-07 VITALS — BP 118/62 | HR 78 | Ht 73.0 in | Wt 343.4 lb

## 2024-01-07 DIAGNOSIS — E039 Hypothyroidism, unspecified: Secondary | ICD-10-CM

## 2024-01-07 DIAGNOSIS — E119 Type 2 diabetes mellitus without complications: Secondary | ICD-10-CM | POA: Diagnosis not present

## 2024-01-07 DIAGNOSIS — Z7984 Long term (current) use of oral hypoglycemic drugs: Secondary | ICD-10-CM | POA: Diagnosis not present

## 2024-01-07 DIAGNOSIS — Z794 Long term (current) use of insulin: Secondary | ICD-10-CM | POA: Diagnosis not present

## 2024-01-07 DIAGNOSIS — Z7985 Long-term (current) use of injectable non-insulin antidiabetic drugs: Secondary | ICD-10-CM | POA: Diagnosis not present

## 2024-01-07 DIAGNOSIS — I10 Essential (primary) hypertension: Secondary | ICD-10-CM

## 2024-01-07 DIAGNOSIS — E782 Mixed hyperlipidemia: Secondary | ICD-10-CM

## 2024-01-07 LAB — POCT GLYCOSYLATED HEMOGLOBIN (HGB A1C): Hemoglobin A1C: 6.4 % — AB (ref 4.0–5.6)

## 2024-01-07 NOTE — Progress Notes (Signed)
 Endocrinology Follow Up Note       01/07/2024, 11:45 AM   Subjective:    Patient ID: Ronald Heath, male    DOB: 09-Apr-1945.  Ronald Heath is being seen in follow up after being seen in consultation for management of currently uncontrolled symptomatic diabetes requested by  Shona Norleen PEDLAR, MD.  He transferred care from his previous Endocrinologist Dr. Mirna who retired.   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   40-45%, mod conc LVH   Hyperlipidemia    Hypertension    Hypothyroidism    Shortness of breath    with exertion   Sleep apnea    pt is supposed to wear CPAP, but doesn't wear it    Past Surgical History:  Procedure Laterality Date   AMPUTATION Left 01/01/2019   Procedure: AMPUTATION LEFT INDEX FINGER;  Surgeon: Sebastian Lenis, MD;  Location: [facility] OR;  Service: Orthopedics;  Laterality: Left;   CATARACT EXTRACTION W/PHACO Right 11/21/2012   Procedure: CATARACT EXTRACTION PHACO AND INTRAOCULAR LENS PLACEMENT (IOC);  Surgeon: Cherene Mania, MD;  Location: [facility];  Service: Ophthalmology;  Laterality: Right;  CDE:19.67   CATARACT EXTRACTION W/PHACO Left 12/19/2012   Procedure: CATARACT EXTRACTION PHACO AND INTRAOCULAR LENS PLACEMENT (IOC);  Surgeon: Cherene Mania, MD;  Location: [facility];  Service: Ophthalmology;  Laterality: Left;  CDE:8.00   KNEE ARTHROSCOPY Left    ROTATOR CUFF REPAIR Right     Social History   Socioeconomic History   Marital status: Widowed    Spouse name: Not on file   Number of children: Not on file   Years of  education: Not on file   Highest education level: Not on file  Occupational History   Not on file  Tobacco Use   Smoking status: Former    Current packs/day: 2.00    Average packs/day: 2.0 packs/day for 15.0 years (30.0 ttl pk-yrs)    Types: Cigarettes   Smokeless tobacco: Never  Vaping Use   Vaping status: Never Used  Substance and Sexual Activity   Alcohol use: Yes    Alcohol/week: 2.0 standard drinks of alcohol    Types: 2 Cans of beer per week   Drug use: No   Sexual activity: Not on file  Other Topics Concern   Not on file  Social History Narrative   Not on file   Social Drivers of Health   Financial Resource Strain: Not on file  Food Insecurity: Not on file  Transportation Needs: Not on file  Physical Activity: Not on file  Stress: Not on file  Social Connections: Not on file    Family History  Problem Relation Age of Onset   Diabetes Mellitus II Father    Stroke Father     Outpatient Encounter Medications as of 01/07/2024  Medication Sig  albuterol (VENTOLIN HFA) 108 (90 Base) MCG/ACT inhaler 2 Puff(s) By Mouth Every 4 Hours PRN   aspirin  EC 81 MG tablet Take 81 mg by mouth daily.   atorvastatin  (LIPITOR) 20 MG tablet Take 20 mg by mouth daily.   azelastine (ASTELIN) 0.1 % nasal spray Place 2 sprays into both nostrils 2 (two) times daily.   carvedilol  (COREG ) 3.125 MG tablet TAKE ONE TABLET BY MOUTH TWICE DAILY.   dapagliflozin  propanediol (FARXIGA ) 10 MG TABS tablet Take 1 tablet (10 mg total) by mouth daily.   glucose blood test strip 1 each by Other route 4 (four) times daily. Use as instructed   hydrALAZINE  (APRESOLINE ) 50 MG tablet Take 50 mg by mouth 3 (three) times daily.   insulin  glargine (LANTUS ) 100 UNIT/ML injection Inject 50 Units into the skin daily.   levothyroxine  (SYNTHROID , LEVOTHROID) 137 MCG tablet Take 137 mcg by mouth daily before breakfast.   metFORMIN (GLUCOPHAGE-XR) 500 MG 24 hr tablet Take 500 mg by mouth 2 (two) times daily with a  meal.   OneTouch Delica Lancets 30G MISC 1 each by Does not apply route 4 (four) times daily.   PARoxetine  (PAXIL ) 40 MG tablet Take 20 mg by mouth every morning.   pramipexole  (MIRAPEX ) 1 MG tablet Take 1 mg by mouth at bedtime.   sacubitril -valsartan  (ENTRESTO ) 97-103 MG Take 1 tablet by mouth 2 (two) times daily.   Semaglutide , 2 MG/DOSE, 8 MG/3ML SOPN Inject 2 mg as directed once a week.   vitamin B-12 (CYANOCOBALAMIN ) 1000 MCG tablet Take 1,000 mcg by mouth daily.   No facility-administered encounter medications on file as of 01/07/2024.    ALLERGIES: No Known Allergies  VACCINATION STATUS: Immunization History  Administered Date(s) Administered   INFLUENZA, HIGH DOSE SEASONAL PF 01/16/2018   Influenza-Unspecified 12/19/2019   Moderna Sars-Covid-2 Vaccination 05/31/2019, 06/21/2019, 02/19/2020   Tdap 12/30/2018    Diabetes He presents for his follow-up diabetic visit. He has type 2 diabetes mellitus. Onset time: Diagnosed at approx age of 51. His disease course has been stable. There are no hypoglycemic associated symptoms. Pertinent negatives for hypoglycemia include no nervousness/anxiousness or tremors. Associated symptoms include foot paresthesias. Pertinent negatives for diabetes include no fatigue, no polydipsia, no polyuria and no weight loss. There are no hypoglycemic complications. Symptoms are stable. Diabetic complications include nephropathy and peripheral neuropathy. Risk factors for coronary artery disease include diabetes mellitus, dyslipidemia, family history, hypertension, male sex, obesity and sedentary lifestyle. Current diabetic treatment includes intensive insulin  program and oral agent (dual therapy) (and Ozempic ). He is compliant with treatment most of the time. His weight is fluctuating minimally. He is following a generally healthy diet. When asked about meal planning, he reported none. He has not had a previous visit with a dietitian. He never participates in  exercise. His home blood glucose trend is decreasing steadily. His breakfast blood glucose range is generally 70-90 mg/dl. (He presents today with his meter showing tight fasting glycemic profile with frequent mild hypoglycemia.  His POCT A1c today is 6.4%, improving from last visit of 7.4%.  Analysis of his meter shows 7-day average of 90, 14-day average of 120, 30-day average of 114, 90-day average of 107.  He has been more active recently, is bummed he has not lost more weight than he has. ) An ACE inhibitor/angiotensin II receptor blocker is being taken. He sees a podiatrist.Eye exam is current.  Thyroid  Problem Presents for follow-up visit. Patient reports no anxiety, cold intolerance, constipation, depressed mood, fatigue, heat  intolerance, leg swelling, tremors, weight gain or weight loss. The symptoms have been stable.     Review of systems  Constitutional: +decreasing body weight, current Body mass index is 45.31 kg/m.,  no subjective hyperthermia, no subjective hypothermia Eyes: no blurry vision, no xerophthalmia ENT: no sore throat, no nodules palpated in throat, no dysphagia/odynophagia, no hoarseness Cardiovascular: no chest pain, no shortness of breath, no palpitations, + ankle swelling Respiratory: no cough, no shortness of breath Gastrointestinal: no nausea/vomiting/diarrhea Musculoskeletal: right knee pain (needs TKR) Skin: no rashes, no hyperemia Neurological: no tremors, no numbness, no tingling, no dizziness Psychiatric: no depression, no anxiety  Objective:     BP 118/62 (BP Location: Left Arm, Patient Position: Sitting, Cuff Size: Large)   Pulse 78   Ht 6' 1 (1.854 m)   Wt (!) 343 lb 6.4 oz (155.8 kg)   BMI 45.31 kg/m   Wt Readings from Last 3 Encounters:  01/07/24 (!) 343 lb 6.4 oz (155.8 kg)  09/05/23 (!) 350 lb 9.6 oz (159 kg)  07/12/23 (!) 312 lb 6.4 oz (141.7 kg)     BP Readings from Last 3 Encounters:  01/07/24 118/62  09/05/23 138/68  07/12/23  126/62      Physical Exam- Limited  Constitutional:  Body mass index is 45.31 kg/m. , not in acute distress, normal state of mind Eyes:  EOMI, no exophthalmos Musculoskeletal: no gross deformities, strength intact in all four extremities, no gross restriction of joint movements Skin:  no rashes, no hyperemia Neurological: no tremor with outstretched hands   Diabetic Foot Exam - Simple   Simple Foot Form Visual Inspection See comments: Yes Sensation Testing See comments: Yes Pulse Check See comments: Yes Comments Onychomycosis bilaterally, pulses diminished in left foot, no sensation to monofilament tool to left foot, missing great toenail left foot      CMP ( most recent) CMP     Component Value Date/Time   NA 139 12/14/2022 0957   K 5.2 12/14/2022 0957   CL 103 12/14/2022 0957   CO2 24 12/14/2022 0957   GLUCOSE 96 12/14/2022 0957   GLUCOSE 131 (H) 05/24/2022 1226   BUN 21 12/14/2022 0957   CREATININE 1.37 (H) 12/14/2022 0957   CREATININE 1.19 (H) 11/03/2019 1035   CALCIUM  9.4 12/14/2022 0957   CALCIUM  8.4 10/13/2021 0000   PROT 6.5 12/14/2022 0957   ALBUMIN 3.7 (L) 12/14/2022 0957   AST 17 12/14/2022 0957   ALT 14 12/14/2022 0957   ALKPHOS 71 12/14/2022 0957   BILITOT 0.3 12/14/2022 0957   GFRNONAA >60 05/24/2022 1226   GFRNONAA 60 11/03/2019 1035   GFRAA 70 11/03/2019 1035     Diabetic Labs (most recent): Lab Results  Component Value Date   HGBA1C 6.4 (A) 01/07/2024   HGBA1C 7.2 (A) 12/21/2022   HGBA1C 7.0 (A) 12/13/2021     Lipid Panel ( most recent) Lipid Panel     Component Value Date/Time   CHOL 91 (L) 08/11/2021 0825   TRIG 59 08/11/2021 0825   HDL 41 08/11/2021 0825   CHOLHDL 2.2 08/11/2021 0825   LDLCALC 36 08/11/2021 0825   LABVLDL 14 08/11/2021 0825      Lab Results  Component Value Date   TSH 1.200 12/14/2022   TSH 1.36 10/13/2021   TSH 1.340 08/11/2021   TSH 1.440 01/03/2021   FREET4 1.51 12/14/2022   FREET4 1.42  10/13/2021   FREET4 1.46 08/11/2021   FREET4 1.27 01/03/2021  Assessment & Plan:   1) Diabetes mellitus without complication (HCC)  He presents today with his meter showing tight fasting glycemic profile with frequent mild hypoglycemia.  His POCT A1c today is 6.4%, improving from last visit of 7.4%.  Analysis of his meter shows 7-day average of 90, 14-day average of 120, 30-day average of 114, 90-day average of 107.  He has been more active recently, is bummed he has not lost more weight than he has.  - COMMODORE BELLEW has currently uncontrolled symptomatic type 2 DM since 78 years of age.  -Recent labs reviewed.  - I had a long discussion with him about the progressive nature of diabetes and the pathology behind its complications. 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 These are all discussed in detail with him.  - Nutritional counseling repeated/built upon at each appointment.  - The patient admits there is a room for improvement in their diet and drink choices. -  Suggestion is made for the patient to avoid simple carbohydrates from their diet including Cakes, Sweet Desserts / Pastries, Ice Cream, Soda (diet and regular), Sweet Tea, Candies, Chips, Cookies, Sweet Pastries, Store Bought Juices, Alcohol in Excess of 1-2 drinks a day, Artificial Sweeteners, Coffee Creamer, and Sugar-free Products. This will help patient to have stable blood glucose profile and potentially avoid unintended weight gain.   - I encouraged the patient to switch to unprocessed or minimally processed complex starch and increased protein intake (animal or plant source), fruits, and vegetables.   - Patient is advised to stick to a routine mealtimes to eat 3 meals a day and avoid unnecessary snacks (to snack only to correct hypoglycemia).  The following  Lifestyle Medicine recommendations according to American College of Lifestyle Medicine  [facility]) were discussed and and offered to patient and he  agrees to start the journey:  A. Whole Foods, Plant-Based Nutrition comprising of fruits and vegetables, plant-based proteins, whole-grain carbohydrates was discussed in detail with the patient.   A list for source of those nutrients were also provided to the patient.  Patient will use only water  or unsweetened tea for hydration. B.  The need to stay away from risky substances including alcohol, smoking; obtaining 7 to 9 hours of restorative sleep, at least 150 minutes of moderate intensity exercise weekly, the importance of healthy social connections,  and stress management techniques were discussed. C.  A full color page of  Calorie density of various food groups per pound showing examples of each food groups was provided to the patient.  - I have approached him with the following individualized plan to manage his diabetes and patient agrees:   -He is advised to lower his Lantus  to 35 units SQ nightly, continue Ozempic  2 mg SQ weekly, Farxiga  10 mg po daily and Metformin 500 mg po twice daily.  This may aide in attempts to lose more weight.  He gets his insulin  and Ozempic  from PAP with PCP.  -he is encouraged to continue monitoring glucose 4 times daily, before meals and before bed, and to call the clinic if he has readings less than 70 or greater than 200 for 3 tests in a row.  He could greatly benefit from CGM device, especially since he has random hypoglycemia.  He received his CGM from [employer], but has not started wearing it, prefers to stick to traditional  fingersticks.   - he is warned not to take insulin  without proper monitoring per orders.  - Adjustment parameters are given to him for hypo and hyperglycemia in writing.  - Specific targets for  A1c; LDL, HDL, and Triglycerides were discussed with the patient.  2) Blood Pressure  /Hypertension: -His blood pressure is controlled to target.  he is advised to continue his current medications as prescribed by PCP.   3) Lipids/Hyperlipidemia:    Review of his recent lipid panel from 07/18/23 showed controlled LDL at 44 and slightly elevated triglycerides of 154.  He is advised to continue Lipitor 20 mg p.o. daily at bedtime.  Side effects and precautions discussed with him.  4)  Weight/Diet:  his Body mass index is 45.31 kg/m.  -  clearly complicating his diabetes care.  he is a candidate for weight loss. I discussed with him the fact that loss of 5 - 10% of his  current body weight will have the most impact on his diabetes management.  Exercise, and detailed carbohydrates information provided  -  detailed on discharge instructions.    5) Hypothyroidism- unspecified There are no recent TFTs to review.  He is advised to continue Levothyroxine  137 mcg p.o. daily before breakfast.  Will recheck TFTs prior to next visit and adjust dose accordingly.  - The correct intake of thyroid  hormone (Levothyroxine , Synthroid ), is on empty stomach first thing in the morning, with water , separated by at least 30 minutes from breakfast and other medications,  and separated by more than 4 hours from calcium , iron, multivitamins, acid reflux medications (PPIs).  - This medication is a life-long medication and will be needed to correct thyroid  hormone imbalances for the rest of your life.  The dose may change from time to time, based on thyroid  blood work.  - It is extremely important to be consistent taking this medication, near the same time each morning.  -AVOID TAKING PRODUCTS CONTAINING BIOTIN (commonly found in Hair, Skin, Nails vitamins) AS IT INTERFERES WITH THE VALIDITY OF THYROID  FUNCTION BLOOD TESTS.  6) Chronic Care/Health Maintenance: -he is on ACEI/ARB and Statin medications and is encouraged to initiate and continue to follow up with Ophthalmology, Dentist, Podiatrist at least  yearly or according to recommendations, and advised to stay away from smoking. I have recommended yearly flu vaccine and pneumonia vaccine at least every 5 years; moderate intensity exercise for up to 150 minutes weekly; and sleep for at least 7 hours a day.  - he is advised to maintain close follow up with Shona Norleen PEDLAR, MD for primary care needs, as well as his other providers for optimal and coordinated care.      I spent  35  minutes in the care of the patient today including review of labs from CMP, Lipids, Thyroid  Function, Hematology (current and previous including abstractions from other facilities); face-to-face time discussing  his blood glucose readings/logs, discussing hypoglycemia and hyperglycemia episodes and symptoms, medications doses, his options of short and long term treatment based on the latest standards of care / guidelines;  discussion about incorporating lifestyle medicine;  and documenting the encounter. Risk reduction counseling performed per USPSTF guidelines to reduce obesity and cardiovascular risk factors.     Please refer to Patient Instructions for Blood Glucose Monitoring and Insulin /Medications Dosing Guide  in media tab for additional information. Please  also refer to  Patient Self Inventory in the Media  tab for reviewed elements of pertinent patient history.  Elsie JINNY Keel  participated in the discussions, expressed understanding, and voiced agreement with the above plans.  All questions were answered to his satisfaction. he is encouraged to contact clinic should he have any questions or concerns prior to his return visit.   Follow up plan: - Return in about 4 months (around 05/09/2024) for Diabetes F/U with A1c in office, Thyroid  follow up, Previsit labs, Bring meter and logs.   Benton Rio, [facility] [address] [place], [place] [address] Phone: [phone] Fax: [phone]  01/07/2024, 11:45  AM

## 2024-01-21 DIAGNOSIS — D519 Vitamin B12 deficiency anemia, unspecified: Secondary | ICD-10-CM | POA: Diagnosis not present

## 2024-01-21 DIAGNOSIS — E039 Hypothyroidism, unspecified: Secondary | ICD-10-CM | POA: Diagnosis not present

## 2024-01-21 DIAGNOSIS — E1165 Type 2 diabetes mellitus with hyperglycemia: Secondary | ICD-10-CM | POA: Diagnosis not present

## 2024-01-21 DIAGNOSIS — I10 Essential (primary) hypertension: Secondary | ICD-10-CM | POA: Diagnosis not present

## 2024-01-25 DIAGNOSIS — E039 Hypothyroidism, unspecified: Secondary | ICD-10-CM | POA: Diagnosis not present

## 2024-01-25 DIAGNOSIS — I5042 Chronic combined systolic (congestive) and diastolic (congestive) heart failure: Secondary | ICD-10-CM | POA: Diagnosis not present

## 2024-01-25 DIAGNOSIS — Z23 Encounter for immunization: Secondary | ICD-10-CM | POA: Diagnosis not present

## 2024-01-25 DIAGNOSIS — I130 Hypertensive heart and chronic kidney disease with heart failure and stage 1 through stage 4 chronic kidney disease, or unspecified chronic kidney disease: Secondary | ICD-10-CM | POA: Diagnosis not present

## 2024-01-25 DIAGNOSIS — E782 Mixed hyperlipidemia: Secondary | ICD-10-CM | POA: Diagnosis not present

## 2024-01-25 DIAGNOSIS — Z0001 Encounter for general adult medical examination with abnormal findings: Secondary | ICD-10-CM | POA: Diagnosis not present

## 2024-01-25 DIAGNOSIS — I10 Essential (primary) hypertension: Secondary | ICD-10-CM | POA: Diagnosis not present

## 2024-01-25 DIAGNOSIS — E1165 Type 2 diabetes mellitus with hyperglycemia: Secondary | ICD-10-CM | POA: Diagnosis not present

## 2024-01-25 DIAGNOSIS — Z0000 Encounter for general adult medical examination without abnormal findings: Secondary | ICD-10-CM | POA: Diagnosis not present

## 2024-01-25 DIAGNOSIS — R809 Proteinuria, unspecified: Secondary | ICD-10-CM | POA: Diagnosis not present

## 2024-05-12 ENCOUNTER — Ambulatory Visit: Admitting: Nurse Practitioner
